# Patient Record
Sex: Male | Born: 1960
Health system: Southern US, Community
[De-identification: ages and names within clinical notes are randomized; demographics above are authoritative.]

## PROBLEM LIST (undated history)

## (undated) DIAGNOSIS — N200 Calculus of kidney: Secondary | ICD-10-CM

## (undated) DIAGNOSIS — I10 Essential (primary) hypertension: Secondary | ICD-10-CM

## (undated) DIAGNOSIS — N390 Urinary tract infection, site not specified: Secondary | ICD-10-CM

## (undated) DIAGNOSIS — N189 Chronic kidney disease, unspecified: Secondary | ICD-10-CM

## (undated) DIAGNOSIS — J069 Acute upper respiratory infection, unspecified: Secondary | ICD-10-CM

## (undated) DIAGNOSIS — K5790 Diverticulosis of intestine, part unspecified, without perforation or abscess without bleeding: Secondary | ICD-10-CM

## (undated) DIAGNOSIS — M5030 Other cervical disc degeneration, unspecified cervical region: Secondary | ICD-10-CM

## (undated) DIAGNOSIS — G8929 Other chronic pain: Secondary | ICD-10-CM

## (undated) DIAGNOSIS — E785 Hyperlipidemia, unspecified: Secondary | ICD-10-CM

## (undated) DIAGNOSIS — M549 Dorsalgia, unspecified: Secondary | ICD-10-CM

## (undated) HISTORY — PX: KNEE ARTHROSCOPY: SUR90

## (undated) HISTORY — DX: Acute upper respiratory infection, unspecified: J06.9

## (undated) HISTORY — PX: OTHER SURGICAL HISTORY: SHX169

## (undated) HISTORY — DX: Hyperlipidemia, unspecified: E78.5

## (undated) HISTORY — DX: Other cervical disc degeneration, unspecified cervical region: M50.30

## (undated) HISTORY — DX: Diverticulosis of intestine, part unspecified, without perforation or abscess without bleeding: K57.90

## (undated) HISTORY — DX: Essential (primary) hypertension: I10

---

## 1998-07-21 ENCOUNTER — Encounter: Payer: Self-pay | Admitting: *Deleted

## 1998-07-23 ENCOUNTER — Encounter: Payer: Self-pay | Admitting: *Deleted

## 1998-07-23 ENCOUNTER — Ambulatory Visit (HOSPITAL_COMMUNITY): Admission: RE | Admit: 1998-07-23 | Discharge: 1998-07-24 | Payer: Self-pay | Admitting: *Deleted

## 1998-09-03 ENCOUNTER — Encounter: Admission: RE | Admit: 1998-09-03 | Discharge: 1998-09-19 | Payer: Self-pay | Admitting: *Deleted

## 2002-03-12 HISTORY — PX: BACK SURGERY: SHX140

## 2004-07-06 ENCOUNTER — Encounter: Admission: RE | Admit: 2004-07-06 | Discharge: 2004-08-24 | Payer: Self-pay | Admitting: Orthopedic Surgery

## 2009-08-04 ENCOUNTER — Encounter (INDEPENDENT_AMBULATORY_CARE_PROVIDER_SITE_OTHER): Payer: Self-pay | Admitting: *Deleted

## 2009-09-18 ENCOUNTER — Encounter (INDEPENDENT_AMBULATORY_CARE_PROVIDER_SITE_OTHER): Payer: Self-pay | Admitting: *Deleted

## 2009-09-19 ENCOUNTER — Ambulatory Visit: Payer: Self-pay | Admitting: Gastroenterology

## 2009-09-19 ENCOUNTER — Encounter (INDEPENDENT_AMBULATORY_CARE_PROVIDER_SITE_OTHER): Payer: Self-pay | Admitting: *Deleted

## 2009-10-03 ENCOUNTER — Ambulatory Visit: Payer: Self-pay | Admitting: Gastroenterology

## 2009-10-08 ENCOUNTER — Encounter: Payer: Self-pay | Admitting: Gastroenterology

## 2010-02-10 NOTE — Letter (Signed)
Summary: Patient Notice-Hyperplastic Polyps  [facility]  [address] [place], [place] [address]   Phone: [phone]  Fax: [phone]        October 08, 2009 MRN: [id number]    Cameron Noble [address] [place], [place]  [address]    Dear [name],  I am pleased to inform you that the colon polyp(s) removed during your recent colonoscopy was (were) found to be hyperplastic. These types of polyps are NOT pre-cancerous.  It is my recommendation that you have a repeat colonoscopy examination in 10 years for routine colorectal cancer screening.  Should you develop new or worsening symptoms of abdominal pain, bowel habit changes or bleeding from the rectum or bowels, please schedule an evaluation with either your primary care physician or with me.  Continue treatment plan as outlined the day of your exam.  Please call us if you are having persistent problems or have questions about your condition that have not been fully answered at this time.  Sincerely,  Meryl Dare MD [facility]  This letter has been electronically signed by your physician.  Appended Document: Patient Notice-Hyperplastic Polyps letter mailed

## 2010-02-10 NOTE — Letter (Signed)
Summary: Diabetic Instructions  [facility]  [address] [place], [place] [address]   Phone: [phone]  Fax: [phone]    Cameron Noble 12-Dec-1960 MRN: [id number]   (METFORMIN)  ORAL DIABETIC MEDICATION INSTRUCTIONS  The day before your procedure:   Take your diabetic pill as you do normally  The day of your procedure:   Do not take your diabetic pill    We will check your blood sugar levels during the admission process and again in Recovery before discharging you home  ________________________________________________________________________

## 2010-02-10 NOTE — Miscellaneous (Signed)
Summary: [facility]  Clinical Lists Changes  Medications: Added new medication of MOVIPREP 100 GM  SOLR (PEG-KCL-NACL-NASULF-NA ASC-C) As per prep instructions. - Signed Rx of MOVIPREP 100 GM  SOLR (PEG-KCL-NACL-NASULF-NA ASC-C) As per prep instructions.;  #1 x 0;  Signed;  Entered by: Ezra Sites RN;  Authorized by: Meryl Dare MD Clementeen Graham;  Method used: Electronically to [facility] [phone]*, [address], [place], [place], [place]  [address], Ph: [phone] or [phone], Fax: [phone] Observations: Added new observation of NKA: T (09/19/2009 7:55)    Prescriptions: MOVIPREP 100 GM  SOLR (PEG-KCL-NACL-NASULF-NA ASC-C) As per prep instructions.  #1 x 0   Entered by:   Ezra Sites RN   Authorized by:   Meryl Dare MD [facility]   Signed by:   Ezra Sites RN on 09/19/2009   Method used:   Electronically to        [facility] [phone]* (retail)       [address]       [place], [place]  [address]       Ph: [phone] or [phone]       Fax: [phone]   RxID:   [id number]

## 2010-02-10 NOTE — Procedures (Signed)
Summary: Colonoscopy  Patient: Cameron Noble Note: All result statuses are Final unless otherwise noted.  Tests: (1) Colonoscopy (COL)   COL Colonoscopy           DONE     [facility]     [address]     [place], [place]  [address]           COLONOSCOPY PROCEDURE REPORT     PATIENT:  Cameron Noble, Cameron Noble  MR#:  [id number]     BIRTHDATE:  [birth year], 49 yrs. old  GENDER:  male     ENDOSCOPIST:  Judie Petit T. Russella Dar, MD, [facility]     Referred by:  Rudi Heap, M.D.     PROCEDURE DATE:  10/03/2009     PROCEDURE:  Colonoscopy with biopsy     ASA CLASS:  Class II     INDICATIONS:  1) Routine Risk Screening     MEDICATIONS:   Fentanyl 75 mcg IV, Versed 9 mg IV     DESCRIPTION OF PROCEDURE:   After the risks benefits and     alternatives of the procedure were thoroughly explained, informed     consent was obtained.  Digital rectal exam was performed and     revealed no abnormalities.   The LB PCF-[id number] endoscope     was introduced through the anus and advanced to the cecum, which     was identified by both the appendix and ileocecal valve, without     limitations.  The quality of the prep was excellent, using     MoviPrep.  The instrument was then slowly withdrawn as the colon     was fully examined.     <<PROCEDUREIMAGES>>     FINDINGS:  Mild diverticulosis was found in the ascending colon.     Mild diverticulosis was found in the sigmoid colon. A 4 mm sessile     polyp was found in the mid transverse colon and removed by cold     biopsy.  A normal appearing cecum, ileocecal valve, and     appendiceal orifice were identified. The hepatic flexure, splenic     flexure, descending colon, and rectum appeared unremarkable.     Retroflexed views in the rectum revealed no abnormalities.  The     time to cecum = 2.75  minutes. The scope was then withdrawn (time     = 10  min) from the patient and the procedure completed.           COMPLICATIONS:  None           ENDOSCOPIC  IMPRESSION:     1) Mild diverticulosis in the ascending colon     2) Mild diverticulosis in the sigmoid colon     3) 4mm transverse colon polyp           RECOMMENDATIONS:     1) Await pathology results     2) High fiber diet with liberal fluid intake.     3) If the polyp removed today is adenomatous (pre-cancerous),     you will need a repeat colonoscopy in 5 years. Otherwise follow     colorectal cancer screening for "routine risk" patients with     colonoscopy in 10 years.           Venita Lick. Russella Dar, MD, [facility]           n.     eSIGNED:   Venita Lick. Stark at 10/03/2009 02:02 PM  Leslee, [place], [id number]  Note: An exclamation mark (!) indicates a result that was not dispersed into the flowsheet. Document Creation Date: 10/03/2009 2:01 PM _______________________________________________________________________  (1) Order result status: Final Collection or observation date-time: 10/03/2009 13:55 Requested date-time:  Receipt date-time:  Reported date-time:  Referring Physician:   Ordering Physician: Claudette Head [phone]) Specimen Source:  Source: Launa Grill Order Number: [id number] Lab site:   Appended Document: Colonoscopy     Procedures Next Due Date:    Colonoscopy: 09/2019

## 2010-02-10 NOTE — Letter (Signed)
Summary: [facility] Instructions  [facility]  [address] [place], [place] [address]   Phone: [phone]  Fax: [phone]       Cameron Noble    [birth year]    MRN: [id number]        Procedure Day /Date:  Friday 10/03/2009     Arrival Time: 12:30 pm      Procedure Time: 1:30 pm     Location of Procedure:                    _x _  [facility] (4th Floor)                        PREPARATION FOR COLONOSCOPY WITH MOVIPREP   Starting 5 days prior to your procedure Sunday 9/18 do not eat nuts, seeds, popcorn, corn, beans, peas,  salads, or any raw vegetables.  Do not take any fiber supplements (e.g. Metamucil, Citrucel, and Benefiber).  THE DAY BEFORE YOUR PROCEDURE         DATE: Thursday 9/22  1.  Drink clear liquids the entire day-NO SOLID FOOD  2.  Do not drink anything colored red or purple.  Avoid juices with pulp.  No orange juice.  3.  Drink at least 64 oz. (8 glasses) of fluid/clear liquids during the day to prevent dehydration and help the prep work efficiently.  CLEAR LIQUIDS INCLUDE: Water Jello Ice Popsicles Tea (sugar ok, no milk/cream) Powdered fruit flavored drinks Coffee (sugar ok, no milk/cream) Gatorade Juice: apple, white grape, white cranberry  Lemonade Clear bullion, consomm, broth Carbonated beverages (any kind) Strained chicken noodle soup Hard Candy                             4.  In the morning, mix first dose of MoviPrep solution:    Empty 1 Pouch A and 1 Pouch B into the disposable container    Add lukewarm drinking water to the top line of the container. Mix to dissolve    Refrigerate (mixed solution should be used within 24 hrs)  5.  Begin drinking the prep at 5:00 p.m. The MoviPrep container is divided by 4 marks.   Every 15 minutes drink the solution down to the next mark (approximately 8 oz) until the full liter is complete.   6.  Follow completed prep with 16 oz of clear liquid of your choice (Nothing  red or purple).  Continue to drink clear liquids until bedtime.  7.  Before going to bed, mix second dose of MoviPrep solution:    Empty 1 Pouch A and 1 Pouch B into the disposable container    Add lukewarm drinking water to the top line of the container. Mix to dissolve    Refrigerate  THE DAY OF YOUR PROCEDURE      DATE: Friday 9/23  Beginning at 8:30 a.m. (5 hours before procedure):         1. Every 15 minutes, drink the solution down to the next mark (approx 8 oz) until the full liter is complete.  2. Follow completed prep with 16 oz. of clear liquid of your choice.    3. You may drink clear liquids until 11:30 am (2 HOURS BEFORE PROCEDURE).   MEDICATION INSTRUCTIONS  Unless otherwise instructed, you should take regular prescription medications with a small sip of water   as early as possible the morning of  your procedure.  Diabetic patients - see separate instructions.         OTHER INSTRUCTIONS  You will need a responsible adult at least 52 years of age to accompany you and drive you home.   This person must remain in the waiting room during your procedure.  Wear loose fitting clothing that is easily removed.  Leave jewelry and other valuables at home.  However, you may wish to bring a book to read or  an iPod/MP3 player to listen to music as you wait for your procedure to start.  Remove all body piercing jewelry and leave at home.  Total time from sign-in until discharge is approximately 2-3 hours.  You should go home directly after your procedure and rest.  You can resume normal activities the  day after your procedure.  The day of your procedure you should not:   Drive   Make legal decisions   Operate machinery   Drink alcohol   Return to work  You will receive specific instructions about eating, activities and medications before you leave.    The above instructions have been reviewed and explained to me by   Ezra Sites RN  September 19, 2009 8:16 AM    I fully understand and can verbalize these instructions _____________________________ Date _________

## 2010-02-10 NOTE — Letter (Signed)
Summary: Previsit letter  [name]  [address] [name], [place] [address]   Phone: [phone]  Fax: [phone]       08/04/2009 MRN: [id number]  Cameron Noble [address] [place], [place]  [address]  Dear Mr. [name],  Welcome to the Gastroenterology Division at [facility]    You are scheduled to see a nurse for your pre-procedure visit on 09-04-09 at 8:30A.M.  on the 3rd floor at [facility], [address]  We ask that you try to arrive at our office 15 minutes prior to your appointment time to allow for check-in.  Your nurse visit will consist of discussing your medical and surgical history, your immediate family medical history, and your medications.    Please bring a complete list of all your medications or, if you prefer, bring the medication bottles and we will list them.  We will need to be aware of both prescribed and over the counter drugs.  We will need to know exact dosage information as well.  If you are on blood thinners (Coumadin, Plavix, Aggrenox, Ticlid, etc.) please call our office today/prior to your appointment, as we need to consult with your physician about holding your medication.   Please be prepared to read and sign documents such as consent forms, a financial agreement, and acknowledgement forms.  If necessary, and with your consent, a friend or relative is welcome to sit-in on the nurse visit with you.  Please bring your insurance card so that we may make a copy of it.  If your insurance requires a referral to see a specialist, please bring your referral form from your primary care physician.  No co-pay is required for this nurse visit.     If you cannot keep your appointment, please call [phone] to cancel or reschedule prior to your appointment date.  This allows [place] the opportunity to schedule an appointment for another patient in need of care.    Thank you for choosing [facility] for your medical needs.   We appreciate the opportunity to care for you.  Please visit [place] at our website  to learn more about our practice.                     Sincerely.                                                                                                                   The Gastroenterology Division

## 2010-03-26 LAB — GLUCOSE, CAPILLARY
Glucose-Capillary: 106 mg/dL — ABNORMAL HIGH (ref 70–99)
Glucose-Capillary: 67 mg/dL — ABNORMAL LOW (ref 70–99)
Glucose-Capillary: 77 mg/dL (ref 70–99)

## 2010-06-16 ENCOUNTER — Encounter: Payer: Self-pay | Admitting: Physician Assistant

## 2012-05-04 ENCOUNTER — Encounter: Payer: Self-pay | Admitting: Nurse Practitioner

## 2012-05-04 ENCOUNTER — Ambulatory Visit (INDEPENDENT_AMBULATORY_CARE_PROVIDER_SITE_OTHER): Payer: BC Managed Care – PPO | Admitting: Nurse Practitioner

## 2012-05-04 VITALS — BP 117/79 | HR 62 | Temp 98.4°F | Ht 68.0 in | Wt 202.0 lb

## 2012-05-04 DIAGNOSIS — E785 Hyperlipidemia, unspecified: Secondary | ICD-10-CM

## 2012-05-04 DIAGNOSIS — E782 Mixed hyperlipidemia: Secondary | ICD-10-CM | POA: Insufficient documentation

## 2012-05-04 DIAGNOSIS — E119 Type 2 diabetes mellitus without complications: Secondary | ICD-10-CM

## 2012-05-04 DIAGNOSIS — I10 Essential (primary) hypertension: Secondary | ICD-10-CM | POA: Insufficient documentation

## 2012-05-04 DIAGNOSIS — B86 Scabies: Secondary | ICD-10-CM

## 2012-05-04 LAB — COMPLETE METABOLIC PANEL WITH GFR
CO2: 29 mEq/L (ref 19–32)
Creat: 0.98 mg/dL (ref 0.50–1.35)
GFR, Est African American: >89 mL/min
GFR, Est Non African American: 87 mL/min
Glucose, Bld: 99 mg/dL (ref 70–99)
Total Bilirubin: 0.4 mg/dL (ref 0.3–1.2)

## 2012-05-04 LAB — POCT GLYCOSYLATED HEMOGLOBIN (HGB A1C): Hemoglobin A1C: 5.6

## 2012-05-04 MED ORDER — LISINOPRIL 10 MG PO TABS: 10.0000 mg | ORAL_TABLET | Freq: Every day | ORAL | Status: DC

## 2012-05-04 MED ORDER — PERMETHRIN 5 % EX CREA: TOPICAL_CREAM | Freq: Once | CUTANEOUS | Status: DC

## 2012-05-04 MED ORDER — METFORMIN HCL 1000 MG PO TABS: 1000.0000 mg | ORAL_TABLET | Freq: Two times a day (BID) | ORAL | Status: DC

## 2012-05-04 MED ORDER — ATORVASTATIN CALCIUM 10 MG PO TABS: 10.0000 mg | ORAL_TABLET | Freq: Every day | ORAL | Status: DC

## 2012-05-04 NOTE — Patient Instructions (Addendum)
Health Maintenance, Males A healthy lifestyle and preventative care can promote health and wellness.  Maintain regular health, dental, and eye exams.  Eat a healthy diet. Foods like vegetables, fruits, whole grains, low-fat dairy products, and lean protein foods contain the nutrients you need without too many calories. Decrease your intake of foods high in solid fats, added sugars, and salt. Get information about a proper diet from your caregiver, if necessary.  Regular physical exercise is one of the most important things you can do for your health. Most adults should get at least 150 minutes of moderate-intensity exercise (any activity that increases your heart rate and causes you to sweat) each week. In addition, most adults need muscle-strengthening exercises on 2 or more days a week.   Maintain a healthy weight. The body mass index (BMI) is a screening tool to identify possible weight problems. It provides an estimate of body fat based on height and weight. Your caregiver can help determine your BMI, and can help you achieve or maintain a healthy weight. For adults 20 years and older:  A BMI below 18.5 is considered underweight.  A BMI of 18.5 to 24.9 is normal.  A BMI of 25 to 29.9 is considered overweight.  A BMI of 30 and above is considered obese.  Maintain normal blood lipids and cholesterol by exercising and minimizing your intake of saturated fat. Eat a balanced diet with plenty of fruits and vegetables. Blood tests for lipids and cholesterol should begin at age 20 and be repeated every 5 years. If your lipid or cholesterol levels are high, you are over 50, or you are a high risk for heart disease, you may need your cholesterol levels checked more frequently.Ongoing high lipid and cholesterol levels should be treated with medicines, if diet and exercise are not effective.  If you smoke, find out from your caregiver how to quit. If you do not use tobacco, do not start.  If you  choose to drink alcohol, do not exceed 2 drinks per day. One drink is considered to be 12 ounces (355 mL) of beer, 5 ounces (148 mL) of wine, or 1.5 ounces (44 mL) of liquor.  Avoid use of street drugs. Do not share needles with anyone. Ask for help if you need support or instructions about stopping the use of drugs.  High blood pressure causes heart disease and increases the risk of stroke. Blood pressure should be checked at least every 1 to 2 years. Ongoing high blood pressure should be treated with medicines if weight loss and exercise are not effective.  If you are 45 to 54 years old, ask your caregiver if you should take aspirin to prevent heart disease.  Diabetes screening involves taking a blood sample to check your fasting blood sugar level. This should be done once every 3 years, after age 45, if you are within normal weight and without risk factors for diabetes. Testing should be considered at a younger age or be carried out more frequently if you are overweight and have at least 1 risk factor for diabetes.  Colorectal cancer can be detected and often prevented. Most routine colorectal cancer screening begins at the age of 50 and continues through age 75. However, your caregiver may recommend screening at an earlier age if you have risk factors for colon cancer. On a yearly basis, your caregiver may provide home test kits to check for hidden blood in the stool. Use of a small camera at the end of a tube,   to directly examine the colon (sigmoidoscopy or colonoscopy), can detect the earliest forms of colorectal cancer. Talk to your caregiver about this at age 72, when routine screening begins. Direct examination of the colon should be repeated every 5 to 10 years through age 54, unless early forms of pre-cancerous polyps or small growths are found.  Hepatitis C blood testing is recommended for all people born from [birth year] through 1967 and any individual with known risks for hepatitis C.  Healthy  men should no longer receive prostate-specific antigen (PSA) blood tests as part of routine cancer screening. Consult with your caregiver about prostate cancer screening.  Testicular cancer screening is not recommended for adolescents or adult males who have no symptoms. Screening includes self-exam, caregiver exam, and other screening tests. Consult with your caregiver about any symptoms you have or any concerns you have about testicular cancer.  Practice safe sex. Use condoms and avoid high-risk sexual practices to reduce the spread of sexually transmitted infections (STIs).  Use sunscreen with a sun protection factor (SPF) of 30 or greater. Apply sunscreen liberally and repeatedly throughout the day. You should seek shade when your shadow is shorter than you. Protect yourself by wearing long sleeves, pants, a wide-brimmed hat, and sunglasses year round, whenever you are outdoors.  Notify your caregiver of new moles or changes in moles, especially if there is a change in shape or color. Also notify your caregiver if a mole is larger than the size of a pencil eraser.  A one-time screening for abdominal aortic aneurysm (AAA) and surgical repair of large AAAs by sound wave imaging (ultrasonography) is recommended for ages 3 to 43 years who are current or former smokers.  Stay current with your immunizations. Document Released: 06/26/2007 Document Revised: 03/22/2011 Document Reviewed: 05/25/2010 [facility] Patient Information 2013 [place], [place]. Scabies Scabies are small bugs (mites) that burrow under the skin and cause red bumps and severe itching. These bugs can only be seen with a microscope. Scabies are highly contagious. They can spread easily from person to person by direct contact. They are also spread through sharing clothing or linens that have the scabies mites living in them. It is not unusual for an entire family to become infected through shared towels, clothing, or bedding.  HOME CARE  INSTRUCTIONS   Your caregiver may prescribe a cream or lotion to kill the mites. If cream is prescribed, massage the cream into the entire body from the neck to the bottom of both feet. Also massage the cream into the scalp and face if your child is less than 30 year old. Avoid the eyes and mouth. Do not wash your hands after application.  Leave the cream on for 8 to 12 hours. Your child should bathe or shower after the 8 to 12 hour application period. Sometimes it is helpful to apply the cream to your child right before bedtime.  One treatment is usually effective and will eliminate approximately 95% of infestations. For severe cases, your caregiver may decide to repeat the treatment in 1 week. Everyone in your household should be treated with one application of the cream.  New rashes or burrows should not appear within 24 to 48 hours after successful treatment. However, the itching and rash may last for 2 to 4 weeks after successful treatment. Your caregiver may prescribe a medicine to help with the itching or to help the rash go away more quickly.  Scabies can live on clothing or linens for up to 3 days. All of  your child's recently used clothing, towels, stuffed toys, and bed linens should be washed in hot water and then dried in a dryer for at least 20 minutes on high heat. Items that cannot be washed should be enclosed in a plastic bag for at least 3 days.  To help relieve itching, bathe your child in a cool bath or apply cool washcloths to the affected areas.  Your child may return to school after treatment with the prescribed cream. SEEK MEDICAL CARE IF:   The itching persists longer than 4 weeks after treatment.  The rash spreads or becomes infected. Signs of infection include red blisters or yellow-tan crust. Document Released: 12/28/2004 Document Revised: 03/22/2011 Document Reviewed: 05/08/2008 [facility] Patient Information 2013 [place], [place].

## 2012-05-04 NOTE — Progress Notes (Deleted)
  Subjective:    Patient ID: Cameron Noble, male    DOB: 11-09-1960, 52 y.o.   MRN: [id number]  Hypertension  Diabetes  Hyperlipidemia      Review of Systems     Objective:   Physical Exam        Assessment & Plan:

## 2012-05-04 NOTE — Progress Notes (Signed)
Subjective:    Patient ID: Cameron Noble, male    DOB: Aug 21, 1960, 52 y.o.   MRN: [id number]  Hypertension This is a chronic problem. The current episode started more than 1 year ago. The problem has been gradually improving since onset. The problem is controlled. Pertinent negatives include no chest pain, headaches, palpitations, peripheral edema or shortness of breath. Risk factors for coronary artery disease include dyslipidemia, diabetes mellitus and male gender. Past treatments include ACE inhibitors. The current treatment provides significant improvement. There are no compliance problems.   Diabetes He has type 2 diabetes mellitus. His disease course has been stable. There are no hypoglycemic associated symptoms. Pertinent negatives for hypoglycemia include no headaches. Pertinent negatives for diabetes include no chest pain, no foot paresthesias and no visual change. There are no hypoglycemic complications. Symptoms are stable. There are no diabetic complications. Risk factors for coronary artery disease include dyslipidemia, male sex and hypertension. Current diabetic treatment includes oral agent (monotherapy). He is compliant with treatment all of the time. His weight is stable. He is following a generally healthy diet. When asked about meal planning, he reported none. He participates in exercise three times a week. An ACE inhibitor/angiotensin II receptor blocker is being taken. Eye exam is current.  Hyperlipidemia This is a chronic problem. The current episode started more than 1 year ago. The problem is controlled. Recent lipid tests were reviewed and are normal. Exacerbating diseases include diabetes. Pertinent negatives include no chest pain, leg pain, myalgias or shortness of breath. Current antihyperlipidemic treatment includes statins. The current treatment provides significant improvement of lipids. There are no compliance problems.  Risk factors for coronary artery disease include  diabetes mellitus, hypertension and male sex.      Review of Systems  Respiratory: Negative for shortness of breath.   Cardiovascular: Negative for chest pain and palpitations.  Musculoskeletal: Negative for myalgias.  Neurological: Negative for headaches.  All other systems reviewed and are negative.       Objective:   Physical Exam  Constitutional: He is oriented to person, place, and time. He appears well-developed and well-nourished.  HENT:  Head: Normocephalic.  Right Ear: External ear normal.  Left Ear: External ear normal.  Eyes: Conjunctivae are normal. Pupils are equal, round, and reactive to light.  Neck: Normal range of motion. Neck supple. No JVD present.  Cardiovascular: Normal rate, regular rhythm and normal heart sounds.   No murmur heard. Pulmonary/Chest: Effort normal and breath sounds normal.  Abdominal: Soft. Bowel sounds are normal.  Musculoskeletal: Normal range of motion.  Neurological: He is alert and oriented to person, place, and time.  Skin: Skin is warm and dry.  +4/4 monofilament bil No edema    Psychiatric: He has a normal mood and affect. His behavior is normal. Judgment and thought content normal.   Erythematous, rash in linear pattern between fingers and toes  BP 117/79  Pulse 62  Temp(Src) 98.4 F (36.9 C) (Oral)  Ht 5\' 8"  (1.727 m)  Wt 202 lb (91.627 kg)  BMI 30.72 kg/m2   Results for orders placed in visit on 05/04/12  POCT GLYCOSYLATED HEMOGLOBIN (HGB A1C)      Result Value Range   Hemoglobin A1C 5.6            Assessment & Plan:  1. Essential hypertension, benign Low Na+ Exercise - COMPLETE METABOLIC PANEL WITH GFR - lisinopril (PRINIVIL,ZESTRIL) 10 MG tablet; Take 1 tablet (10 mg total) by mouth daily.  Dispense: 90 tablet; Refill: 1  2. Other and unspecified hyperlipidemia Low fat diet  - NMR Lipoprofile with Lipids - atorvastatin (LIPITOR) 10 MG tablet; Take 1 tablet (10 mg total) by mouth daily.  Dispense: 90  tablet; Refill: 1  3. Type II or unspecified type diabetes mellitus without mention of complication, not stated as uncontrolled Low carb diet Exercise Check feet daily - Hemoglobin A1c - metFORMIN (GLUCOPHAGE) 1000 MG tablet; Take 1 tablet (1,000 mg total) by mouth 2 (two) times daily with a meal.  Dispense: 180 tablet; Refill: 1  4. Scabies Avoid scratching Wash sheets in bed Permethrin 0.1% cream  Mary-Margaret Daphine Deutscher, FNP

## 2012-05-05 LAB — NMR LIPOPROFILE WITH LIPIDS
HDL Size: 8.9 nm — ABNORMAL LOW (ref >=9.2)
HDL-C: 38 mg/dL — ABNORMAL LOW (ref >=40)
LDL (calc): 40 mg/dL (ref <100)
LDL Size: 19.9 nm — ABNORMAL LOW (ref >20.5)
LP-IR Score: 50 — ABNORMAL HIGH (ref <=45)

## 2012-08-15 ENCOUNTER — Ambulatory Visit: Payer: BC Managed Care – PPO | Admitting: Nurse Practitioner

## 2012-09-01 ENCOUNTER — Ambulatory Visit (INDEPENDENT_AMBULATORY_CARE_PROVIDER_SITE_OTHER): Payer: BC Managed Care – PPO | Admitting: Nurse Practitioner

## 2012-09-01 ENCOUNTER — Encounter: Payer: Self-pay | Admitting: Nurse Practitioner

## 2012-09-01 VITALS — BP 103/70 | HR 58 | Temp 96.8°F | Ht 68.0 in | Wt 201.0 lb

## 2012-09-01 DIAGNOSIS — E119 Type 2 diabetes mellitus without complications: Secondary | ICD-10-CM

## 2012-09-01 DIAGNOSIS — I10 Essential (primary) hypertension: Secondary | ICD-10-CM

## 2012-09-01 DIAGNOSIS — E785 Hyperlipidemia, unspecified: Secondary | ICD-10-CM

## 2012-09-01 LAB — POCT GLYCOSYLATED HEMOGLOBIN (HGB A1C): Hemoglobin A1C: 5.8

## 2012-09-01 LAB — POCT UA - MICROALBUMIN: Microalbumin Ur, POC: NEGATIVE mg/L

## 2012-09-01 NOTE — Patient Instructions (Signed)

## 2012-09-01 NOTE — Progress Notes (Signed)
Subjective:    Patient ID: Cameron Noble, male    DOB: [birth year], 52 y.o.   MRN: [id number]  Hypertension This is a chronic problem. The current episode started more than 1 year ago. The problem has been gradually improving since onset. The problem is controlled. Pertinent negatives include no chest pain, headaches, palpitations, peripheral edema or shortness of breath. Risk factors for coronary artery disease include dyslipidemia, diabetes mellitus and male gender. Past treatments include ACE inhibitors. The current treatment provides significant improvement. There are no compliance problems.   Diabetes He has type 2 diabetes mellitus. His disease course has been stable. There are no hypoglycemic associated symptoms. Pertinent negatives for hypoglycemia include no headaches. Pertinent negatives for diabetes include no chest pain, no foot paresthesias and no visual change. There are no hypoglycemic complications. Symptoms are stable. There are no diabetic complications. Risk factors for coronary artery disease include dyslipidemia, male sex and hypertension. Current diabetic treatment includes oral agent (monotherapy). He is compliant with treatment all of the time. His weight is stable. He is following a generally healthy diet. When asked about meal planning, he reported none. He participates in exercise three times a week. (Patient doesn't check blood sugars at home) An ACE inhibitor/angiotensin II receptor blocker is being taken. Eye exam is current.  Hyperlipidemia This is a chronic problem. The current episode started more than 1 year ago. The problem is controlled. Recent lipid tests were reviewed and are normal. Exacerbating diseases include diabetes. Pertinent negatives include no chest pain, leg pain, myalgias or shortness of breath. Current antihyperlipidemic treatment includes statins. The current treatment provides significant improvement of lipids. There are no compliance problems.  Risk  factors for coronary artery disease include diabetes mellitus, hypertension and male sex.      Review of Systems  Respiratory: Negative for shortness of breath.   Cardiovascular: Negative for chest pain and palpitations.  Musculoskeletal: Negative for myalgias.  Neurological: Negative for headaches.  All other systems reviewed and are negative.       Objective:   Physical Exam  Constitutional: He is oriented to person, place, and time. He appears well-developed and well-nourished.  HENT:  Head: Normocephalic.  Right Ear: External ear normal.  Left Ear: External ear normal.  Eyes: Conjunctivae are normal. Pupils are equal, round, and reactive to light.  Neck: Normal range of motion. Neck supple. No JVD present.  Cardiovascular: Normal rate, regular rhythm and normal heart sounds.   No murmur heard. Pulmonary/Chest: Effort normal and breath sounds normal.  Abdominal: Soft. Bowel sounds are normal.  Musculoskeletal: Normal range of motion.  Neurological: He is alert and oriented to person, place, and time.  Skin: Skin is warm and dry.  +4/4 monofilament bil No edema    Psychiatric: He has a normal mood and affect. His behavior is normal. Judgment and thought content normal.   Erythematous, rash in linear pattern between fingers and toes  BP 103/70  Pulse 58  Temp(Src) 96.8 F (36 C) (Oral)  Ht 5\' 8"  (1.727 m)  Wt 201 lb (91.173 kg)  BMI 30.57 kg/m2  Results for orders placed in visit on 09/01/12  POCT GLYCOSYLATED HEMOGLOBIN (HGB A1C)      Result Value Range   Hemoglobin A1C 5.8%         Assessment & Plan:  1. Essential hypertension, benign Low Na+ Exercise - COMPLETE METABOLIC PANEL WITH GFR  2. Other and unspecified hyperlipidemia Low fat diet  - NMR Lipoprofile with Lipids  -3. Type  II or unspecified type diabetes mellitus without mention of complication, not stated as uncontrolled Low carb diet Exercise Check feet daily - Hemoglobin  A1c   Mary-Margaret Daphine Deutscher, FNP

## 2012-09-03 LAB — CMP14+EGFR
Albumin/Globulin Ratio: 1.6 (ref 1.1–2.5)
Albumin: 4 g/dL (ref 3.5–5.5)
Alkaline Phosphatase: 76 IU/L (ref 39–117)
BUN/Creatinine Ratio: 15 (ref 9–20)
BUN: 17 mg/dL (ref 6–24)
Chloride: 104 mmol/L (ref 97–108)
Creatinine, Ser: 1.1 mg/dL (ref 0.76–1.27)
GFR calc Af Amer: 88 mL/min/{1.73_m2} (ref >59)
GFR calc non Af Amer: 76 mL/min/{1.73_m2} (ref >59)
Globulin, Total: 2.5 g/dL (ref 1.5–4.5)
Glucose: 89 mg/dL (ref 65–99)
Total Bilirubin: 0.6 mg/dL (ref 0.0–1.2)
Total Protein: 6.5 g/dL (ref 6.0–8.5)

## 2012-09-03 LAB — NMR, LIPOPROFILE
HDL Cholesterol by NMR: 40 mg/dL (ref >=40)
LDL Particle Number: 902 nmol/L (ref <1000)
LDLC SERPL CALC-MCNC: 48 mg/dL (ref <100)
Triglycerides by NMR: 38 mg/dL (ref <150)

## 2012-12-04 ENCOUNTER — Ambulatory Visit: Payer: BC Managed Care – PPO | Admitting: Nurse Practitioner

## 2012-12-19 ENCOUNTER — Other Ambulatory Visit: Payer: Self-pay | Admitting: Nurse Practitioner

## 2012-12-27 ENCOUNTER — Encounter: Payer: Self-pay | Admitting: Nurse Practitioner

## 2012-12-27 ENCOUNTER — Ambulatory Visit (INDEPENDENT_AMBULATORY_CARE_PROVIDER_SITE_OTHER): Payer: BC Managed Care – PPO | Admitting: Nurse Practitioner

## 2012-12-27 VITALS — BP 105/65 | HR 69 | Temp 99.1°F | Ht 68.0 in | Wt 203.0 lb

## 2012-12-27 DIAGNOSIS — E785 Hyperlipidemia, unspecified: Secondary | ICD-10-CM

## 2012-12-27 DIAGNOSIS — I10 Essential (primary) hypertension: Secondary | ICD-10-CM

## 2012-12-27 DIAGNOSIS — Z23 Encounter for immunization: Secondary | ICD-10-CM

## 2012-12-27 DIAGNOSIS — E119 Type 2 diabetes mellitus without complications: Secondary | ICD-10-CM

## 2012-12-27 LAB — POCT GLYCOSYLATED HEMOGLOBIN (HGB A1C): Hemoglobin A1C: 5.2

## 2012-12-27 MED ORDER — LISINOPRIL 10 MG PO TABS: 10.0000 mg | ORAL_TABLET | Freq: Every day | ORAL | Status: DC

## 2012-12-27 NOTE — Patient Instructions (Signed)
Diabetes and Foot Care Diabetes may cause you to have problems because of poor blood supply (circulation) to your feet and legs. This may cause the skin on your feet to become thinner, break easier, and heal more slowly. Your skin may become dry, and the skin may peel and crack. You may also have nerve damage in your legs and feet causing decreased feeling in them. You may not notice minor injuries to your feet that could lead to infections or more serious problems. Taking care of your feet is one of the most important things you can do for yourself.  HOME CARE INSTRUCTIONS  Wear shoes at all times, even in the house. Do not go barefoot. Bare feet are easily injured.  Check your feet daily for blisters, cuts, and redness. If you cannot see the bottom of your feet, use a mirror or ask someone for help.  Wash your feet with warm water (do not use hot water) and mild soap. Then pat your feet and the areas between your toes until they are completely dry. Do not soak your feet as this can dry your skin.  Apply a moisturizing lotion or petroleum jelly (that does not contain alcohol and is unscented) to the skin on your feet and to dry, brittle toenails. Do not apply lotion between your toes.  Trim your toenails straight across. Do not dig under them or around the cuticle. File the edges of your nails with an emery board or nail file.  Do not cut corns or calluses or try to remove them with medicine.  Wear clean socks or stockings every day. Make sure they are not too tight. Do not wear knee-high stockings since they may decrease blood flow to your legs.  Wear shoes that fit properly and have enough cushioning. To break in new shoes, wear them for just a few hours a day. This prevents you from injuring your feet. Always look in your shoes before you put them on to be sure there are no objects inside.  Do not cross your legs. This may decrease the blood flow to your feet.  If you find a minor scrape,  cut, or break in the skin on your feet, keep it and the skin around it clean and dry. These areas may be cleansed with mild soap and water. Do not cleanse the area with peroxide, alcohol, or iodine.  When you remove an adhesive bandage, be sure not to damage the skin around it.  If you have a wound, look at it several times a day to make sure it is healing.  Do not use heating pads or hot water bottles. They may burn your skin. If you have lost feeling in your feet or legs, you may not know it is happening until it is too late.  Make sure your health care provider performs a complete foot exam at least annually or more often if you have foot problems. Report any cuts, sores, or bruises to your health care provider immediately. SEEK MEDICAL CARE IF:   You have an injury that is not healing.  You have cuts or breaks in the skin.  You have an ingrown nail.  You notice redness on your legs or feet.  You feel burning or tingling in your legs or feet.  You have pain or cramps in your legs and feet.  Your legs or feet are numb.  Your feet always feel cold. SEEK IMMEDIATE MEDICAL CARE IF:   There is increasing redness,   swelling, or pain in or around a wound.  There is a red line that goes up your leg.  Pus is coming from a wound.  You develop a fever or as directed by your health care provider.  You notice a bad smell coming from an ulcer or wound. Document Released: 12/26/1999 Document Revised: 08/30/2012 Document Reviewed: 06/06/2012 [employer] Patient Information 2014 [employer].  

## 2012-12-27 NOTE — Progress Notes (Signed)
Subjective:    Patient ID: Cameron Noble, male    DOB: [birth year], 52 y.o.   MRN: [id number]  Patient in today for follow up of chronic medical problems- no changes since last visit.- no complaints today.  Hypertension This is a chronic problem. The current episode started more than 1 year ago. The problem has been gradually improving since onset. The problem is controlled. Pertinent negatives include no chest pain, headaches, palpitations, peripheral edema or shortness of breath. Risk factors for coronary artery disease include dyslipidemia, diabetes mellitus and male gender. Past treatments include ACE inhibitors. The current treatment provides significant improvement. There are no compliance problems.   Diabetes He has type 2 diabetes mellitus. His disease course has been stable. There are no hypoglycemic associated symptoms. Pertinent negatives for hypoglycemia include no headaches. Pertinent negatives for diabetes include no chest pain, no foot paresthesias and no visual change. There are no hypoglycemic complications. Symptoms are stable. There are no diabetic complications. Risk factors for coronary artery disease include dyslipidemia, male sex and hypertension. Current diabetic treatment includes oral agent (monotherapy). He is compliant with treatment all of the time. His weight is stable. He is following a generally healthy diet. When asked about meal planning, he reported none. He participates in exercise three times a week. (Patient doesn't check blood sugars at home )  An ACE inhibitor/angiotensin II receptor blocker is being taken. Eye exam is current.  Hyperlipidemia This is a chronic problem. The current episode started more than 1 year ago. The problem is controlled. Recent lipid tests were reviewed and are normal. Exacerbating diseases include diabetes. Pertinent negatives include no chest pain, leg pain, myalgias or shortness of breath. Current antihyperlipidemic treatment includes  statins. The current treatment provides significant improvement of lipids. There are no compliance problems.  Risk factors for coronary artery disease include diabetes mellitus, hypertension and male sex.      Review of Systems  Respiratory: Negative for shortness of breath.   Cardiovascular: Negative for chest pain and palpitations.  Musculoskeletal: Negative for myalgias.  Neurological: Negative for headaches.  All other systems reviewed and are negative.       Objective:   Physical Exam  Constitutional: He is oriented to person, place, and time. He appears well-developed and well-nourished.  HENT:  Head: Normocephalic.  Right Ear: External ear normal.  Left Ear: External ear normal.  Eyes: Conjunctivae are normal. Pupils are equal, round, and reactive to light.  Neck: Normal range of motion. Neck supple. No JVD present.  Cardiovascular: Normal rate, regular rhythm and normal heart sounds.   No murmur heard. Pulmonary/Chest: Effort normal and breath sounds normal.  Abdominal: Soft. Bowel sounds are normal.  Musculoskeletal: Normal range of motion.  Neurological: He is alert and oriented to person, place, and time.  Skin: Skin is warm and dry.  +4/4 monofilament bil No edema    Psychiatric: He has a normal mood and affect. His behavior is normal. Judgment and thought content normal.    BP 105/65  Pulse 69  Temp(Src) 99.1 F (37.3 C) (Oral)  Ht 5\' 8"  (1.727 m)  Wt 203 lb (92.08 kg)  BMI 30.87 kg/m2 Results for orders placed in visit on 12/27/12  POCT GLYCOSYLATED HEMOGLOBIN (HGB A1C)      Result Value Range   Hemoglobin A1C 5.2%         Assessment & Plan:   1. Essential hypertension, benign   2. Other and unspecified hyperlipidemia   3. Type II or unspecified type  diabetes mellitus without mention of complication, not stated as uncontrolled   4. Need for pneumococcal vaccination    Orders Placed This Encounter  Procedures  . CMP14+EGFR  . NMR, lipoprofile   . POCT glycosylated hemoglobin (Hb A1C)     Continue all meds Labs pending Diet and exercise encouraged Health maintenance reviewed Follow up in 3 months Pneumonia vaccine today  Mary-Margaret Daphine Deutscher, FNP

## 2012-12-29 LAB — CMP14+EGFR
ALT: 19 IU/L (ref 0–44)
Albumin/Globulin Ratio: 1.8 (ref 1.1–2.5)
BUN/Creatinine Ratio: 16 (ref 9–20)
CO2: 22 mmol/L (ref 18–29)
Chloride: 103 mmol/L (ref 97–108)
GFR calc Af Amer: 74 mL/min/{1.73_m2} (ref >59)
GFR calc non Af Amer: 64 mL/min/{1.73_m2} (ref >59)
Glucose: 94 mg/dL (ref 65–99)
Potassium: 4.6 mmol/L (ref 3.5–5.2)
Total Bilirubin: 0.4 mg/dL (ref 0.0–1.2)
Total Protein: 6.5 g/dL (ref 6.0–8.5)

## 2012-12-29 LAB — NMR, LIPOPROFILE
LDL Particle Number: 476 nmol/L (ref <1000)
LDL Size: 21.4 nm (ref >20.5)
LDLC SERPL CALC-MCNC: 44 mg/dL (ref <100)
LP-IR Score: 54 — ABNORMAL HIGH (ref <=45)
Small LDL Particle Number: 352 nmol/L (ref <=527)
Triglycerides by NMR: 67 mg/dL (ref <150)

## 2013-03-19 ENCOUNTER — Other Ambulatory Visit: Payer: Self-pay | Admitting: Nurse Practitioner

## 2013-03-28 ENCOUNTER — Ambulatory Visit: Payer: BC Managed Care – PPO | Admitting: Nurse Practitioner

## 2013-04-23 ENCOUNTER — Ambulatory Visit (INDEPENDENT_AMBULATORY_CARE_PROVIDER_SITE_OTHER): Payer: BC Managed Care – PPO | Admitting: Nurse Practitioner

## 2013-04-23 ENCOUNTER — Encounter: Payer: Self-pay | Admitting: Nurse Practitioner

## 2013-04-23 VITALS — BP 104/71 | HR 71 | Temp 97.7°F | Ht 68.0 in | Wt 195.2 lb

## 2013-04-23 DIAGNOSIS — E119 Type 2 diabetes mellitus without complications: Secondary | ICD-10-CM

## 2013-04-23 DIAGNOSIS — I10 Essential (primary) hypertension: Secondary | ICD-10-CM

## 2013-04-23 DIAGNOSIS — E785 Hyperlipidemia, unspecified: Secondary | ICD-10-CM

## 2013-04-23 LAB — POCT GLYCOSYLATED HEMOGLOBIN (HGB A1C): Hemoglobin A1C: 5.6

## 2013-04-23 MED ORDER — ATORVASTATIN CALCIUM 10 MG PO TABS: ORAL_TABLET | ORAL | Status: DC

## 2013-04-23 MED ORDER — FLUTICASONE PROPIONATE 50 MCG/ACT NA SUSP: 2.0000 | Freq: Every day | NASAL | Status: DC

## 2013-04-23 MED ORDER — LISINOPRIL 10 MG PO TABS: 10.0000 mg | ORAL_TABLET | Freq: Every day | ORAL | Status: DC

## 2013-04-23 MED ORDER — METFORMIN HCL 1000 MG PO TABS: 1000.0000 mg | ORAL_TABLET | Freq: Two times a day (BID) | ORAL | Status: DC

## 2013-04-23 NOTE — Patient Instructions (Signed)
Diabetes and Foot Care Diabetes may cause you to have problems because of poor blood supply (circulation) to your feet and legs. This may cause the skin on your feet to become thinner, break easier, and heal more slowly. Your skin may become dry, and the skin may peel and crack. You may also have nerve damage in your legs and feet causing decreased feeling in them. You may not notice minor injuries to your feet that could lead to infections or more serious problems. Taking care of your feet is one of the most important things you can do for yourself.  HOME CARE INSTRUCTIONS  Wear shoes at all times, even in the house. Do not go barefoot. Bare feet are easily injured.  Check your feet daily for blisters, cuts, and redness. If you cannot see the bottom of your feet, use a mirror or ask someone for help.  Wash your feet with warm water (do not use hot water) and mild soap. Then pat your feet and the areas between your toes until they are completely dry. Do not soak your feet as this can dry your skin.  Apply a moisturizing lotion or petroleum jelly (that does not contain alcohol and is unscented) to the skin on your feet and to dry, brittle toenails. Do not apply lotion between your toes.  Trim your toenails straight across. Do not dig under them or around the cuticle. File the edges of your nails with an emery board or nail file.  Do not cut corns or calluses or try to remove them with medicine.  Wear clean socks or stockings every day. Make sure they are not too tight. Do not wear knee-high stockings since they may decrease blood flow to your legs.  Wear shoes that fit properly and have enough cushioning. To break in new shoes, wear them for just a few hours a day. This prevents you from injuring your feet. Always look in your shoes before you put them on to be sure there are no objects inside.  Do not cross your legs. This may decrease the blood flow to your feet.  If you find a minor scrape,  cut, or break in the skin on your feet, keep it and the skin around it clean and dry. These areas may be cleansed with mild soap and water. Do not cleanse the area with peroxide, alcohol, or iodine.  When you remove an adhesive bandage, be sure not to damage the skin around it.  If you have a wound, look at it several times a day to make sure it is healing.  Do not use heating pads or hot water bottles. They may burn your skin. If you have lost feeling in your feet or legs, you may not know it is happening until it is too late.  Make sure your health care provider performs a complete foot exam at least annually or more often if you have foot problems. Report any cuts, sores, or bruises to your health care provider immediately. SEEK MEDICAL CARE IF:   You have an injury that is not healing.  You have cuts or breaks in the skin.  You have an ingrown nail.  You notice redness on your legs or feet.  You feel burning or tingling in your legs or feet.  You have pain or cramps in your legs and feet.  Your legs or feet are numb.  Your feet always feel cold. SEEK IMMEDIATE MEDICAL CARE IF:   There is increasing redness,   swelling, or pain in or around a wound.  There is a red line that goes up your leg.  Pus is coming from a wound.  You develop a fever or as directed by your health care provider.  You notice a bad smell coming from an ulcer or wound. Document Released: 12/26/1999 Document Revised: 08/30/2012 Document Reviewed: 06/06/2012 [employer] Patient Information 2014 [employer].  

## 2013-04-23 NOTE — Progress Notes (Signed)
Subjective:    Patient ID: Cameron Noble, male    DOB: [birth year], 53 y.o.   MRN: [id number]  Patient here today for follow up of chronic medical problems.  Diabetes He has type 2 diabetes mellitus. His disease course has been stable. There are no hypoglycemic associated symptoms. Pertinent negatives for hypoglycemia include no headaches. Pertinent negatives for diabetes include no chest pain, no foot paresthesias and no visual change. There are no hypoglycemic complications. Symptoms are stable. There are no diabetic complications. Risk factors for coronary artery disease include dyslipidemia, male sex and hypertension. Current diabetic treatment includes oral agent (monotherapy). He is compliant with treatment all of the time. His weight is stable. He is following a generally healthy diet. When asked about meal planning, he reported none. He participates in exercise three times a week. (Patient doesn't check blood sugars at home) An ACE inhibitor/angiotensin II receptor blocker is being taken. Eye exam is current.  Hypertension This is a chronic problem. The current episode started more than 1 year ago. The problem has been gradually improving since onset. The problem is controlled. Pertinent negatives include no chest pain, headaches, palpitations, peripheral edema or shortness of breath. Risk factors for coronary artery disease include dyslipidemia, diabetes mellitus and male gender. Past treatments include ACE inhibitors. The current treatment provides significant improvement. There are no compliance problems.   Hyperlipidemia This is a chronic problem. The current episode started more than 1 year ago. The problem is controlled. Recent lipid tests were reviewed and are normal. Exacerbating diseases include diabetes. Pertinent negatives include no chest pain, leg pain, myalgias or shortness of breath. Current antihyperlipidemic treatment includes statins. The current treatment provides significant  improvement of lipids. There are no compliance problems.  Risk factors for coronary artery disease include diabetes mellitus, hypertension and male sex.      Review of Systems  Respiratory: Negative for shortness of breath.   Cardiovascular: Negative for chest pain and palpitations.  Musculoskeletal: Negative for myalgias.  Neurological: Negative for headaches.  All other systems reviewed and are negative.      Objective:   Physical Exam  Constitutional: He is oriented to person, place, and time. He appears well-developed and well-nourished.  HENT:  Head: Normocephalic.  Right Ear: External ear normal.  Left Ear: External ear normal.  Eyes: Conjunctivae are normal. Pupils are equal, round, and reactive to light.  Neck: Normal range of motion. Neck supple. No JVD present.  Cardiovascular: Normal rate, regular rhythm and normal heart sounds.   No murmur heard. Pulmonary/Chest: Effort normal and breath sounds normal.  Abdominal: Soft. Bowel sounds are normal.  Musculoskeletal: Normal range of motion.  Neurological: He is alert and oriented to person, place, and time.  Skin: Skin is warm and dry.  +4/4 monofilament bil No edema    Psychiatric: He has a normal mood and affect. His behavior is normal. Judgment and thought content normal.   BP 104/71  Pulse 71  Temp(Src) 97.7 F (36.5 C) (Oral)  Ht 5' 8"  (1.727 m)  Wt 195 lb 3.2 oz (88.542 kg)  BMI 29.69 kg/m2   Results for orders placed in visit on 04/23/13  POCT GLYCOSYLATED HEMOGLOBIN (HGB A1C)      Result Value Ref Range   Hemoglobin A1C 5.6%          Assessment & Plan:   1. Hypertension   2. Hyperlipidemia   3. Type II or unspecified type diabetes mellitus without mention of complication, not stated as uncontrolled  4. Other and unspecified hyperlipidemia   5. Essential hypertension, benign    Orders Placed This Encounter  Procedures  . CMP14+EGFR  . NMR, lipoprofile  . POCT glycosylated hemoglobin (Hb  A1C)   Meds ordered this encounter  Medications  . lisinopril (PRINIVIL,ZESTRIL) 10 MG tablet    Sig: Take 1 tablet (10 mg total) by mouth daily.    Dispense:  90 tablet    Refill:  1    Order Specific Question:  Supervising Provider    Answer:  Chipper Herb [[id number]]  . metFORMIN (GLUCOPHAGE) 1000 MG tablet    Sig: Take 1 tablet (1,000 mg total) by mouth 2 (two) times daily with a meal.    Dispense:  180 tablet    Refill:  1    Order Specific Question:  Supervising Provider    Answer:  Chipper Herb [[id number]]  . atorvastatin (LIPITOR) 10 MG tablet    Sig: TAKE 1 TABLET DAILY    Dispense:  90 tablet    Refill:  1    Order Specific Question:  Supervising Provider    Answer:  Chipper Herb [[id number]]    Labs pending Health maintenance reviewed Diet and exercise encouraged Continue all meds Follow up  In 3 months   [place], FNP

## 2013-04-24 LAB — CMP14+EGFR
A/G RATIO: 1.7 (ref 1.1–2.5)
ALK PHOS: 83 IU/L (ref 39–117)
ALT: 16 IU/L (ref 0–44)
AST: 21 IU/L (ref 0–40)
Albumin: 4.3 g/dL (ref 3.5–5.5)
BILIRUBIN TOTAL: 0.5 mg/dL (ref 0.0–1.2)
BUN / CREAT RATIO: 14 (ref 9–20)
BUN: 15 mg/dL (ref 6–24)
CO2: 25 mmol/L (ref 18–29)
CREATININE: 1.06 mg/dL (ref 0.76–1.27)
Calcium: 9.6 mg/dL (ref 8.7–10.2)
Chloride: 101 mmol/L (ref 97–108)
GFR, EST AFRICAN AMERICAN: 91 mL/min/{1.73_m2} (ref >59)
GFR, EST NON AFRICAN AMERICAN: 79 mL/min/{1.73_m2} (ref >59)
GLOBULIN, TOTAL: 2.5 g/dL (ref 1.5–4.5)
Glucose: 85 mg/dL (ref 65–99)
Potassium: 4.3 mmol/L (ref 3.5–5.2)
SODIUM: 142 mmol/L (ref 134–144)
Total Protein: 6.8 g/dL (ref 6.0–8.5)

## 2013-04-24 LAB — NMR, LIPOPROFILE
Cholesterol: 90 mg/dL (ref <200)
HDL Cholesterol by NMR: 39 mg/dL — ABNORMAL LOW (ref >=40)
HDL PARTICLE NUMBER: 29.5 umol/L — AB (ref >=30.5)
LDL Particle Number: 482 nmol/L (ref <1000)
LDL SIZE: 19.9 nm (ref >20.5)
LDLC SERPL CALC-MCNC: 43 mg/dL (ref <100)
LP-IR SCORE: 54 — AB (ref <=45)
SMALL LDL PARTICLE NUMBER: 327 nmol/L (ref <=527)
Triglycerides by NMR: 42 mg/dL (ref <150)

## 2013-04-26 ENCOUNTER — Telehealth: Payer: Self-pay | Admitting: *Deleted

## 2013-04-26 NOTE — Telephone Encounter (Signed)
Details left on home vm.  Call us for any questions.

## 2013-04-26 NOTE — Telephone Encounter (Signed)
Message copied by Shelbie Ammons on Thu Apr 26, 2013  9:50 AM ------      Message from: Chevis Pretty      Created: Tue Apr 24, 2013  2:09 PM       Hgba1c discussed at appointment      Kidney and liver function stable      Cholesterol looks great      Continue current meds- low fat diet and exercise and recheck in 3 months       ------

## 2013-07-25 ENCOUNTER — Ambulatory Visit: Payer: BC Managed Care – PPO | Admitting: Nurse Practitioner

## 2013-08-13 ENCOUNTER — Ambulatory Visit (INDEPENDENT_AMBULATORY_CARE_PROVIDER_SITE_OTHER): Payer: BC Managed Care – PPO | Admitting: Nurse Practitioner

## 2013-08-13 ENCOUNTER — Encounter: Payer: Self-pay | Admitting: Nurse Practitioner

## 2013-08-13 ENCOUNTER — Ambulatory Visit (INDEPENDENT_AMBULATORY_CARE_PROVIDER_SITE_OTHER): Payer: BC Managed Care – PPO

## 2013-08-13 VITALS — BP 108/70 | HR 67 | Temp 97.6°F | Ht 68.0 in | Wt 197.0 lb

## 2013-08-13 DIAGNOSIS — Z6829 Body mass index (BMI) 29.0-29.9, adult: Secondary | ICD-10-CM

## 2013-08-13 DIAGNOSIS — I10 Essential (primary) hypertension: Secondary | ICD-10-CM

## 2013-08-13 DIAGNOSIS — Z713 Dietary counseling and surveillance: Secondary | ICD-10-CM

## 2013-08-13 DIAGNOSIS — Z23 Encounter for immunization: Secondary | ICD-10-CM

## 2013-08-13 DIAGNOSIS — Z125 Encounter for screening for malignant neoplasm of prostate: Secondary | ICD-10-CM

## 2013-08-13 DIAGNOSIS — E785 Hyperlipidemia, unspecified: Secondary | ICD-10-CM

## 2013-08-13 DIAGNOSIS — E119 Type 2 diabetes mellitus without complications: Secondary | ICD-10-CM

## 2013-08-13 LAB — POCT GLYCOSYLATED HEMOGLOBIN (HGB A1C): Hemoglobin A1C: 5.5

## 2013-08-13 MED ORDER — LISINOPRIL 10 MG PO TABS: 10.0000 mg | ORAL_TABLET | Freq: Every day | ORAL | Status: DC

## 2013-08-13 NOTE — Progress Notes (Signed)
Subjective:    Patient ID: Cameron Noble, male    DOB: 01-10-1961, 53 y.o.   MRN: [id number]  Patient here today for follow up of chronic medical problems.  Diabetes Cameron Noble has type 2 diabetes mellitus. His disease course has been stable. There are no hypoglycemic associated symptoms. Pertinent negatives for hypoglycemia include no headaches. Pertinent negatives for diabetes include no chest pain, no foot paresthesias and no visual change. There are no hypoglycemic complications. Symptoms are stable. There are no diabetic complications. Risk factors for coronary artery disease include dyslipidemia, male sex and hypertension. Current diabetic treatment includes oral agent (monotherapy). Cameron Noble is compliant with treatment all of the time. His weight is stable. Cameron Noble is following a generally healthy diet. When asked about meal planning, Cameron Noble reported none. Cameron Noble participates in exercise three times a week. (Patient doesn't check blood sugars at home) An ACE inhibitor/angiotensin II receptor blocker is being taken. Eye exam is current.  Hypertension This is a chronic problem. The current episode started more than 1 year ago. The problem has been gradually improving since onset. The problem is controlled. Pertinent negatives include no chest pain, headaches, palpitations, peripheral edema or shortness of breath. Risk factors for coronary artery disease include dyslipidemia, diabetes mellitus and male gender. Past treatments include ACE inhibitors. The current treatment provides significant improvement. There are no compliance problems.   Hyperlipidemia This is a chronic problem. The current episode started more than 1 year ago. The problem is controlled. Recent lipid tests were reviewed and are normal. Exacerbating diseases include diabetes. Pertinent negatives include no chest pain, leg pain, myalgias or shortness of breath. Current antihyperlipidemic treatment includes statins. The current treatment provides significant  improvement of lipids. There are no compliance problems.  Risk factors for coronary artery disease include diabetes mellitus, hypertension and male sex.      Review of Systems  Respiratory: Negative for shortness of breath.   Cardiovascular: Negative for chest pain and palpitations.  Musculoskeletal: Negative for myalgias.  Neurological: Negative for headaches.  All other systems reviewed and are negative.      Objective:   Physical Exam  Constitutional: Cameron Noble is oriented to person, place, and time. Cameron Noble appears well-developed and well-nourished.  HENT:  Head: Normocephalic.  Right Ear: External ear normal.  Left Ear: External ear normal.  Nose: Nose normal.  Mouth/Throat: Oropharynx is clear and moist.  Eyes: Conjunctivae and EOM are normal. Pupils are equal, round, and reactive to light.  Neck: Normal range of motion. Neck supple. No JVD present. No thyromegaly present.  Cardiovascular: Normal rate, regular rhythm, normal heart sounds and intact distal pulses.  Exam reveals no gallop and no friction rub.   No murmur heard. Pulmonary/Chest: Effort normal and breath sounds normal. No respiratory distress. Cameron Noble has no wheezes. Cameron Noble has no rales. Cameron Noble exhibits no tenderness.  Abdominal: Soft. Bowel sounds are normal. Cameron Noble exhibits no mass. There is no tenderness.  Genitourinary: Penis normal.  Refuses prostate check   Musculoskeletal: Normal range of motion. Cameron Noble exhibits no edema.  Lymphadenopathy:    Cameron Noble has no cervical adenopathy.  Neurological: Cameron Noble is alert and oriented to person, place, and time. No cranial nerve deficit.  Skin: Skin is warm and dry.  +4/4 monofilament bil No edema    Psychiatric: Cameron Noble has a normal mood and affect. His behavior is normal. Judgment and thought content normal.   BP 108/70  Pulse 67  Temp(Src) 97.6 F (36.4 C) (Oral)  Ht 5' 8"  (1.727 m)  Wt 197  lb (89.359 kg)  BMI 29.96 kg/m2   Results for orders placed in visit on 08/13/13  POCT GLYCOSYLATED  HEMOGLOBIN (HGB A1C)      Result Value Ref Range   Hemoglobin A1C 5.5%      EKG- NSR-[name], FNP   chest x ray- no acute findings-Preliminary reading by Ronnald Collum, FNP  [facility]     Assessment & Plan:   1. Other and unspecified hyperlipidemia   2. Type II or unspecified type diabetes mellitus without mention of complication, not stated as uncontrolled   3. BMI 29.0-29.9,adult   4. Weight loss counseling, encounter for   5. Essential hypertension   6. Prostate cancer screening    Orders Placed This Encounter  Procedures  . DG Chest 2 View    Standing Status: Future     Number of Occurrences: 1     Standing Expiration Date: 10/13/2014    Order Specific Question:  Reason for Exam (SYMPTOM  OR DIAGNOSIS REQUIRED)    Answer:  screenig    Order Specific Question:  Preferred imaging location?    Answer:  Internal  . Tdap vaccine greater than or equal to 7yo IM  . CMP14+EGFR  . NMR, lipoprofile  . PSA, total and free  . POCT glycosylated hemoglobin (Hb A1C)  . EKG 12-Lead   Meds ordered this encounter  Medications  . lisinopril (PRINIVIL,ZESTRIL) 10 MG tablet    Sig: Take 1 tablet (10 mg total) by mouth daily.    Dispense:  90 tablet    Refill:  1    Order Specific Question:  Supervising Provider    Answer:  Chipper Herb [[id number]]    Labs pending Health maintenance reviewed Diet and exercise encouraged Continue all meds Follow up  In 3 months   [place], FNP

## 2013-08-13 NOTE — Patient Instructions (Signed)

## 2013-08-15 LAB — CMP14+EGFR
ALK PHOS: 87 IU/L (ref 39–117)
ALT: 15 IU/L (ref 0–44)
AST: 16 IU/L (ref 0–40)
Albumin/Globulin Ratio: 1.7 (ref 1.1–2.5)
Albumin: 4.2 g/dL (ref 3.5–5.5)
BUN / CREAT RATIO: 15 (ref 9–20)
BUN: 16 mg/dL (ref 6–24)
CHLORIDE: 101 mmol/L (ref 97–108)
CO2: 23 mmol/L (ref 18–29)
Calcium: 9.3 mg/dL (ref 8.7–10.2)
Creatinine, Ser: 1.04 mg/dL (ref 0.76–1.27)
GFR calc Af Amer: 93 mL/min/{1.73_m2} (ref >59)
GFR calc non Af Amer: 80 mL/min/{1.73_m2} (ref >59)
GLOBULIN, TOTAL: 2.5 g/dL (ref 1.5–4.5)
Glucose: 94 mg/dL (ref 65–99)
POTASSIUM: 4 mmol/L (ref 3.5–5.2)
Sodium: 139 mmol/L (ref 134–144)
Total Bilirubin: 0.7 mg/dL (ref 0.0–1.2)
Total Protein: 6.7 g/dL (ref 6.0–8.5)

## 2013-08-15 LAB — NMR, LIPOPROFILE
Cholesterol: 88 mg/dL — ABNORMAL LOW (ref 100–199)
HDL Cholesterol by NMR: 40 mg/dL (ref >39)
HDL PARTICLE NUMBER: 30.2 umol/L — AB (ref >=30.5)
LDL PARTICLE NUMBER: 415 nmol/L (ref <1000)
LDL Size: 20.1 nm (ref >20.5)
LDLC SERPL CALC-MCNC: 38 mg/dL (ref 0–99)
LP-IR Score: 53 — ABNORMAL HIGH (ref <=45)
Small LDL Particle Number: 204 nmol/L (ref <=527)
Triglycerides by NMR: 48 mg/dL (ref 0–149)

## 2013-08-15 LAB — PSA, TOTAL AND FREE
PSA FREE PCT: 27.1 %
PSA, Free: 0.65 ng/mL
PSA: 2.4 ng/mL (ref 0.0–4.0)

## 2013-08-17 ENCOUNTER — Telehealth: Payer: Self-pay | Admitting: Nurse Practitioner

## 2013-08-17 NOTE — Telephone Encounter (Signed)
Message copied by Cline Crock on Fri Aug 17, 2013 10:54 AM ------      Message from: Chevis Pretty      Created: Thu Aug 16, 2013  9:47 AM       Hgba1c discussed at appointment      Kidney and liver function stable      Cholesterol looks good      PSA normal      Continue current meds- low fat diet and exercise and recheck in 3 months             ------

## 2013-08-21 ENCOUNTER — Encounter: Payer: Self-pay | Admitting: Family Medicine

## 2013-09-18 NOTE — Telephone Encounter (Signed)
Patient aware.

## 2013-09-27 ENCOUNTER — Other Ambulatory Visit: Payer: Self-pay | Admitting: Nurse Practitioner

## 2013-11-15 ENCOUNTER — Ambulatory Visit: Payer: BC Managed Care – PPO | Admitting: Nurse Practitioner

## 2013-11-22 ENCOUNTER — Other Ambulatory Visit: Payer: Self-pay | Admitting: Nurse Practitioner

## 2014-01-17 ENCOUNTER — Ambulatory Visit: Payer: BC Managed Care – PPO | Admitting: Nurse Practitioner

## 2014-01-24 ENCOUNTER — Ambulatory Visit (INDEPENDENT_AMBULATORY_CARE_PROVIDER_SITE_OTHER): Payer: BLUE CROSS/BLUE SHIELD

## 2014-01-24 ENCOUNTER — Other Ambulatory Visit: Payer: Self-pay

## 2014-01-24 ENCOUNTER — Encounter: Payer: Self-pay | Admitting: Nurse Practitioner

## 2014-01-24 ENCOUNTER — Ambulatory Visit (INDEPENDENT_AMBULATORY_CARE_PROVIDER_SITE_OTHER): Payer: BLUE CROSS/BLUE SHIELD | Admitting: Nurse Practitioner

## 2014-01-24 VITALS — BP 129/84 | HR 90 | Temp 98.9°F | Ht 68.0 in | Wt 197.0 lb

## 2014-01-24 DIAGNOSIS — R222 Localized swelling, mass and lump, trunk: Secondary | ICD-10-CM

## 2014-01-24 DIAGNOSIS — E119 Type 2 diabetes mellitus without complications: Secondary | ICD-10-CM

## 2014-01-24 DIAGNOSIS — I10 Essential (primary) hypertension: Secondary | ICD-10-CM

## 2014-01-24 DIAGNOSIS — E785 Hyperlipidemia, unspecified: Secondary | ICD-10-CM

## 2014-01-24 LAB — POCT GLYCOSYLATED HEMOGLOBIN (HGB A1C): HEMOGLOBIN A1C: 5.8

## 2014-01-24 LAB — POCT UA - MICROALBUMIN: MICROALBUMIN (UR) POC: 50 mg/L

## 2014-01-24 NOTE — Patient Instructions (Signed)
Diabetes and Foot Care Diabetes may cause you to have problems because of poor blood supply (circulation) to your feet and legs. This may cause the skin on your feet to become thinner, break easier, and heal more slowly. Your skin may become dry, and the skin may peel and crack. You may also have nerve damage in your legs and feet causing decreased feeling in them. You may not notice minor injuries to your feet that could lead to infections or more serious problems. Taking care of your feet is one of the most important things you can do for yourself.  HOME CARE INSTRUCTIONS  Wear shoes at all times, even in the house. Do not go barefoot. Bare feet are easily injured.  Check your feet daily for blisters, cuts, and redness. If you cannot see the bottom of your feet, use a mirror or ask someone for help.  Wash your feet with warm water (do not use hot water) and mild soap. Then pat your feet and the areas between your toes until they are completely dry. Do not soak your feet as this can dry your skin.  Apply a moisturizing lotion or petroleum jelly (that does not contain alcohol and is unscented) to the skin on your feet and to dry, brittle toenails. Do not apply lotion between your toes.  Trim your toenails straight across. Do not dig under them or around the cuticle. File the edges of your nails with an emery board or nail file.  Do not cut corns or calluses or try to remove them with medicine.  Wear clean socks or stockings every day. Make sure they are not too tight. Do not wear knee-high stockings since they may decrease blood flow to your legs.  Wear shoes that fit properly and have enough cushioning. To break in new shoes, wear them for just a few hours a day. This prevents you from injuring your feet. Always look in your shoes before you put them on to be sure there are no objects inside.  Do not cross your legs. This may decrease the blood flow to your feet.  If you find a minor scrape,  cut, or break in the skin on your feet, keep it and the skin around it clean and dry. These areas may be cleansed with mild soap and water. Do not cleanse the area with peroxide, alcohol, or iodine.  When you remove an adhesive bandage, be sure not to damage the skin around it.  If you have a wound, look at it several times a day to make sure it is healing.  Do not use heating pads or hot water bottles. They may burn your skin. If you have lost feeling in your feet or legs, you may not know it is happening until it is too late.  Make sure your health care provider performs a complete foot exam at least annually or more often if you have foot problems. Report any cuts, sores, or bruises to your health care provider immediately. SEEK MEDICAL CARE IF:   You have an injury that is not healing.  You have cuts or breaks in the skin.  You have an ingrown nail.  You notice redness on your legs or feet.  You feel burning or tingling in your legs or feet.  You have pain or cramps in your legs and feet.  Your legs or feet are numb.  Your feet always feel cold. SEEK IMMEDIATE MEDICAL CARE IF:   There is increasing redness,   swelling, or pain in or around a wound.  There is a red line that goes up your leg.  Pus is coming from a wound.  You develop a fever or as directed by your health care provider.  You notice a bad smell coming from an ulcer or wound. Document Released: 12/26/1999 Document Revised: 08/30/2012 Document Reviewed: 06/06/2012 Exit[employer] Patient Information 2015 [employer]. This information is not intended to replace advice given to you by your health care provider. Make sure you discuss any questions you have with your health care provider.  

## 2014-01-24 NOTE — Progress Notes (Signed)
Subjective:    Patient ID: Cameron Noble, male    DOB: 11/09/1960, 53 y.o.   MRN: [id number]  Patient here today for follow up of chronic medical problems.has 2 complaints today: * congestion with slight cough that started 4 days ago- denies fever * knot on left colllar bone- does not hurt and denies injury.  Diabetes He presents for his follow-up diabetic visit. He has type 2 diabetes mellitus. No MedicAlert identification noted. His disease course has been stable. Pertinent negatives for hypoglycemia include no headaches. Pertinent negatives for diabetes include no chest pain and no visual change. There are no hypoglycemic complications. Symptoms are stable. There are no diabetic complications. Risk factors for coronary artery disease include diabetes mellitus, dyslipidemia, hypertension and male sex. Current diabetic treatment includes oral agent (monotherapy). He is compliant with treatment most of the time. His weight is stable. When asked about meal planning, he reported none. He has not had a previous visit with a dietitian. He participates in exercise weekly. His breakfast blood glucose is taken between 8-9 am. His breakfast blood glucose range is generally 110-130 mg/dl. His highest blood glucose is 180-200 mg/dl. His overall blood glucose range is 110-130 mg/dl. An ACE inhibitor/angiotensin II receptor blocker is being taken. He does not see a podiatrist.Eye exam is not current.  Hypertension This is a chronic problem. The current episode started more than 1 year ago. The problem is controlled. Pertinent negatives include no chest pain, headaches, palpitations or shortness of breath. Risk factors for coronary artery disease include diabetes mellitus, dyslipidemia and male gender. Past treatments include ACE inhibitors.  Hyperlipidemia This is a chronic problem. The current episode started more than 1 year ago. The problem is controlled. Recent lipid tests were reviewed and are normal.  Exacerbating diseases include diabetes. He has no history of hypothyroidism or obesity. Pertinent negatives include no chest pain, myalgias or shortness of breath. Current antihyperlipidemic treatment includes statins. The current treatment provides moderate improvement of lipids. Compliance problems include adherence to diet.  Risk factors for coronary artery disease include dyslipidemia, hypertension and male sex.      Review of Systems  Respiratory: Negative for shortness of breath.   Cardiovascular: Negative for chest pain and palpitations.  Musculoskeletal: Negative for myalgias.  Neurological: Negative for headaches.  All other systems reviewed and are negative.      Objective:   Physical Exam  Constitutional: He is oriented to person, place, and time. He appears well-developed and well-nourished.  HENT:  Head: Normocephalic.  Right Ear: External ear normal.  Left Ear: External ear normal.  Eyes: Conjunctivae are normal. Pupils are equal, round, and reactive to light.  Neck: Normal range of motion. Neck supple. No JVD present.  Cardiovascular: Normal rate, regular rhythm and normal heart sounds.   No murmur heard. Pulmonary/Chest: Effort normal and breath sounds normal.  Abdominal: Soft. Bowel sounds are normal.  Musculoskeletal: Normal range of motion.  Neurological: He is alert and oriented to person, place, and time.  Skin: Skin is warm and dry.  +4/4 monofilament bil No edema    Psychiatric: He has a normal mood and affect. His behavior is normal. Judgment and thought content normal.   BP 129/84 mmHg  Pulse 90  Temp(Src) 98.9 F (37.2 C) (Oral)  Ht 5' 8" (1.727 m)  Wt 197 lb (89.359 kg)  BMI 29.96 kg/m2  Results for orders placed or performed in visit on 01/24/14  POCT glycosylated hemoglobin (Hb A1C)  Result Value Ref Range     Hemoglobin A1C 5.8   POCT UA - Microalbumin  Result Value Ref Range   Microalbumin Ur, POC 50 mg/L    Chest x ray- no masses seen  along left medical clavicle area-Preliminary reading by Ronnald Collum, FNP  [facility]        Assessment & Plan:   1. Essential hypertension Do not add salt to diet - CMP14+EGFR  2. Type 2 diabetes mellitus without complication Continue carb counting - POCT glycosylated hemoglobin (Hb A1C) - POCT UA - Microalbumin - Microalbumin, urine  3. Hyperlipidemia Low fat diet - NMR, lipoprofile  4. Chest wall mass Will further evaluate - DG Chest 2 View; Future - US Soft Tissue Head/Neck; Future    Labs pending Health maintenance reviewed Diet and exercise encouraged Continue all meds Follow up  In 3 months   [place], FNP

## 2014-01-25 LAB — CMP14+EGFR
A/G RATIO: 1.7 (ref 1.1–2.5)
ALT: 19 IU/L (ref 0–44)
AST: 23 IU/L (ref 0–40)
Albumin: 4.3 g/dL (ref 3.5–5.5)
Alkaline Phosphatase: 92 IU/L (ref 39–117)
BILIRUBIN TOTAL: 0.5 mg/dL (ref 0.0–1.2)
BUN/Creatinine Ratio: 11 (ref 9–20)
BUN: 13 mg/dL (ref 6–24)
CALCIUM: 9.6 mg/dL (ref 8.7–10.2)
CO2: 26 mmol/L (ref 18–29)
Chloride: 101 mmol/L (ref 97–108)
Creatinine, Ser: 1.17 mg/dL (ref 0.76–1.27)
GFR calc Af Amer: 81 mL/min/{1.73_m2} (ref >59)
GFR calc non Af Amer: 70 mL/min/{1.73_m2} (ref >59)
Globulin, Total: 2.6 g/dL (ref 1.5–4.5)
Glucose: 115 mg/dL — ABNORMAL HIGH (ref 65–99)
Potassium: 4.1 mmol/L (ref 3.5–5.2)
SODIUM: 141 mmol/L (ref 134–144)
Total Protein: 6.9 g/dL (ref 6.0–8.5)

## 2014-01-25 LAB — NMR, LIPOPROFILE
CHOLESTEROL: 96 mg/dL — AB (ref 100–199)
HDL Cholesterol by NMR: 45 mg/dL (ref >39)
HDL Particle Number: 29 umol/L — ABNORMAL LOW (ref >=30.5)
LDL Particle Number: 508 nmol/L (ref <1000)
LDL Size: 20.4 nm (ref >20.5)
LDL-C: 44 mg/dL (ref 0–99)
LP-IR Score: 40 (ref <=45)
SMALL LDL PARTICLE NUMBER: 271 nmol/L (ref <=527)
Triglycerides by NMR: 37 mg/dL (ref 0–149)

## 2014-01-25 LAB — MICROALBUMIN, URINE: Microalbumin, Urine: 13.4 ug/mL (ref 0.0–17.0)

## 2014-01-28 ENCOUNTER — Ambulatory Visit (HOSPITAL_COMMUNITY)
Admission: RE | Admit: 2014-01-28 | Discharge: 2014-01-28 | Disposition: A | Discharge status: Home / Self Care | Payer: BLUE CROSS/BLUE SHIELD | Source: Ambulatory Visit | Attending: Nurse Practitioner | Admitting: Nurse Practitioner

## 2014-01-28 DIAGNOSIS — R937 Abnormal findings on diagnostic imaging of other parts of musculoskeletal system: Secondary | ICD-10-CM | POA: Insufficient documentation

## 2014-01-28 DIAGNOSIS — R222 Localized swelling, mass and lump, trunk: Secondary | ICD-10-CM | POA: Diagnosis present

## 2014-01-30 ENCOUNTER — Other Ambulatory Visit: Payer: Self-pay | Admitting: Nurse Practitioner

## 2014-02-04 ENCOUNTER — Other Ambulatory Visit: Payer: Self-pay | Admitting: Nurse Practitioner

## 2014-03-06 ENCOUNTER — Other Ambulatory Visit: Payer: Self-pay | Admitting: Nurse Practitioner

## 2014-04-26 ENCOUNTER — Encounter: Payer: Self-pay | Admitting: Nurse Practitioner

## 2014-04-26 ENCOUNTER — Ambulatory Visit (INDEPENDENT_AMBULATORY_CARE_PROVIDER_SITE_OTHER): Payer: BLUE CROSS/BLUE SHIELD | Admitting: Nurse Practitioner

## 2014-04-26 VITALS — BP 119/78 | HR 67 | Temp 97.8°F | Ht 68.0 in | Wt 194.0 lb

## 2014-04-26 DIAGNOSIS — E785 Hyperlipidemia, unspecified: Secondary | ICD-10-CM | POA: Diagnosis not present

## 2014-04-26 DIAGNOSIS — E119 Type 2 diabetes mellitus without complications: Secondary | ICD-10-CM | POA: Diagnosis not present

## 2014-04-26 DIAGNOSIS — I10 Essential (primary) hypertension: Secondary | ICD-10-CM | POA: Diagnosis not present

## 2014-04-26 LAB — POCT GLYCOSYLATED HEMOGLOBIN (HGB A1C): Hemoglobin A1C: 5.9

## 2014-04-26 NOTE — Progress Notes (Signed)
Subjective:    Patient ID: Cameron Noble, male    DOB: Feb 15, 1960, 54 y.o.   MRN: [id number]  Patient here today for follow up of chronic medical problems.  Diabetes He presents for his follow-up diabetic visit. He has type 2 diabetes mellitus. No MedicAlert identification noted. His disease course has been stable. Pertinent negatives for hypoglycemia include no headaches. Pertinent negatives for diabetes include no chest pain and no visual change. There are no hypoglycemic complications. Symptoms are stable. There are no diabetic complications. Risk factors for coronary artery disease include diabetes mellitus, dyslipidemia, hypertension and male sex. Current diabetic treatment includes oral agent (monotherapy). He is compliant with treatment most of the time. His weight is stable. When asked about meal planning, he reported none. He has not had a previous visit with a dietitian. He participates in exercise weekly. His breakfast blood glucose is taken between 8-9 am. His breakfast blood glucose range is generally 110-130 mg/dl. His highest blood glucose is 180-200 mg/dl. His overall blood glucose range is 110-130 mg/dl. An ACE inhibitor/angiotensin II receptor blocker is being taken. He does not see a podiatrist.Eye exam is not current.  Hypertension This is a chronic problem. The current episode started more than 1 year ago. The problem is controlled. Pertinent negatives include no chest pain, headaches, palpitations or shortness of breath. Risk factors for coronary artery disease include diabetes mellitus, dyslipidemia and male gender. Past treatments include ACE inhibitors.  Hyperlipidemia This is a chronic problem. The current episode started more than 1 year ago. The problem is controlled. Recent lipid tests were reviewed and are normal. Exacerbating diseases include diabetes. He has no history of hypothyroidism or obesity. Pertinent negatives include no chest pain, myalgias or shortness of breath.  Current antihyperlipidemic treatment includes statins. The current treatment provides moderate improvement of lipids. Compliance problems include adherence to diet.  Risk factors for coronary artery disease include dyslipidemia, hypertension and male sex.      Review of Systems  Constitutional: Negative.   HENT: Negative.   Respiratory: Negative for shortness of breath.   Cardiovascular: Negative for chest pain and palpitations.  Genitourinary: Negative.   Musculoskeletal: Negative for myalgias.  Neurological: Negative for headaches.  Psychiatric/Behavioral: Negative.   All other systems reviewed and are negative.      Objective:   Physical Exam  Constitutional: He is oriented to person, place, and time. He appears well-developed and well-nourished.  HENT:  Head: Normocephalic.  Right Ear: External ear normal.  Left Ear: External ear normal.  Eyes: Conjunctivae are normal. Pupils are equal, round, and reactive to light.  Neck: Normal range of motion. Neck supple. No JVD present.  Cardiovascular: Normal rate, regular rhythm and normal heart sounds.   No murmur heard. Pulmonary/Chest: Effort normal and breath sounds normal.  Abdominal: Soft. Bowel sounds are normal.  Musculoskeletal: Normal range of motion.  Neurological: He is alert and oriented to person, place, and time.  Skin: Skin is warm and dry.  +4/4 monofilament bil No edema    Psychiatric: He has a normal mood and affect. His behavior is normal. Judgment and thought content normal.   BP 119/78 mmHg  Pulse 67  Temp(Src) 97.8 F (36.6 C) (Oral)  Ht 5' 8"  (1.727 m)  Wt 194 lb (87.998 kg)  BMI 29.50 kg/m2  Results for orders placed or performed in visit on 04/26/14  POCT glycosylated hemoglobin (Hb A1C)  Result Value Ref Range   Hemoglobin A1C 5.9  Assessment & Plan:   1. Essential hypertension Do not add salt to diet - CMP14+EGFR  2. Type 2 diabetes mellitus without  complication Continue to watch carbs - POCT glycosylated hemoglobin (Hb A1C)  3. Hyperlipidemia Low fat diet - NMR, lipoprofile     Labs pending Health maintenance reviewed Diet and exercise encouraged Continue all meds Follow up  In 3 month   [place], FNP

## 2014-04-26 NOTE — Patient Instructions (Signed)
Fat and Cholesterol Control Diet Fat and cholesterol levels in your blood and organs are influenced by your diet. High levels of fat and cholesterol may lead to diseases of the heart, small and large blood vessels, gallbladder, liver, and pancreas. CONTROLLING FAT AND CHOLESTEROL WITH DIET Although exercise and lifestyle factors are important, your diet is key. That is because certain foods are known to raise cholesterol and others to lower it. The goal is to balance foods for their effect on cholesterol and more importantly, to replace saturated and trans fat with other types of fat, such as monounsaturated fat, polyunsaturated fat, and omega-3 fatty acids. On average, a person should consume no more than 15 to 17 g of saturated fat daily. Saturated and trans fats are considered "bad" fats, and they will raise LDL cholesterol. Saturated fats are primarily found in animal products such as meats, butter, and cream. However, that does not mean you need to give up all your favorite foods. Today, there are good tasting, low-fat, low-cholesterol substitutes for most of the things you like to eat. Choose low-fat or nonfat alternatives. Choose round or loin cuts of red meat. These types of cuts are lowest in fat and cholesterol. Chicken (without the skin), fish, veal, and ground turkey breast are great choices. Eliminate fatty meats, such as hot dogs and salami. Even shellfish have little or no saturated fat. Have a 3 oz (85 g) portion when you eat lean meat, poultry, or fish. Trans fats are also called "partially hydrogenated oils." They are oils that have been scientifically manipulated so that they are solid at room temperature resulting in a longer shelf life and improved taste and texture of foods in which they are added. Trans fats are found in stick margarine, some tub margarines, cookies, crackers, and baked goods.  When baking and cooking, oils are a great substitute for butter. The monounsaturated oils are  especially beneficial since it is believed they lower LDL and raise HDL. The oils you should avoid entirely are saturated tropical oils, such as coconut and palm.  Remember to eat a lot from food groups that are naturally free of saturated and trans fat, including fish, fruit, vegetables, beans, grains (barley, rice, couscous, bulgur wheat), and pasta (without cream sauces).  IDENTIFYING FOODS THAT LOWER FAT AND CHOLESTEROL  Soluble fiber may lower your cholesterol. This type of fiber is found in fruits such as apples, vegetables such as broccoli, potatoes, and carrots, legumes such as beans, peas, and lentils, and grains such as barley. Foods fortified with plant sterols (phytosterol) may also lower cholesterol. You should eat at least 2 g per day of these foods for a cholesterol lowering effect.  Read package labels to identify low-saturated fats, trans fat free, and low-fat foods at the supermarket. Select cheeses that have only 2 to 3 g saturated fat per ounce. Use a heart-healthy tub margarine that is free of trans fats or partially hydrogenated oil. When buying baked goods (cookies, crackers), avoid partially hydrogenated oils. Breads and muffins should be made from whole grains (whole-wheat or whole oat flour, instead of "flour" or "enriched flour"). Buy non-creamy canned soups with reduced salt and no added fats.  FOOD PREPARATION TECHNIQUES  Never deep-fry. If you must fry, either stir-fry, which uses very little fat, or use non-stick cooking sprays. When possible, broil, bake, or roast meats, and steam vegetables. Instead of putting butter or margarine on vegetables, use lemon and herbs, applesauce, and cinnamon (for squash and sweet potatoes). Use nonfat   yogurt, salsa, and low-fat dressings for salads.  LOW-SATURATED FAT / LOW-FAT FOOD SUBSTITUTES Meats / Saturated Fat (g)  Avoid: Steak, marbled (3 oz/85 g) / 11 g  Choose: Steak, lean (3 oz/85 g) / 4 g  Avoid: Hamburger (3 oz/85 g) / 7  g  Choose: Hamburger, lean (3 oz/85 g) / 5 g  Avoid: Ham (3 oz/85 g) / 6 g  Choose: Ham, lean cut (3 oz/85 g) / 2.4 g  Avoid: Chicken, with skin, dark meat (3 oz/85 g) / 4 g  Choose: Chicken, skin removed, dark meat (3 oz/85 g) / 2 g  Avoid: Chicken, with skin, light meat (3 oz/85 g) / 2.5 g  Choose: Chicken, skin removed, light meat (3 oz/85 g) / 1 g Dairy / Saturated Fat (g)  Avoid: Whole milk (1 cup) / 5 g  Choose: Low-fat milk, 2% (1 cup) / 3 g  Choose: Low-fat milk, 1% (1 cup) / 1.5 g  Choose: Skim milk (1 cup) / 0.3 g  Avoid: Hard cheese (1 oz/28 g) / 6 g  Choose: Skim milk cheese (1 oz/28 g) / 2 to 3 g  Avoid: Cottage cheese, 4% fat (1 cup) / 6.5 g  Choose: Low-fat cottage cheese, 1% fat (1 cup) / 1.5 g  Avoid: Ice cream (1 cup) / 9 g  Choose: Sherbet (1 cup) / 2.5 g  Choose: Nonfat frozen yogurt (1 cup) / 0.3 g  Choose: Frozen fruit bar / trace  Avoid: Whipped cream (1 tbs) / 3.5 g  Choose: Nondairy whipped topping (1 tbs) / 1 g Condiments / Saturated Fat (g)  Avoid: Mayonnaise (1 tbs) / 2 g  Choose: Low-fat mayonnaise (1 tbs) / 1 g  Avoid: Butter (1 tbs) / 7 g  Choose: Extra light margarine (1 tbs) / 1 g  Avoid: Coconut oil (1 tbs) / 11.8 g  Choose: Olive oil (1 tbs) / 1.8 g  Choose: Corn oil (1 tbs) / 1.7 g  Choose: Safflower oil (1 tbs) / 1.2 g  Choose: Sunflower oil (1 tbs) / 1.4 g  Choose: Soybean oil (1 tbs) / 2.4 g  Choose: Canola oil (1 tbs) / 1 g Document Released: 12/28/2004 Document Revised: 04/24/2012 Document Reviewed: 03/28/2013 [employer] Patient Information 2015 [employer]. This information is not intended to replace advice given to you by your health care provider. Make sure you discuss any questions you have with your health care provider.  

## 2014-04-27 LAB — CMP14+EGFR
ALT: 18 IU/L (ref 0–44)
AST: 17 IU/L (ref 0–40)
Albumin/Globulin Ratio: 1.5 (ref 1.1–2.5)
Albumin: 4.3 g/dL (ref 3.5–5.5)
Alkaline Phosphatase: 88 IU/L (ref 39–117)
BUN/Creatinine Ratio: 17 (ref 9–20)
BUN: 17 mg/dL (ref 6–24)
Bilirubin Total: 0.6 mg/dL (ref 0.0–1.2)
CO2: 25 mmol/L (ref 18–29)
Calcium: 9.7 mg/dL (ref 8.7–10.2)
Chloride: 100 mmol/L (ref 97–108)
Creatinine, Ser: 1.01 mg/dL (ref 0.76–1.27)
GFR calc Af Amer: 96 mL/min/1.73
GFR calc non Af Amer: 83 mL/min/1.73
Globulin, Total: 2.9 g/dL (ref 1.5–4.5)
Glucose: 107 mg/dL — ABNORMAL HIGH (ref 65–99)
Potassium: 4.1 mmol/L (ref 3.5–5.2)
Sodium: 140 mmol/L (ref 134–144)
Total Protein: 7.2 g/dL (ref 6.0–8.5)

## 2014-04-27 LAB — NMR, LIPOPROFILE
CHOLESTEROL: 99 mg/dL — AB (ref 100–199)
HDL CHOLESTEROL BY NMR: 40 mg/dL (ref >39)
HDL Particle Number: 30.6 umol/L (ref >=30.5)
LDL Particle Number: 699 nmol/L (ref <1000)
LDL Size: 20 nm (ref >20.5)
LDL-C: 51 mg/dL (ref 0–99)
LP-IR SCORE: 42 (ref <=45)
Small LDL Particle Number: 472 nmol/L (ref <=527)
Triglycerides by NMR: 38 mg/dL (ref 0–149)

## 2014-07-15 ENCOUNTER — Other Ambulatory Visit: Payer: Self-pay | Admitting: Nurse Practitioner

## 2014-07-24 ENCOUNTER — Encounter: Payer: Self-pay | Admitting: Gastroenterology

## 2014-07-27 ENCOUNTER — Other Ambulatory Visit: Payer: Self-pay | Admitting: Family Medicine

## 2014-08-12 ENCOUNTER — Encounter: Payer: Self-pay | Admitting: Nurse Practitioner

## 2014-08-12 ENCOUNTER — Ambulatory Visit (INDEPENDENT_AMBULATORY_CARE_PROVIDER_SITE_OTHER): Payer: BLUE CROSS/BLUE SHIELD | Admitting: Nurse Practitioner

## 2014-08-12 VITALS — BP 123/82 | HR 76 | Temp 98.5°F | Ht 68.0 in | Wt 196.0 lb

## 2014-08-12 DIAGNOSIS — M25562 Pain in left knee: Secondary | ICD-10-CM | POA: Diagnosis not present

## 2014-08-12 DIAGNOSIS — E785 Hyperlipidemia, unspecified: Secondary | ICD-10-CM

## 2014-08-12 DIAGNOSIS — I10 Essential (primary) hypertension: Secondary | ICD-10-CM

## 2014-08-12 DIAGNOSIS — E119 Type 2 diabetes mellitus without complications: Secondary | ICD-10-CM | POA: Diagnosis not present

## 2014-08-12 DIAGNOSIS — Z6829 Body mass index (BMI) 29.0-29.9, adult: Secondary | ICD-10-CM | POA: Diagnosis not present

## 2014-08-12 LAB — POCT GLYCOSYLATED HEMOGLOBIN (HGB A1C): HEMOGLOBIN A1C: 5.8

## 2014-08-12 MED ORDER — LISINOPRIL 10 MG PO TABS: 10.0000 mg | ORAL_TABLET | Freq: Every day | ORAL | Status: DC

## 2014-08-12 MED ORDER — ATORVASTATIN CALCIUM 10 MG PO TABS: 10.0000 mg | ORAL_TABLET | Freq: Every day | ORAL | Status: DC

## 2014-08-12 MED ORDER — METFORMIN HCL 1000 MG PO TABS: 1000.0000 mg | ORAL_TABLET | Freq: Two times a day (BID) | ORAL | Status: DC

## 2014-08-12 NOTE — Progress Notes (Signed)
Subjective:    Patient ID: Cameron Noble, male    DOB: October 02, 1960, 54 y.o.   MRN: [id number]  Patient here today for follow up of chronic medical problems.  Diabetes He presents for his follow-up diabetic visit. He has type 2 diabetes mellitus. No MedicAlert identification noted. His disease course has been stable. Pertinent negatives for hypoglycemia include no headaches. Pertinent negatives for diabetes include no chest pain and no visual change. There are no hypoglycemic complications. Symptoms are stable. There are no diabetic complications. Risk factors for coronary artery disease include diabetes mellitus, dyslipidemia, hypertension and male sex. Current diabetic treatment includes oral agent (monotherapy). He is compliant with treatment most of the time. His weight is stable. When asked about meal planning, he reported none. He has not had a previous visit with a dietitian. He participates in exercise weekly. His breakfast blood glucose is taken between 8-9 am. His breakfast blood glucose range is generally 110-130 mg/dl. His highest blood glucose is 180-200 mg/dl. His overall blood glucose range is 110-130 mg/dl. An ACE inhibitor/angiotensin II receptor blocker is being taken. He does not see a podiatrist.Eye exam is not current.  Hypertension This is a chronic problem. The current episode started more than 1 year ago. The problem is controlled. Pertinent negatives include no chest pain, headaches, palpitations or shortness of breath. Risk factors for coronary artery disease include diabetes mellitus, dyslipidemia and male gender. Past treatments include ACE inhibitors.  Hyperlipidemia This is a chronic problem. The current episode started more than 1 year ago. The problem is controlled. Recent lipid tests were reviewed and are normal. Exacerbating diseases include diabetes. He has no history of hypothyroidism or obesity. Pertinent negatives include no chest pain, myalgias or shortness of breath.  Current antihyperlipidemic treatment includes statins. The current treatment provides moderate improvement of lipids. Compliance problems include adherence to diet.  Risk factors for coronary artery disease include dyslipidemia, hypertension and male sex.      Review of Systems  Constitutional: Negative.   HENT: Negative.   Respiratory: Negative for shortness of breath.   Cardiovascular: Negative for chest pain and palpitations.  Genitourinary: Negative.   Musculoskeletal: Negative for myalgias.  Neurological: Negative for headaches.  Psychiatric/Behavioral: Negative.   All other systems reviewed and are negative.      Objective:   Physical Exam  Constitutional: He is oriented to person, place, and time. He appears well-developed and well-nourished.  HENT:  Head: Normocephalic.  Right Ear: External ear normal.  Left Ear: External ear normal.  Eyes: Conjunctivae are normal. Pupils are equal, round, and reactive to light.  Neck: Normal range of motion. Neck supple. No JVD present.  Cardiovascular: Normal rate, regular rhythm and normal heart sounds.   No murmur heard. Pulmonary/Chest: Effort normal and breath sounds normal.  Abdominal: Soft. Bowel sounds are normal.  Musculoskeletal: Normal range of motion.  Mild tenderness in back of left knee- FROM of knee without pain. No effusion  Neurological: He is alert and oriented to person, place, and time.  Skin: Skin is warm and dry.  +4/4 monofilament bil No edema    Psychiatric: He has a normal mood and affect. His behavior is normal. Judgment and thought content normal.     BP 123/82 mmHg  Pulse 76  Temp(Src) 98.5 F (36.9 C)  Ht 5' 8"  (1.727 m)  Wt 196 lb (88.905 kg)  BMI 29.81 kg/m2  Results for orders placed or performed in visit on 08/12/14  POCT glycosylated hemoglobin (Hb A1C)  Result Value Ref Range   Hemoglobin A1C 5.8        Assessment & Plan:   1. Type 2 diabetes mellitus without complication Continue  carb counting - POCT glycosylated hemoglobin (Hb A1C) - metFORMIN (GLUCOPHAGE) 1000 MG tablet; Take 1 tablet (1,000 mg total) by mouth 2 (two) times daily with a meal.  Dispense: 180 tablet; Refill: 1  2. Essential hypertension, benign Do not add salt to diet - CMP14+EGFR - lisinopril (PRINIVIL,ZESTRIL) 10 MG tablet; Take 1 tablet (10 mg total) by mouth daily.  Dispense: 90 tablet; Refill: 1  3. Hyperlipidemia with target LDL less than 100 Low fat diet - Lipid panel - atorvastatin (LIPITOR) 10 MG tablet; Take 1 tablet (10 mg total) by mouth daily.  Dispense: 90 tablet; Refill: 1  4. BMI 29.0-29.9,adult Discussed diet and exercise for person with BMI >25 Will recheck weight in 3-6 months  5. Left post knee pain -doppler study    Labs pending Health maintenance reviewed Diet and exercise encouraged Continue all meds Follow up  In 3 month   [place], FNP

## 2014-08-12 NOTE — Patient Instructions (Signed)
Exercise to Stay Healthy Exercise helps you become and stay healthy. EXERCISE IDEAS AND TIPS Choose exercises that:  You enjoy.  Fit into your day. You do not need to exercise really hard to be healthy. You can do exercises at a slow or medium level and stay healthy. You can:  Stretch before and after working out.  Try yoga, Pilates, or tai chi.  Lift weights.  Walk fast, swim, jog, run, climb stairs, bicycle, dance, or rollerskate.  Take aerobic classes. Exercises that burn about 150 calories:  Running 1  miles in 15 minutes.  Playing volleyball for 45 to 60 minutes.  Washing and waxing a car for 45 to 60 minutes.  Playing touch football for 45 minutes.  Walking 1  miles in 35 minutes.  Pushing a stroller 1  miles in 30 minutes.  Playing basketball for 30 minutes.  Raking leaves for 30 minutes.  Bicycling 5 miles in 30 minutes.  Walking 2 miles in 30 minutes.  Dancing for 30 minutes.  Shoveling snow for 15 minutes.  Swimming laps for 20 minutes.  Walking up stairs for 15 minutes.  Bicycling 4 miles in 15 minutes.  Gardening for 30 to 45 minutes.  Jumping rope for 15 minutes.  Washing windows or floors for 45 to 60 minutes. Document Released: 01/30/2010 Document Revised: 03/22/2011 Document Reviewed: 01/30/2010 [employer] Patient Information 2015 [employer]. This information is not intended to replace advice given to you by your health care provider. Make sure you discuss any questions you have with your health care provider.  

## 2014-08-13 LAB — CMP14+EGFR
ALT: 17 IU/L (ref 0–44)
AST: 16 IU/L (ref 0–40)
Albumin/Globulin Ratio: 1.5 (ref 1.1–2.5)
Albumin: 4.3 g/dL (ref 3.5–5.5)
Alkaline Phosphatase: 90 IU/L (ref 39–117)
BILIRUBIN TOTAL: 0.5 mg/dL (ref 0.0–1.2)
BUN/Creatinine Ratio: 17 (ref 9–20)
BUN: 18 mg/dL (ref 6–24)
CO2: 25 mmol/L (ref 18–29)
Calcium: 10.1 mg/dL (ref 8.7–10.2)
Chloride: 101 mmol/L (ref 97–108)
Creatinine, Ser: 1.08 mg/dL (ref 0.76–1.27)
GFR calc Af Amer: 88 mL/min/{1.73_m2} (ref >59)
GFR calc non Af Amer: 76 mL/min/{1.73_m2} (ref >59)
GLOBULIN, TOTAL: 2.8 g/dL (ref 1.5–4.5)
Glucose: 90 mg/dL (ref 65–99)
Potassium: 4.5 mmol/L (ref 3.5–5.2)
Sodium: 142 mmol/L (ref 134–144)
Total Protein: 7.1 g/dL (ref 6.0–8.5)

## 2014-08-13 LAB — LIPID PANEL
Chol/HDL Ratio: 2.3 ratio units (ref 0.0–5.0)
Cholesterol, Total: 99 mg/dL — ABNORMAL LOW (ref 100–199)
HDL: 43 mg/dL (ref >39)
LDL CALC: 45 mg/dL (ref 0–99)
TRIGLYCERIDES: 54 mg/dL (ref 0–149)
VLDL Cholesterol Cal: 11 mg/dL (ref 5–40)

## 2014-11-14 ENCOUNTER — Ambulatory Visit: Payer: BLUE CROSS/BLUE SHIELD | Admitting: Nurse Practitioner

## 2014-12-12 ENCOUNTER — Encounter: Payer: Self-pay | Admitting: Nurse Practitioner

## 2014-12-12 ENCOUNTER — Ambulatory Visit (INDEPENDENT_AMBULATORY_CARE_PROVIDER_SITE_OTHER): Payer: BLUE CROSS/BLUE SHIELD | Admitting: Nurse Practitioner

## 2014-12-12 VITALS — BP 116/76 | HR 77 | Temp 99.0°F | Ht 68.0 in | Wt 193.0 lb

## 2014-12-12 DIAGNOSIS — Z6829 Body mass index (BMI) 29.0-29.9, adult: Secondary | ICD-10-CM | POA: Diagnosis not present

## 2014-12-12 DIAGNOSIS — E119 Type 2 diabetes mellitus without complications: Secondary | ICD-10-CM

## 2014-12-12 DIAGNOSIS — E785 Hyperlipidemia, unspecified: Secondary | ICD-10-CM | POA: Diagnosis not present

## 2014-12-12 DIAGNOSIS — I10 Essential (primary) hypertension: Secondary | ICD-10-CM | POA: Diagnosis not present

## 2014-12-12 DIAGNOSIS — Z1159 Encounter for screening for other viral diseases: Secondary | ICD-10-CM

## 2014-12-12 LAB — POCT GLYCOSYLATED HEMOGLOBIN (HGB A1C): Hemoglobin A1C: 5.9

## 2014-12-12 NOTE — Patient Instructions (Signed)
Diabetes and Foot Care Diabetes may cause you to have problems because of poor blood supply (circulation) to your feet and legs. This may cause the skin on your feet to become thinner, break easier, and heal more slowly. Your skin may become dry, and the skin may peel and crack. You may also have nerve damage in your legs and feet causing decreased feeling in them. You may not notice minor injuries to your feet that could lead to infections or more serious problems. Taking care of your feet is one of the most important things you can do for yourself.  HOME CARE INSTRUCTIONS  Wear shoes at all times, even in the house. Do not go barefoot. Bare feet are easily injured.  Check your feet daily for blisters, cuts, and redness. If you cannot see the bottom of your feet, use a mirror or ask someone for help.  Wash your feet with warm water (do not use hot water) and mild soap. Then pat your feet and the areas between your toes until they are completely dry. Do not soak your feet as this can dry your skin.  Apply a moisturizing lotion or petroleum jelly (that does not contain alcohol and is unscented) to the skin on your feet and to dry, brittle toenails. Do not apply lotion between your toes.  Trim your toenails straight across. Do not dig under them or around the cuticle. File the edges of your nails with an emery board or nail file.  Do not cut corns or calluses or try to remove them with medicine.  Wear clean socks or stockings every day. Make sure they are not too tight. Do not wear knee-high stockings since they may decrease blood flow to your legs.  Wear shoes that fit properly and have enough cushioning. To break in new shoes, wear them for just a few hours a day. This prevents you from injuring your feet. Always look in your shoes before you put them on to be sure there are no objects inside.  Do not cross your legs. This may decrease the blood flow to your feet.  If you find a minor scrape,  cut, or break in the skin on your feet, keep it and the skin around it clean and dry. These areas may be cleansed with mild soap and water. Do not cleanse the area with peroxide, alcohol, or iodine.  When you remove an adhesive bandage, be sure not to damage the skin around it.  If you have a wound, look at it several times a day to make sure it is healing.  Do not use heating pads or hot water bottles. They may burn your skin. If you have lost feeling in your feet or legs, you may not know it is happening until it is too late.  Make sure your health care provider performs a complete foot exam at least annually or more often if you have foot problems. Report any cuts, sores, or bruises to your health care provider immediately. SEEK MEDICAL CARE IF:   You have an injury that is not healing.  You have cuts or breaks in the skin.  You have an ingrown nail.  You notice redness on your legs or feet.  You feel burning or tingling in your legs or feet.  You have pain or cramps in your legs and feet.  Your legs or feet are numb.  Your feet always feel cold. SEEK IMMEDIATE MEDICAL CARE IF:   There is increasing redness,   swelling, or pain in or around a wound.  There is a red line that goes up your leg.  Pus is coming from a wound.  You develop a fever or as directed by your health care provider.  You notice a bad smell coming from an ulcer or wound.   This information is not intended to replace advice given to you by your health care provider. Make sure you discuss any questions you have with your health care provider.   Document Released: 12/26/1999 Document Revised: 08/30/2012 Document Reviewed: 06/06/2012 Elsevier Interactive Patient Education 2016 Elsevier Inc.  

## 2014-12-12 NOTE — Progress Notes (Signed)
Subjective:    Patient ID: Cameron Noble, male    DOB: 1960-07-06, 54 y.o.   MRN: [id number]  Patient here today for follow up of chronic medical problems.  Diabetes He presents for his follow-up diabetic visit. He has type 2 diabetes mellitus. No MedicAlert identification noted. His disease course has been stable. Pertinent negatives for hypoglycemia include no headaches. Pertinent negatives for diabetes include no chest pain and no visual change. There are no hypoglycemic complications. Symptoms are stable. There are no diabetic complications. Risk factors for coronary artery disease include diabetes mellitus, dyslipidemia, hypertension and male sex. Current diabetic treatment includes oral agent (monotherapy). He is compliant with treatment most of the time. His weight is stable. When asked about meal planning, he reported none. He has not had a previous visit with a dietitian. He participates in exercise weekly. Home blood sugar record trend: patient has not been checking blood sugars. An ACE inhibitor/angiotensin II receptor blocker is being taken. He does not see a podiatrist.Eye exam is not current.  Hypertension This is a chronic problem. The current episode started more than 1 year ago. The problem is controlled. Pertinent negatives include no chest pain, headaches, palpitations or shortness of breath. Risk factors for coronary artery disease include diabetes mellitus, dyslipidemia and male gender. Past treatments include ACE inhibitors.  Hyperlipidemia This is a chronic problem. The current episode started more than 1 year ago. The problem is controlled. Recent lipid tests were reviewed and are normal. Exacerbating diseases include diabetes. He has no history of hypothyroidism or obesity. Pertinent negatives include no chest pain, myalgias or shortness of breath. Current antihyperlipidemic treatment includes statins. The current treatment provides moderate improvement of lipids. Compliance  problems include adherence to diet.  Risk factors for coronary artery disease include dyslipidemia, hypertension and male sex.      Review of Systems  Constitutional: Negative.   HENT: Negative.   Respiratory: Negative for shortness of breath.   Cardiovascular: Negative for chest pain and palpitations.  Genitourinary: Negative.   Musculoskeletal: Negative for myalgias.  Neurological: Negative for headaches.  Psychiatric/Behavioral: Negative.   All other systems reviewed and are negative.      Objective:   Physical Exam  Constitutional: He is oriented to person, place, and time. He appears well-developed and well-nourished.  HENT:  Head: Normocephalic.  Right Ear: External ear normal.  Left Ear: External ear normal.  Eyes: Conjunctivae are normal. Pupils are equal, round, and reactive to light.  Neck: Normal range of motion. Neck supple. No JVD present.  Cardiovascular: Normal rate, regular rhythm and normal heart sounds.   No murmur heard. Pulmonary/Chest: Effort normal and breath sounds normal.  Abdominal: Soft. Bowel sounds are normal.  Musculoskeletal: Normal range of motion.  Neurological: He is alert and oriented to person, place, and time.  Skin: Skin is warm and dry.  +4/4 monofilament bil No edema    Psychiatric: He has a normal mood and affect. His behavior is normal. Judgment and thought content normal.   BP 116/76 mmHg  Pulse 77  Temp(Src) 99 F (37.2 C) (Oral)  Ht 5' 8"  (1.727 m)  Wt 193 lb (87.544 kg)  BMI 29.35 kg/m2  Results for orders placed or performed in visit on 12/12/14  POCT glycosylated hemoglobin (Hb A1C)  Result Value Ref Range   Hemoglobin A1C 5.9    EKG- [facility]-[name], FNP      Assessment & Plan:  1. Type 2 diabetes mellitus without complication, without long-term current use of  insulin ([place]) Continue to watch carbs  Encouraged to check blood sugars at home - POCT glycosylated hemoglobin (Hb A1C)  2. Essential  hypertension, benign Do not add salt to diet - CMP14+EGFR - EKG 12-Lead  3. BMI 29.0-29.9,adult Discussed diet and exercise for person with BMI >25 Will recheck weight in 3-6 months   4. Hyperlipidemia with target LDL less than 100 Low fat diet - Lipid panel   Hep c test bending Labs pending Health maintenance reviewed Diet and exercise encouraged Continue all meds Follow up  In 3 months   [place], FNP

## 2014-12-13 LAB — CMP14+EGFR
A/G RATIO: 1.3 (ref 1.1–2.5)
ALT: 14 IU/L (ref 0–44)
AST: 12 IU/L (ref 0–40)
Albumin: 3.8 g/dL (ref 3.5–5.5)
Alkaline Phosphatase: 80 IU/L (ref 39–117)
BILIRUBIN TOTAL: 0.4 mg/dL (ref 0.0–1.2)
BUN/Creatinine Ratio: 13 (ref 9–20)
BUN: 13 mg/dL (ref 6–24)
CALCIUM: 9.2 mg/dL (ref 8.7–10.2)
CHLORIDE: 100 mmol/L (ref 97–106)
CO2: 23 mmol/L (ref 18–29)
Creatinine, Ser: 1.03 mg/dL (ref 0.76–1.27)
GFR calc Af Amer: 93 mL/min/{1.73_m2} (ref >59)
GFR, EST NON AFRICAN AMERICAN: 81 mL/min/{1.73_m2} (ref >59)
Globulin, Total: 2.9 g/dL (ref 1.5–4.5)
Glucose: 84 mg/dL (ref 65–99)
POTASSIUM: 4.4 mmol/L (ref 3.5–5.2)
Sodium: 140 mmol/L (ref 136–144)
Total Protein: 6.7 g/dL (ref 6.0–8.5)

## 2014-12-13 LAB — HEPATITIS C ANTIBODY: Hep C Virus Ab: <0.1 s/co ratio (ref 0.0–0.9)

## 2014-12-13 LAB — LIPID PANEL
Chol/HDL Ratio: 2.3 ratio units (ref 0.0–5.0)
Cholesterol, Total: 89 mg/dL — ABNORMAL LOW (ref 100–199)
HDL: 38 mg/dL — ABNORMAL LOW (ref >39)
LDL Calculated: 38 mg/dL (ref 0–99)
Triglycerides: 67 mg/dL (ref 0–149)
VLDL Cholesterol Cal: 13 mg/dL (ref 5–40)

## 2014-12-26 ENCOUNTER — Ambulatory Visit (INDEPENDENT_AMBULATORY_CARE_PROVIDER_SITE_OTHER): Payer: BLUE CROSS/BLUE SHIELD | Admitting: Family Medicine

## 2014-12-26 ENCOUNTER — Encounter: Payer: Self-pay | Admitting: Family Medicine

## 2014-12-26 VITALS — BP 99/60 | HR 82 | Temp 99.0°F | Ht 68.0 in | Wt 192.0 lb

## 2014-12-26 DIAGNOSIS — J029 Acute pharyngitis, unspecified: Secondary | ICD-10-CM | POA: Insufficient documentation

## 2014-12-26 LAB — POCT RAPID STREP A (OFFICE): Rapid Strep A Screen: NEGATIVE

## 2014-12-26 MED ORDER — AMOXICILLIN 500 MG PO CAPS: 500.0000 mg | ORAL_CAPSULE | Freq: Two times a day (BID) | ORAL | Status: DC

## 2014-12-26 NOTE — Patient Instructions (Signed)
Great to meet you!  It looks like you have strep throat even though the test is negative.   Please finish all of the antibiotics  Come back if you get worse or do not get better as expected.   Strep Throat Strep throat is an infection of the throat. It is caused by germs. Strep throat spreads from person to person because of coughing, sneezing, or close contact. HOME CARE Medicines  Take over-the-counter and prescription medicines only as told by your doctor.  Take your antibiotic medicine as told by your doctor. Do not stop taking the medicine even if you feel better.  Have family members who also have a sore throat or fever go to a doctor. Eating and Drinking  Do not share food, drinking cups, or personal items.  Try eating soft foods until your sore throat feels better.  Drink enough fluid to keep your pee (urine) clear or pale yellow. General Instructions  Rinse your mouth (gargle) with a salt-water mixture 3-4 times per day or as needed. To make a salt-water mixture, stir -1 tsp of salt into 1 cup of warm water.  Make sure that all people in your house wash their hands well.  Rest.  Stay home from school or work until you have been taking antibiotics for 24 hours.  Keep all follow-up visits as told by your doctor. This is important. GET HELP IF:  Your neck keeps getting bigger.  You get a rash, cough, or earache.  You cough up thick liquid that is green, yellow-brown, or bloody.  You have pain that does not get better with medicine.  Your problems get worse instead of getting better.  You have a fever. GET HELP RIGHT AWAY IF:  You throw up (vomit).  You get a very bad headache.  You neck hurts or it feels stiff.  You have chest pain or you are short of breath.  You have drooling, very bad throat pain, or changes in your voice.  Your neck is swollen or the skin gets red and tender.  Your mouth is dry or you are peeing less than normal.  You keep  feeling more tired or it is hard to wake up.  Your joints are red or they hurt.   This information is not intended to replace advice given to you by your health care provider. Make sure you discuss any questions you have with your health care provider.   Document Released: 06/16/2007 Document Revised: 09/18/2014 Document Reviewed: 04/22/2014 Elsevier Interactive Patient Education [employer].

## 2014-12-26 NOTE — Progress Notes (Signed)
   HPI  Patient presents today here for acute visit.  He describes  3 days of sore throat, subjective fever and nasal congestion.  Patient denies any dyspnea, chest pain, for nausea.  He has occasional mild headache.   he has no sick contacts  PMH: Smoking status noted ROS: Per HPI  Objective: BP 99/60 mmHg  Pulse 82  Temp(Src) 99 F (37.2 C) (Oral)  Ht 5\' 8"  (1.727 m)  Wt 192 lb (87.091 kg)  BMI 29.20 kg/m2 Gen: NAD, alert, cooperative with exam HEENT: NCAT, tonsils large and erythematous, Tms WNL, nares with some swelling Neck: L sided tender LN in anterior cervical chain CV: RRR, good S1/S2, no murmur Resp: CTABL, no wheezes, non-labored Ext: No edema, warm Neuro: Alert and oriented, No gross deficits  Rapid strep  Assessment and plan:  # Sore throat, likely strep Rapid strep negative, sending culture, he drank just before test.  Tx with amox, tylenol, chloraseptic spray Hold lisinopril for 3 days RTC if worsening or not getting better as expected.     Orders Placed This Encounter  Procedures  . Culture, Group A Strep  . POCT rapid strep A    Meds ordered this encounter  Medications  . amoxicillin (AMOXIL) 500 MG capsule    Sig: Take 1 capsule (500 mg total) by mouth 2 (two) times daily.    Dispense:  20 capsule    Refill:  [place], MD [facility] 12/26/2014, 10:40 AM

## 2014-12-28 LAB — CULTURE, GROUP A STREP: STREP A CULTURE: NEGATIVE

## 2015-01-12 DIAGNOSIS — N390 Urinary tract infection, site not specified: Secondary | ICD-10-CM

## 2015-01-12 HISTORY — DX: Urinary tract infection, site not specified: N39.0

## 2015-01-27 ENCOUNTER — Encounter: Payer: Self-pay | Admitting: Family Medicine

## 2015-01-27 ENCOUNTER — Inpatient Hospital Stay (HOSPITAL_COMMUNITY)
Admission: EM | Admit: 2015-01-27 | Discharge: 2015-01-30 | DRG: 872 | Disposition: A | Discharge status: Home / Self Care | Payer: BLUE CROSS/BLUE SHIELD | Attending: Internal Medicine | Admitting: Internal Medicine

## 2015-01-27 ENCOUNTER — Inpatient Hospital Stay (HOSPITAL_COMMUNITY): Payer: BLUE CROSS/BLUE SHIELD | Admitting: Anesthesiology

## 2015-01-27 ENCOUNTER — Encounter (HOSPITAL_COMMUNITY)
Admission: EM | Disposition: A | Discharge status: Home / Self Care | Payer: Self-pay | Source: Home / Self Care | Attending: Internal Medicine

## 2015-01-27 ENCOUNTER — Emergency Department (HOSPITAL_COMMUNITY): Payer: BLUE CROSS/BLUE SHIELD

## 2015-01-27 ENCOUNTER — Inpatient Hospital Stay (HOSPITAL_COMMUNITY): Payer: BLUE CROSS/BLUE SHIELD

## 2015-01-27 ENCOUNTER — Ambulatory Visit (INDEPENDENT_AMBULATORY_CARE_PROVIDER_SITE_OTHER): Payer: BLUE CROSS/BLUE SHIELD | Admitting: Family Medicine

## 2015-01-27 ENCOUNTER — Encounter (HOSPITAL_COMMUNITY): Payer: Self-pay | Admitting: *Deleted

## 2015-01-27 VITALS — BP 97/59 | HR 98 | Temp 98.8°F | Ht 68.0 in | Wt 192.6 lb

## 2015-01-27 DIAGNOSIS — A419 Sepsis, unspecified organism: Principal | ICD-10-CM | POA: Diagnosis present

## 2015-01-27 DIAGNOSIS — E785 Hyperlipidemia, unspecified: Secondary | ICD-10-CM | POA: Diagnosis present

## 2015-01-27 DIAGNOSIS — N136 Pyonephrosis: Secondary | ICD-10-CM | POA: Diagnosis present

## 2015-01-27 DIAGNOSIS — Z7982 Long term (current) use of aspirin: Secondary | ICD-10-CM | POA: Diagnosis not present

## 2015-01-27 DIAGNOSIS — R509 Fever, unspecified: Secondary | ICD-10-CM | POA: Diagnosis not present

## 2015-01-27 DIAGNOSIS — I10 Essential (primary) hypertension: Secondary | ICD-10-CM | POA: Diagnosis present

## 2015-01-27 DIAGNOSIS — D72829 Elevated white blood cell count, unspecified: Secondary | ICD-10-CM

## 2015-01-27 DIAGNOSIS — N390 Urinary tract infection, site not specified: Secondary | ICD-10-CM | POA: Diagnosis not present

## 2015-01-27 DIAGNOSIS — B9620 Unspecified Escherichia coli [E. coli] as the cause of diseases classified elsewhere: Secondary | ICD-10-CM | POA: Diagnosis present

## 2015-01-27 DIAGNOSIS — E0811 Diabetes mellitus due to underlying condition with ketoacidosis with coma: Secondary | ICD-10-CM | POA: Diagnosis not present

## 2015-01-27 DIAGNOSIS — R6883 Chills (without fever): Secondary | ICD-10-CM | POA: Diagnosis not present

## 2015-01-27 DIAGNOSIS — Z7984 Long term (current) use of oral hypoglycemic drugs: Secondary | ICD-10-CM

## 2015-01-27 DIAGNOSIS — R1032 Left lower quadrant pain: Secondary | ICD-10-CM

## 2015-01-27 DIAGNOSIS — N132 Hydronephrosis with renal and ureteral calculous obstruction: Secondary | ICD-10-CM | POA: Diagnosis present

## 2015-01-27 DIAGNOSIS — Z1611 Resistance to penicillins: Secondary | ICD-10-CM | POA: Diagnosis present

## 2015-01-27 DIAGNOSIS — N179 Acute kidney failure, unspecified: Secondary | ICD-10-CM | POA: Diagnosis present

## 2015-01-27 DIAGNOSIS — E782 Mixed hyperlipidemia: Secondary | ICD-10-CM | POA: Diagnosis present

## 2015-01-27 DIAGNOSIS — N201 Calculus of ureter: Secondary | ICD-10-CM

## 2015-01-27 DIAGNOSIS — E119 Type 2 diabetes mellitus without complications: Secondary | ICD-10-CM | POA: Diagnosis present

## 2015-01-27 HISTORY — PX: CYSTOSCOPY WITH STENT PLACEMENT: SHX5790

## 2015-01-27 LAB — COMPREHENSIVE METABOLIC PANEL
ALBUMIN: 3.4 g/dL — AB (ref 3.5–5.0)
ALK PHOS: 89 U/L (ref 38–126)
ALT: 21 U/L (ref 17–63)
ANION GAP: 10 (ref 5–15)
AST: 26 U/L (ref 15–41)
BUN: 33 mg/dL — ABNORMAL HIGH (ref 6–20)
CALCIUM: 9.4 mg/dL (ref 8.9–10.3)
CHLORIDE: 101 mmol/L (ref 101–111)
CO2: 27 mmol/L (ref 22–32)
Creatinine, Ser: 2.39 mg/dL — ABNORMAL HIGH (ref 0.61–1.24)
GFR calc non Af Amer: 29 mL/min — ABNORMAL LOW (ref >60)
GFR, EST AFRICAN AMERICAN: 33 mL/min — AB (ref >60)
GLUCOSE: 247 mg/dL — AB (ref 65–99)
POTASSIUM: 3.9 mmol/L (ref 3.5–5.1)
SODIUM: 138 mmol/L (ref 135–145)
Total Bilirubin: 1.2 mg/dL (ref 0.3–1.2)
Total Protein: 7.6 g/dL (ref 6.5–8.1)

## 2015-01-27 LAB — CBC
HEMATOCRIT: 36.2 % — AB (ref 39.0–52.0)
HEMOGLOBIN: 12 g/dL — AB (ref 13.0–17.0)
MCH: 28.2 pg (ref 26.0–34.0)
MCHC: 33.1 g/dL (ref 30.0–36.0)
MCV: 85.2 fL (ref 78.0–100.0)
Platelets: 206 10*3/uL (ref 150–400)
RBC: 4.25 MIL/uL (ref 4.22–5.81)
RDW: 15.3 % (ref 11.5–15.5)
WBC: 23 10*3/uL — ABNORMAL HIGH (ref 4.0–10.5)

## 2015-01-27 LAB — URINE MICROSCOPIC-ADD ON

## 2015-01-27 LAB — URINALYSIS, ROUTINE W REFLEX MICROSCOPIC
BILIRUBIN URINE: NEGATIVE
Glucose, UA: 250 mg/dL — AB
Ketones, ur: NEGATIVE mg/dL
Nitrite: NEGATIVE
PH: 5 (ref 5.0–8.0)
Protein, ur: 100 mg/dL — AB
SPECIFIC GRAVITY, URINE: 1.025 (ref 1.005–1.030)

## 2015-01-27 LAB — LIPASE, BLOOD: LIPASE: 16 U/L (ref 11–51)

## 2015-01-27 LAB — GLUCOSE, CAPILLARY: GLUCOSE-CAPILLARY: 101 mg/dL — AB (ref 65–99)

## 2015-01-27 LAB — POCT INFLUENZA A/B
Influenza A, POC: NEGATIVE
Influenza B, POC: NEGATIVE

## 2015-01-27 SURGERY — CYSTOSCOPY, WITH STENT INSERTION
Anesthesia: General | Laterality: Left

## 2015-01-27 MED ORDER — ONDANSETRON HCL 4 MG/2ML IJ SOLN: 4.0000 mg | Freq: Three times a day (TID) | INTRAMUSCULAR | Status: DC | PRN

## 2015-01-27 MED ORDER — SODIUM CHLORIDE 0.9 % IV SOLN
1000.0000 mL | Freq: Once | INTRAVENOUS | Status: AC
  Administered 2015-01-27: 1000 mL via INTRAVENOUS

## 2015-01-27 MED ORDER — LIDOCAINE HCL (CARDIAC) 10 MG/ML IV SOLN
INTRAVENOUS | Status: DC | PRN
  Administered 2015-01-27: 50 mg via INTRAVENOUS

## 2015-01-27 MED ORDER — PROPOFOL 10 MG/ML IV BOLUS
INTRAVENOUS | Status: AC
  Filled 2015-01-27: qty 20

## 2015-01-27 MED ORDER — CIPROFLOXACIN IN D5W 400 MG/200ML IV SOLN
400.0000 mg | Freq: Once | INTRAVENOUS | Status: AC
  Administered 2015-01-27: 400 mg via INTRAVENOUS
  Filled 2015-01-27: qty 200

## 2015-01-27 MED ORDER — ACETAMINOPHEN 650 MG RE SUPP
650.0000 mg | Freq: Once | RECTAL | Status: AC
  Administered 2015-01-27: 650 mg via RECTAL
  Filled 2015-01-27: qty 1

## 2015-01-27 MED ORDER — DEXTROSE 5 % IV SOLN
1.0000 g | INTRAVENOUS | Status: DC
  Administered 2015-01-28 – 2015-01-29 (×2): 1 g via INTRAVENOUS
  Filled 2015-01-27 (×3): qty 10

## 2015-01-27 MED ORDER — MORPHINE SULFATE (PF) 4 MG/ML IV SOLN
4.0000 mg | Freq: Once | INTRAVENOUS | Status: AC
  Administered 2015-01-27: 4 mg via INTRAVENOUS
  Filled 2015-01-27: qty 1

## 2015-01-27 MED ORDER — INSULIN ASPART 100 UNIT/ML ~~LOC~~ SOLN
0.0000 [IU] | SUBCUTANEOUS | Status: DC
  Administered 2015-01-28: 1 [IU] via SUBCUTANEOUS
  Administered 2015-01-28 (×2): 2 [IU] via SUBCUTANEOUS
  Administered 2015-01-29: 1 [IU] via SUBCUTANEOUS
  Administered 2015-01-29: 2 [IU] via SUBCUTANEOUS
  Administered 2015-01-29 (×2): 1 [IU] via SUBCUTANEOUS

## 2015-01-27 MED ORDER — SODIUM CHLORIDE 0.9 % IV SOLN
INTRAVENOUS | Status: AC
  Administered 2015-01-27: 23:00:00 via INTRAVENOUS

## 2015-01-27 MED ORDER — ONDANSETRON HCL 4 MG/2ML IJ SOLN: 4.0000 mg | Freq: Four times a day (QID) | INTRAMUSCULAR | Status: DC | PRN

## 2015-01-27 MED ORDER — IOHEXOL 350 MG/ML SOLN
INTRAVENOUS | Status: DC | PRN
  Administered 2015-01-27: 6 mL via URETHRAL

## 2015-01-27 MED ORDER — SODIUM CHLORIDE 0.9 % IV SOLN
1000.0000 mL | INTRAVENOUS | Status: DC
  Administered 2015-01-27: 21:00:00 via INTRAVENOUS
  Administered 2015-01-27 – 2015-01-30 (×10): 1000 mL via INTRAVENOUS

## 2015-01-27 MED ORDER — SENNOSIDES-DOCUSATE SODIUM 8.6-50 MG PO TABS: 1.0000 | ORAL_TABLET | Freq: Every evening | ORAL | Status: DC | PRN

## 2015-01-27 MED ORDER — SUCCINYLCHOLINE CHLORIDE 20 MG/ML IJ SOLN
INTRAMUSCULAR | Status: DC | PRN
  Administered 2015-01-27: 120 mg via INTRAVENOUS

## 2015-01-27 MED ORDER — HYDROMORPHONE HCL 1 MG/ML IJ SOLN
1.0000 mg | INTRAMUSCULAR | Status: DC | PRN
  Administered 2015-01-28 – 2015-01-29 (×3): 1 mg via INTRAVENOUS
  Filled 2015-01-27 (×3): qty 1

## 2015-01-27 MED ORDER — FENTANYL CITRATE (PF) 100 MCG/2ML IJ SOLN
INTRAMUSCULAR | Status: DC | PRN
  Administered 2015-01-27 (×2): 50 ug via INTRAVENOUS

## 2015-01-27 MED ORDER — ONDANSETRON HCL 4 MG/2ML IJ SOLN
INTRAMUSCULAR | Status: AC
  Filled 2015-01-27: qty 2

## 2015-01-27 MED ORDER — METRONIDAZOLE IN NACL 5-0.79 MG/ML-% IV SOLN
500.0000 mg | Freq: Once | INTRAVENOUS | Status: AC
  Administered 2015-01-27: 500 mg via INTRAVENOUS
  Filled 2015-01-27: qty 100

## 2015-01-27 MED ORDER — DIATRIZOATE MEGLUMINE & SODIUM 66-10 % PO SOLN
ORAL | Status: AC
  Filled 2015-01-27: qty 30

## 2015-01-27 MED ORDER — ONDANSETRON HCL 4 MG/2ML IJ SOLN
4.0000 mg | Freq: Once | INTRAMUSCULAR | Status: AC
  Administered 2015-01-27: 4 mg via INTRAVENOUS
  Filled 2015-01-27: qty 2

## 2015-01-27 MED ORDER — HEPARIN SODIUM (PORCINE) 5000 UNIT/ML IJ SOLN
5000.0000 [IU] | Freq: Three times a day (TID) | INTRAMUSCULAR | Status: DC
  Administered 2015-01-27 – 2015-01-30 (×8): 5000 [IU] via SUBCUTANEOUS
  Filled 2015-01-27 (×7): qty 1

## 2015-01-27 MED ORDER — FENTANYL CITRATE (PF) 100 MCG/2ML IJ SOLN
INTRAMUSCULAR | Status: AC
  Filled 2015-01-27: qty 2

## 2015-01-27 MED ORDER — MIDAZOLAM HCL 2 MG/2ML IJ SOLN
INTRAMUSCULAR | Status: AC
  Filled 2015-01-27: qty 2

## 2015-01-27 MED ORDER — ONDANSETRON HCL 4 MG/2ML IJ SOLN
INTRAMUSCULAR | Status: DC | PRN
  Administered 2015-01-27: 4 mg via INTRAVENOUS

## 2015-01-27 MED ORDER — PROPOFOL 10 MG/ML IV BOLUS
INTRAVENOUS | Status: DC | PRN
  Administered 2015-01-27: 150 mg via INTRAVENOUS
  Administered 2015-01-27: 20 mg via INTRAVENOUS
  Administered 2015-01-27: 50 mg via INTRAVENOUS

## 2015-01-27 MED ORDER — DEXTROSE 5 % IV SOLN
1.0000 g | Freq: Once | INTRAVENOUS | Status: AC
  Administered 2015-01-27: 1 g via INTRAVENOUS
  Filled 2015-01-27: qty 10

## 2015-01-27 MED ORDER — ONDANSETRON HCL 4 MG PO TABS: 4.0000 mg | ORAL_TABLET | Freq: Four times a day (QID) | ORAL | Status: DC | PRN

## 2015-01-27 MED ORDER — ASPIRIN EC 81 MG PO TBEC
81.0000 mg | DELAYED_RELEASE_TABLET | Freq: Every day | ORAL | Status: DC
  Administered 2015-01-28 – 2015-01-30 (×3): 81 mg via ORAL
  Filled 2015-01-27 (×3): qty 1

## 2015-01-27 MED ORDER — ROCURONIUM BROMIDE 100 MG/10ML IV SOLN
INTRAVENOUS | Status: DC | PRN
  Administered 2015-01-27: 5 mg via INTRAVENOUS

## 2015-01-27 MED ORDER — SODIUM CHLORIDE 0.9 % IV BOLUS (SEPSIS): 1000.0000 mL | Freq: Once | INTRAVENOUS | Status: DC

## 2015-01-27 MED ORDER — SODIUM CHLORIDE 0.9 % IR SOLN
Status: DC | PRN
  Administered 2015-01-27: 3000 mL

## 2015-01-27 MED ORDER — MIDAZOLAM HCL 2 MG/2ML IJ SOLN
INTRAMUSCULAR | Status: DC | PRN
  Administered 2015-01-27: 2 mg via INTRAVENOUS

## 2015-01-27 MED ORDER — OMEGA-3-ACID ETHYL ESTERS 1 G PO CAPS
2.0000 g | ORAL_CAPSULE | Freq: Every day | ORAL | Status: DC
Start: 2015-01-28 — End: 2015-01-30
  Administered 2015-01-28 – 2015-01-30 (×3): 2 g via ORAL
  Filled 2015-01-27 (×5): qty 2

## 2015-01-27 MED ORDER — ATORVASTATIN CALCIUM 10 MG PO TABS
10.0000 mg | ORAL_TABLET | Freq: Every day | ORAL | Status: DC
  Administered 2015-01-28 – 2015-01-30 (×3): 10 mg via ORAL
  Filled 2015-01-27 (×3): qty 1

## 2015-01-27 SURGICAL SUPPLY — 18 items
BAG HAMPER (MISCELLANEOUS) ×2 IMPLANT
CATH OPEN TIP 5FR (CATHETERS) ×2 IMPLANT
CATH OPEN TIP 6FR (CATHETERS) IMPLANT
CATH URET 5FR 28IN CONE TIP (BALLOONS)
CATH URET 5FR 28IN OPEN ENDED (CATHETERS) ×2 IMPLANT
CATH URET 5FR 70CM CONE TIP (BALLOONS) IMPLANT
CLOTH BEACON ORANGE TIMEOUT ST (SAFETY) ×2 IMPLANT
GLOVE SURG SS PI 8.0 STRL IVOR (GLOVE) ×2 IMPLANT
GOWN STRL REIN XL XLG (GOWN DISPOSABLE) ×2 IMPLANT
GOWN STRL REUS W/TWL LRG LVL3 (GOWN DISPOSABLE) ×2 IMPLANT
GOWN STRL REUS W/TWL XL LVL3 (GOWN DISPOSABLE) ×2 IMPLANT
GUIDEWIRE STR DUAL SENSOR (WIRE) ×2 IMPLANT
KIT ROOM TURNOVER AP CYSTO (KITS) ×2 IMPLANT
NS IRRIG 500ML POUR BTL (IV SOLUTION) IMPLANT
PACK CYSTO (CUSTOM PROCEDURE TRAY) ×2 IMPLANT
PAD ARMBOARD 7.5X6 YLW CONV (MISCELLANEOUS) ×2 IMPLANT
STENT URET 6FRX26 CONTOUR (STENTS) ×2 IMPLANT
TOWEL OR 17X26 4PK STRL BLUE (TOWEL DISPOSABLE) ×2 IMPLANT

## 2015-01-27 NOTE — Brief Op Note (Signed)
01/27/2015  9:35 PM  PATIENT:  Cameron Noble  55 y.o. male  PRE-OPERATIVE DIAGNOSIS:  Left UPJ stone with obstruction and infection.  POST-OPERATIVE DIAGNOSIS:  Left UPJ stone with obstruction and infection.  PROCEDURE:  Procedure(s) with comments: CYSTOSCOPY WITH STENT PLACEMENT (Left) -   SURGEON:  Surgeon(s) and Role:    * Irine Seal, MD - Primary  PHYSICIAN ASSISTANT:   ASSISTANTS: none   ANESTHESIA:   general  EBL:     BLOOD ADMINISTERED:none  DRAINS: lefet 6 x 26 JJ stent   LOCAL MEDICATIONS USED:  NONE  SPECIMEN:  No Specimen  DISPOSITION OF SPECIMEN:  N/A  COUNTS:  YES  TOURNIQUET:  * No tourniquets in log *  DICTATION: .Other Dictation: Dictation Number [id number]  PLAN OF CARE: Admit to inpatient   PATIENT DISPOSITION:  PACU - hemodynamically stable.   Delay start of Pharmacological VTE agent (>24hrs) due to surgical blood loss or risk of bleeding: not applicable

## 2015-01-27 NOTE — ED Provider Notes (Signed)
CSN: [id number]     Arrival date & time 01/27/15  1045 History   First MD Initiated Contact with Patient 01/27/15 1408     Chief Complaint  Patient presents with  . Abdominal Pain     (Consider location/radiation/quality/duration/timing/severity/associated sxs/prior Treatment) Patient is a 55 y.o. male presenting with abdominal pain. The history is provided by the patient.  Abdominal Pain He has had left lower abdominal pain for the last 2 weeks. Pain is sharp and is getting worse. He currently rates pain at 7/10. It is worse with certain movements and walking. Nothing makes it better. He has had some mild nausea but no vomiting. He has had mild diarrhea for the last 3 days. He has had subjective fever and some sweats but no chills. He saw his physician today who sent him to the ED to rule out diverticulitis. Apparently, he had a colonoscopy in 2011 which showed diverticulosis.  Past Medical History  Diagnosis Date  . Allergic rhinitis   . DDD (degenerative disc disease), cervical   . Diverticulosis   . Diabetes mellitus   . Hypertension   . Hyperlipidemia   . URI (upper respiratory infection)    Past Surgical History  Procedure Laterality Date  . Back surgery  03/2002    Dr. Trenton Gammon   . Ddd c-spine repair     No family history on file. Social History  Substance Use Topics  . Smoking status: Never Smoker   . Smokeless tobacco: None  . Alcohol Use: Yes     Comment: Occasional     Review of Systems  Gastrointestinal: Positive for abdominal pain.  All other systems reviewed and are negative.     Allergies  Review of patient's allergies indicates no known allergies.  Home Medications   Prior to Admission medications   Medication Sig Start Date End Date Taking? Authorizing Provider  aspirin 81 MG tablet Take 81 mg by mouth daily.    Historical Provider, MD  atorvastatin (LIPITOR) 10 MG tablet Take 1 tablet (10 mg total) by mouth daily. 08/12/14   [name], FNP   fish oil-omega-3 fatty acids 1000 MG capsule Take 2 g by mouth daily.    Historical Provider, MD  fluticasone (FLONASE) 50 MCG/ACT nasal spray Place 2 sprays into both nostrils daily. 04/23/13   [name], FNP  lisinopril (PRINIVIL,ZESTRIL) 10 MG tablet Take 1 tablet (10 mg total) by mouth daily. 08/12/14   [name], FNP  metFORMIN (GLUCOPHAGE) 1000 MG tablet Take 1 tablet (1,000 mg total) by mouth 2 (two) times daily with a meal. 08/12/14   [name], FNP  VITAMIN D, ERGOCALCIFEROL, PO Take by mouth.    Historical Provider, MD   BP 124/72 mmHg  Pulse 95  Temp(Src) 98.2 F (36.8 C) (Oral)  Ht 5\' 7"  (1.702 m)  Wt 192 lb (87.091 kg)  BMI 30.06 kg/m2  SpO2 100% Physical Exam  Nursing note and vitals reviewed.  55 year old male, resting comfortably and in no acute distress. Vital signs are normal. Oxygen saturation is 100%, which is normal. Head is normocephalic and atraumatic. PERRLA, EOMI. Oropharynx is clear. Neck is nontender and supple without adenopathy or JVD. Back is nontender and there is no CVA tenderness. Lungs are clear without rales, wheezes, or rhonchi. Chest is nontender. Heart has regular rate and rhythm without murmur. Abdomen is soft, flat, with moderate left lower quadrant tenderness. There is no rebound or guarding. There are no masses or hepatosplenomegaly and peristalsis is hypoactive.  Extremities have no cyanosis or edema, full range of motion is present. Skin is warm and dry without rash. Neurologic: Mental status is normal, cranial nerves are intact, there are no motor or sensory deficits.  ED Course  Procedures (including critical care time) Labs Review Results for orders placed or performed during the hospital encounter of 01/27/15  Lipase, blood  Result Value Ref Range   Lipase 16 11 - 51 U/L  Comprehensive metabolic panel  Result Value Ref Range   Sodium 138 135 - 145 mmol/L   Potassium 3.9 3.5 - 5.1 mmol/L   Chloride 101  101 - 111 mmol/L   CO2 27 22 - 32 mmol/L   Glucose, Bld 247 (H) 65 - 99 mg/dL   BUN 33 (H) 6 - 20 mg/dL   Creatinine, Ser 2.39 (H) 0.61 - 1.24 mg/dL   Calcium 9.4 8.9 - 10.3 mg/dL   Total Protein 7.6 6.5 - 8.1 g/dL   Albumin 3.4 (L) 3.5 - 5.0 g/dL   AST 26 15 - 41 U/L   ALT 21 17 - 63 U/L   Alkaline Phosphatase 89 38 - 126 U/L   Total Bilirubin 1.2 0.3 - 1.2 mg/dL   GFR calc non Af Amer 29 (L) >60 mL/min   GFR calc Af Amer 33 (L) >60 mL/min   Anion gap 10 5 - 15  CBC  Result Value Ref Range   WBC 23.0 (H) 4.0 - 10.5 K/uL   RBC 4.25 4.22 - 5.81 MIL/uL   Hemoglobin 12.0 (L) 13.0 - 17.0 g/dL   HCT 36.2 (L) 39.0 - 52.0 %   MCV 85.2 78.0 - 100.0 fL   MCH 28.2 26.0 - 34.0 pg   MCHC 33.1 30.0 - 36.0 g/dL   RDW 15.3 11.5 - 15.5 %   Platelets 206 150 - 400 K/uL  Urinalysis, Routine w reflex microscopic (not at [facility])  Result Value Ref Range   Color, Urine YELLOW YELLOW   APPearance CLOUDY (A) CLEAR   Specific Gravity, Urine 1.025 1.005 - 1.030   pH 5.0 5.0 - 8.0   Glucose, UA 250 (A) NEGATIVE mg/dL   Hgb urine dipstick LARGE (A) NEGATIVE   Bilirubin Urine NEGATIVE NEGATIVE   Ketones, ur NEGATIVE NEGATIVE mg/dL   Protein, ur 100 (A) NEGATIVE mg/dL   Nitrite NEGATIVE NEGATIVE   Leukocytes, UA MODERATE (A) NEGATIVE  Urine microscopic-add on  Result Value Ref Range   Squamous Epithelial / LPF 0-5 (A) NONE SEEN   WBC, UA TOO NUMEROUS TO COUNT 0 - 5 WBC/hpf   RBC / HPF 6-30 0 - 5 RBC/hpf   Bacteria, UA MANY (A) NONE SEEN   Imaging Review Ct Abdomen Pelvis Wo Contrast  01/27/2015  CLINICAL DATA:  Left lower quadrant pain for 2 weeks. Elevated creatinine. Diarrhea. EXAM: CT ABDOMEN AND PELVIS WITHOUT CONTRAST TECHNIQUE: Multidetector CT imaging of the abdomen and pelvis was performed following the standard protocol without IV contrast. COMPARISON:  None. FINDINGS: Lower chest: No significant pulmonary nodules or acute consolidative airspace disease. Hepatobiliary: Normal liver with no  liver mass. Normal gallbladder with no radiopaque cholelithiasis. No biliary ductal dilatation. Pancreas: Normal, with no mass or duct dilation. Spleen: Normal size. No mass. Adrenals/Urinary Tract: Normal adrenals. There is an obstructing 10 x 8 x 13 mm stone at the left ureteropelvic junction with mild left hydronephrosis, asymmetric enlargement of the left kidney and left perinephric fat stranding. No right hydronephrosis. No additional urolithiasis. No contour deforming renal masses. Stomach/Bowel: Grossly  normal stomach. Normal caliber small bowel with no small bowel wall thickening. Normal appendix. Normal large bowel with no diverticulosis, large bowel wall thickening or pericolonic fat stranding. Vascular/Lymphatic: Normal caliber abdominal aorta. No pathologically enlarged lymph nodes in the abdomen or pelvis. Reproductive: Mild prostatomegaly. Other: No pneumoperitoneum, ascites or focal fluid collection. Musculoskeletal: No aggressive appearing focal osseous lesions. Severe degenerative disc disease in the lower lumbar spine. Small fat containing umbilical hernia. IMPRESSION: 1. Obstructing 10 x 8 x 13 mm stone at the left UPJ with mild left hydronephrosis and left renal and perinephric edema. 2. Mild prostatomegaly. Electronically Signed   By: Ilona Sorrel M.D.   On: 01/27/2015 16:44   I have personally reviewed and evaluated these images and lab results as part of my medical decision-making.   MDM   Final diagnoses:  Hydronephrosis with urinary obstruction due to ureteral calculus  Urinary tract infection without hematuria, site unspecified  Acute kidney injury (nontraumatic) (HCC)  Leukocytosis    Left lower quadrant pain suspicious for diverticulitis. WBC is come back markedly elevated at 23,000. Mild anemia is present with no prior CBCs on record. Creatinine and BUN are noted to be elevated and this is a new finding relative to December 1. He is given IV hydration will be sent for CT of  abdomen and pelvis without IV contrast. He is empirically started on ciprofloxacin and metronidazole.  Urinalysis is come back with urine it appears infected with many bacteria and too numerous to count WBCs. Urine is sent for culture. CT has come back showing large proximal left ureteral calculus with obstruction. Case is discussed with Dr. Jeffie Pollock of urology service who states he will come to place stent and request patient be admitted to hospitalist service. Patient did spike a fever to 102 and ceftriaxone was added to his antibiotic regimen. Case is discussed with Dr. Marin Comment of [facility] who agrees to admit the patient.  Delora Fuel, MD [id number] [phone]

## 2015-01-27 NOTE — Transfer of Care (Signed)
Immediate Anesthesia Transfer of Care Note  Patient: Cameron Noble  Procedure(s) Performed: Procedure(s) with comments: CYSTOSCOPY, RETROGRADE, WITH LEFT STENT PLACEMENT (Left) - I have 2 cases at [facility] and a foley to place at [facility].  I would guess it will be 830-9 before I get up to AP.   Patient Location: PACU  Anesthesia Type:General  Level of Consciousness: awake and patient cooperative  Airway & Oxygen Therapy: Patient Spontanous Breathing  Post-op Assessment: Report given to RN, Post -op Vital signs reviewed and stable and Patient moving all extremities  Post vital signs: Reviewed and stable   Complications: No apparent anesthesia complications

## 2015-01-27 NOTE — ED Notes (Signed)
Pt was sent by PCP to rule out diverticulitis. Pt is having LLQ pain starting 2 weeks ago. Pt denies any n/v, pt has been having diarrhea in the last 3-4 days. NAD noted.

## 2015-01-27 NOTE — Anesthesia Postprocedure Evaluation (Signed)
Anesthesia Post Note  Patient: Cameron Noble  Procedure(s) Performed: Procedure(s) (LRB): CYSTOSCOPY, RETROGRADE, WITH LEFT STENT PLACEMENT (Left)  Patient location during evaluation: PACU Anesthesia Type: General Level of consciousness: awake and alert Pain management: pain level controlled Vital Signs Assessment: post-procedure vital signs reviewed and stable Respiratory status: spontaneous breathing Cardiovascular status: blood pressure returned to baseline Anesthetic complications: no    Last Vitals:  Filed Vitals:   01/27/15 2215 01/27/15 2220  BP: 115/73   Pulse: 89 87  Temp:    Resp: 13 24    Last Pain:  Filed Vitals:   01/27/15 2221  PainSc: 0-No pain                 Vikki Gains

## 2015-01-27 NOTE — H&P (Signed)
[facility] History and Physical  Jitsuo Hashim [id number] DOB: 1960-02-28    PCP:   Chevis Pretty, FNP   Chief Complaint: Left sided abdominal pain for 2 weeks.   HPI: Cameron Noble is an 55 y.o. male with hx of diverticulosis, DM on Metformin, HTN on Lisinopril, HLD, presented to the PCP with progressive left sided abdominal and flank pain.  He was thought to have diverticulitis, and was initially given IV Cipro and IV Flagyl in the ER.  An abdominal Pelvic CT, however, showed left UPJ kidney stone with hydronephrosis.  His UA showed a UTI with TNTC WBCs and glycouria.  His serology showed AKI, with Cr of 2.4 (normal baseline), BS of 247, normal K, and a marked leukocytosis with WBC of 23K.  Urology was consulted by EDP, and Dr Jimmy Footman is aware of the admission.  Hospitalist was asked to admit him for AKI due to post obstructive uropathy because of stone hydronephrosis and a UTI.    Rewiew of Systems:  Constitutional: Negative for malaise, fever and chills. No significant weight loss or weight gain Eyes: Negative for eye pain, redness and discharge, diplopia, visual changes, or flashes of light. ENMT: Negative for ear pain, hoarseness, nasal congestion, sinus pressure and sore throat. No headaches; tinnitus, drooling, or problem swallowing. Cardiovascular: Negative for chest pain, palpitations, diaphoresis, dyspnea and peripheral edema. ; No orthopnea, PND Respiratory: Negative for cough, hemoptysis, wheezing and stridor. No pleuritic chestpain. Gastrointestinal: Negative for nausea, vomiting,  constipation,  melena, blood in stool, hematemesis, jaundice and rectal bleeding.    Genitourinary: Negative for frequency, dysuria, incontinence,flank pain and hematuria; Musculoskeletal: Negative for back pain and neck pain. Negative for swelling and trauma.;  Skin: . Negative for pruritus, rash, abrasions, bruising and skin lesion.; ulcerations Neuro: Negative for headache,  lightheadedness and neck stiffness. Negative for weakness, altered level of consciousness , altered mental status, extremity weakness, burning feet, involuntary movement, seizure and syncope.  Psych: negative for anxiety, depression, insomnia, tearfulness, panic attacks, hallucinations, paranoia, suicidal or homicidal ideation    Past Medical History  Diagnosis Date  . Allergic rhinitis   . DDD (degenerative disc disease), cervical   . Diverticulosis   . Diabetes mellitus   . Hypertension   . Hyperlipidemia   . URI (upper respiratory infection)     Past Surgical History  Procedure Laterality Date  . Back surgery  03/2002    Dr. Trenton Gammon   . Ddd c-spine repair      Medications:  HOME MEDS: Prior to Admission medications   Medication Sig Start Date End Date Taking? Authorizing Provider  aspirin 81 MG tablet Take 81 mg by mouth daily.   Yes Historical Provider, MD  atorvastatin (LIPITOR) 10 MG tablet Take 1 tablet (10 mg total) by mouth daily. 08/12/14  Yes [name], FNP  fish oil-omega-3 fatty acids 1000 MG capsule Take 2 g by mouth daily.   Yes Historical Provider, MD  fluticasone (FLONASE) 50 MCG/ACT nasal spray Place 2 sprays into both nostrils daily. 04/23/13  Yes [name], FNP  lisinopril (PRINIVIL,ZESTRIL) 10 MG tablet Take 1 tablet (10 mg total) by mouth daily. 08/12/14  Yes [name], FNP  metFORMIN (GLUCOPHAGE) 1000 MG tablet Take 1 tablet (1,000 mg total) by mouth 2 (two) times daily with a meal. 08/12/14  Yes [name], FNP  VITAMIN D, ERGOCALCIFEROL, PO Take by mouth.   Yes Historical Provider, MD     Allergies:  No Known Allergies  Social History:   reports  that he has never smoked. He does not have any smokeless tobacco history on file. He reports that he drinks alcohol. He reports that he does not use illicit drugs.  Family History: History reviewed. No pertinent family history.   Physical Exam: Filed Vitals:   01/27/15  1056 01/27/15 1554 01/27/15 1749  BP: 124/72 134/78 131/77  Pulse: 95 92 96  Temp: 98.2 F (36.8 C)  102.4 F (39.1 C)  TempSrc: Oral  Oral  Resp:  18 16  Height: 5\' 7"  (1.702 m)    Weight: 87.091 kg (192 lb)    SpO2: 100% 99% 100%   Blood pressure 131/77, pulse 96, temperature 102.4 F (39.1 C), temperature source Oral, resp. rate 16, height 5\' 7"  (1.702 m), weight 87.091 kg (192 lb), SpO2 100 %.  GEN:  Pleasant  patient lying in the stretcher in no acute distress; cooperative with exam. PSYCH:  alert and oriented x4; does not appear anxious or depressed; affect is appropriate. HEENT: Mucous membranes pink and anicteric; PERRLA; EOM intact; no cervical lymphadenopathy nor thyromegaly or carotid bruit; no JVD; There were no stridor. Neck is very supple. Breasts:: Not examined CHEST WALL: No tenderness CHEST: Normal respiration, clear to auscultation bilaterally.  HEART: Regular rate and rhythm.  There are no murmur, rub, or gallops.   BACK: No kyphosis or scoliosis; no CVA tenderness ABDOMEN: soft and non-tender; no masses, no organomegaly, normal abdominal bowel sounds; no pannus; no intertriginous candida. There is no rebound and no distention. Rectal Exam: Not done EXTREMITIES: No bone or joint deformity; age-appropriate arthropathy of the hands and knees; no edema; no ulcerations.  There is no calf tenderness. Genitalia: not examined PULSES: 2+ and symmetric SKIN: Normal hydration no rash or ulceration CNS: Cranial nerves 2-12 grossly intact no focal lateralizing neurologic deficit.  Speech is fluent; uvula elevated with phonation, facial symmetry and tongue midline. DTR are normal bilaterally, cerebella exam is intact, barbinski is negative and strengths are equaled bilaterally.  No sensory loss.   Labs on Admission:  Basic Metabolic Panel:  Recent Labs Lab 01/27/15 1112  NA 138  K 3.9  CL 101  CO2 27  GLUCOSE 247*  BUN 33*  CREATININE 2.39*  CALCIUM 9.4   Liver  Function Tests:  Recent Labs Lab 01/27/15 1112  AST 26  ALT 21  ALKPHOS 89  BILITOT 1.2  PROT 7.6  ALBUMIN 3.4*    Recent Labs Lab 01/27/15 1112  LIPASE 16   CBC:  Recent Labs Lab 01/27/15 1112  WBC 23.0*  HGB 12.0*  HCT 36.2*  MCV 85.2  PLT 206   Radiological Exams on Admission: Ct Abdomen Pelvis Wo Contrast  01/27/2015  CLINICAL DATA:  Left lower quadrant pain for 2 weeks. Elevated creatinine. Diarrhea. EXAM: CT ABDOMEN AND PELVIS WITHOUT CONTRAST TECHNIQUE: Multidetector CT imaging of the abdomen and pelvis was performed following the standard protocol without IV contrast. COMPARISON:  None. FINDINGS: Lower chest: No significant pulmonary nodules or acute consolidative airspace disease. Hepatobiliary: Normal liver with no liver mass. Normal gallbladder with no radiopaque cholelithiasis. No biliary ductal dilatation. Pancreas: Normal, with no mass or duct dilation. Spleen: Normal size. No mass. Adrenals/Urinary Tract: Normal adrenals. There is an obstructing 10 x 8 x 13 mm stone at the left ureteropelvic junction with mild left hydronephrosis, asymmetric enlargement of the left kidney and left perinephric fat stranding. No right hydronephrosis. No additional urolithiasis. No contour deforming renal masses. Stomach/Bowel: Grossly normal stomach. Normal caliber small bowel with no  small bowel wall thickening. Normal appendix. Normal large bowel with no diverticulosis, large bowel wall thickening or pericolonic fat stranding. Vascular/Lymphatic: Normal caliber abdominal aorta. No pathologically enlarged lymph nodes in the abdomen or pelvis. Reproductive: Mild prostatomegaly. Other: No pneumoperitoneum, ascites or focal fluid collection. Musculoskeletal: No aggressive appearing focal osseous lesions. Severe degenerative disc disease in the lower lumbar spine. Small fat containing umbilical hernia. IMPRESSION: 1. Obstructing 10 x 8 x 13 mm stone at the left UPJ with mild left  hydronephrosis and left renal and perinephric edema. 2. Mild prostatomegaly. Electronically Signed   By: Ilona Sorrel M.D.   On: 01/27/2015 16:44    EKG: Independently reviewed.   Assessment/Plan Present on Admission:  . Hydronephrosis with urinary obstruction due to ureteral calculus . Hyperlipidemia with target LDL less than 100 . Essential hypertension, benign . Hydronephrosis  PLAN:  AKI:  Per EDP,  Dr Roni Bread is planning on taking this patient for a ureteral stent placement to relieve his left hydronephrosis.  Will continue with IVF and made NPO for now.  Follow his Cr carefully.  D/C lisinopril and Metformin at this time.   UTI:  Will give Rocephin.  DM:  Metformin d/c.  Will use sensitive SSI.    HTN:  Hold ACE I in the setting of AKI.  If and when AKI resolves, should restart his ACE I for diabetic hypertension.   Other plans as per orders.  Code Status: FULL CODE>    Orvan Falconer, MD. FACP [facility] Pager [phone] 7pm to 7am.  01/27/2015, 5:58 PM

## 2015-01-27 NOTE — H&P (Signed)
Subjective: Cameron Noble is a 55 yo BM who I was asked to see in consultation by Dr. Marin Comment for a LUPJ stone with fever.  He had the onset of pain about 2 weeks ago and began to have fever and chills over the weekend.  His urine is infected and his WBC count is 23K.  He has had no prior stones.   He has a 19mm stone at the left UPJ with obstruction.  He also has ARI.   ROS:  Review of Systems  Constitutional: Positive for fever and chills.  Respiratory: Negative for shortness of breath.   Cardiovascular: Negative for chest pain.  Gastrointestinal: Positive for nausea. Negative for vomiting.  Genitourinary: Positive for flank pain. Negative for dysuria.  All other systems reviewed and are negative.   No Known Allergies  Past Medical History  Diagnosis Date  . Allergic rhinitis   . DDD (degenerative disc disease), cervical   . Diverticulosis   . Diabetes mellitus   . Hypertension   . Hyperlipidemia   . URI (upper respiratory infection)     Past Surgical History  Procedure Laterality Date  . Back surgery  03/2002    Dr. Trenton Gammon   . Ddd c-spine repair      Social History   Social History  . Marital Status: Married    Spouse Name: N/A  . Number of Children: N/A  . Years of Education: N/A   Occupational History  . Not on file.   Social History Main Topics  . Smoking status: Never Smoker   . Smokeless tobacco: Not on file  . Alcohol Use: Yes     Comment: Occasional   . Drug Use: No  . Sexual Activity: Not on file   Other Topics Concern  . Not on file   Social History Narrative    History reviewed. No pertinent family history.  Anti-infectives: Anti-infectives    Start     Dose/Rate Route Frequency Ordered Stop   01/27/15 1800  cefTRIAXone (ROCEPHIN) 1 g in dextrose 5 % 50 mL IVPB     1 g 100 mL/hr over 30 Minutes Intravenous  Once 01/27/15 1745 01/27/15 1940   01/27/15 1430  ciprofloxacin (CIPRO) IVPB 400 mg     400 mg 200 mL/hr over 60 Minutes Intravenous   Once 01/27/15 1417 01/27/15 1546   01/27/15 1430  metroNIDAZOLE (FLAGYL) IVPB 500 mg     500 mg 100 mL/hr over 60 Minutes Intravenous  Once 01/27/15 1417 01/27/15 1708      Current Facility-Administered Medications  Medication Dose Route Frequency Provider Last Rate Last Dose  . 0.9 %  sodium chloride infusion  1,000 mL Intravenous Continuous Delora Fuel, MD   Stopped at 01/27/15 1940  . diatrizoate meglumine-sodium (GASTROGRAFIN) 66-10 % solution           . sodium chloride 0.9 % bolus 1,000 mL  1,000 mL Intravenous Once Delora Fuel, MD 0000000 mL/hr at 01/27/15 1755 1,000 mL at 01/27/15 1755   Current Outpatient Prescriptions  Medication Sig Dispense Refill  . aspirin 81 MG tablet Take 81 mg by mouth daily.    Marland Kitchen atorvastatin (LIPITOR) 10 MG tablet Take 1 tablet (10 mg total) by mouth daily. 90 tablet 1  . fish oil-omega-3 fatty acids 1000 MG capsule Take 2 g by mouth daily.    . fluticasone (FLONASE) 50 MCG/ACT nasal spray Place 2 sprays into both nostrils daily. 16 g 2  . lisinopril (PRINIVIL,ZESTRIL) 10 MG tablet Take  1 tablet (10 mg total) by mouth daily. 90 tablet 1  . metFORMIN (GLUCOPHAGE) 1000 MG tablet Take 1 tablet (1,000 mg total) by mouth 2 (two) times daily with a meal. 180 tablet 1  . VITAMIN D, ERGOCALCIFEROL, PO Take by mouth.     Past, family and social history reviewed and updated.   Objective: Vital signs in last 24 hours: Temp:  [98.2 F (36.8 C)-102.4 F (39.1 C)] 100.2 F (37.9 C) (01/16 1942) Pulse Rate:  [92-102] 99 (01/16 1942) Resp:  [16-18] 16 (01/16 1942) BP: (97-134)/(50-78) 107/50 mmHg (01/16 1942) SpO2:  [96 %-100 %] 97 % (01/16 1942) Weight:  [87.091 kg (192 lb)-87.363 kg (192 lb 9.6 oz)] 87.091 kg (192 lb) (01/16 1056)  Intake/Output from previous day:   Intake/Output this shift:     Physical Exam  Constitutional: He is oriented to person, place, and time and well-developed, well-nourished, and in no distress.  HENT:  Head: Normocephalic  and atraumatic.  Neck: Normal range of motion. Neck supple. No thyromegaly present.  Cardiovascular: Normal rate, regular rhythm and normal heart sounds.   Pulmonary/Chest: Effort normal and breath sounds normal. No respiratory distress.  Abdominal: Soft. Bowel sounds are normal. He exhibits no mass. There is tenderness. There is guarding.  Musculoskeletal: Normal range of motion. He exhibits no edema or tenderness.  Lymphadenopathy:    He has cervical adenopathy.       Right: No supraclavicular adenopathy present.       Left: No supraclavicular adenopathy present.  Neurological: He is alert and oriented to person, place, and time.  Skin: Skin is warm and dry.  Psychiatric: Mood and affect normal.  Vitals reviewed.   Lab Results:   Recent Labs  01/27/15 1112  WBC 23.0*  HGB 12.0*  HCT 36.2*  PLT 206   BMET  Recent Labs  01/27/15 1112  NA 138  K 3.9  CL 101  CO2 27  GLUCOSE 247*  BUN 33*  CREATININE 2.39*  CALCIUM 9.4   PT/INR No results for input(s): LABPROT, INR in the last 72 hours. ABG No results for input(s): PHART, HCO3 in the last 72 hours.  Invalid input(s): PCO2, PO2  Studies/Results: Ct Abdomen Pelvis Wo Contrast  01/27/2015  CLINICAL DATA:  Left lower quadrant pain for 2 weeks. Elevated creatinine. Diarrhea. EXAM: CT ABDOMEN AND PELVIS WITHOUT CONTRAST TECHNIQUE: Multidetector CT imaging of the abdomen and pelvis was performed following the standard protocol without IV contrast. COMPARISON:  None. FINDINGS: Lower chest: No significant pulmonary nodules or acute consolidative airspace disease. Hepatobiliary: Normal liver with no liver mass. Normal gallbladder with no radiopaque cholelithiasis. No biliary ductal dilatation. Pancreas: Normal, with no mass or duct dilation. Spleen: Normal size. No mass. Adrenals/Urinary Tract: Normal adrenals. There is an obstructing 10 x 8 x 13 mm stone at the left ureteropelvic junction with mild left hydronephrosis,  asymmetric enlargement of the left kidney and left perinephric fat stranding. No right hydronephrosis. No additional urolithiasis. No contour deforming renal masses. Stomach/Bowel: Grossly normal stomach. Normal caliber small bowel with no small bowel wall thickening. Normal appendix. Normal large bowel with no diverticulosis, large bowel wall thickening or pericolonic fat stranding. Vascular/Lymphatic: Normal caliber abdominal aorta. No pathologically enlarged lymph nodes in the abdomen or pelvis. Reproductive: Mild prostatomegaly. Other: No pneumoperitoneum, ascites or focal fluid collection. Musculoskeletal: No aggressive appearing focal osseous lesions. Severe degenerative disc disease in the lower lumbar spine. Small fat containing umbilical hernia. IMPRESSION: 1. Obstructing 10 x 8 x 13  mm stone at the left UPJ with mild left hydronephrosis and left renal and perinephric edema. 2. Mild prostatomegaly. Electronically Signed   By: Ilona Sorrel M.D.   On: 01/27/2015 16:44     Assessment: Large left UPJ stone with infection.  I am going to place a left ureteral stent and reviewed the risks of bleeding,infection, ureteral injury,need for secondary procedures, possible need for a percutaneous nephrostomy, thrombotic events and anesthetic complications.     CC: Dr. Orvan Falconer.      [place] Wargo J 01/27/2015 [phone]

## 2015-01-27 NOTE — ED Notes (Signed)
Urologist to come in for stent placement due to left uretal obstruction. AC aware that urologist is expected to be here ~2030. EDP aware on current fever.

## 2015-01-27 NOTE — Progress Notes (Signed)
   HPI  Patient presents today here to discuss left lower quadrant pain.  Patient explains that he's had sharp intermittent left lower quadrant pain for about 2 weeks. Over the weekend he developed subjective fever, chills, sweats, and anorexia.  This morning at his workplace he is evaluated and told that he had a fast heart rate and sent to the doctor's office for evaluation.  He states that he has not eaten any food today. He is tolerating fluids okay. He has had diarrhea.  He has no dysuria.  PMH: Smoking status noted ROS: Per HPI  Objective: BP 97/59 mmHg  Pulse 98  Temp(Src) 98.8 F (37.1 C) (Oral)  Ht 5\' 8"  (1.727 m)  Wt 192 lb 9.6 oz (87.363 kg)  BMI 29.29 kg/m2 Gen: NAD, alert, cooperative with exam HEENT: NCAT, MMM CV: RRR, good S1/S2, no murmur Resp: CTABL, no wheezes, non-labored Abd: Soft, tenderness to palpation left lower quadrant, mild rebound tenderness, no guarding, no CVA tenderness Ext: No edema, warm Neuro: Alert and oriented, No gross deficits  Assessment and plan:  # Left lower quadrant pain. On exam he has moderate tenderness to palpation of his left lower quadrant. He does have a colonoscopy from 2011 with moderate diverticulitis. His blood pressures are soft and he is on lisinopril and metformin. Considering his soft BP, abdominal pain, and the fact that he is on an ACE inhibitor I think that he should probably have labs prior to going home, consider CT of the abdomen, I think the most likely etiology is diverticulitis. Given his entire picture of recommended that he get a emergency room, he feels comfortable to drive there and he is non-toxic-appearing so we have sent him over.   Orders Placed This Encounter  Procedures  . POCT Influenza A/B    No orders of the defined types were placed in this encounter.    Laroy Apple, MD [facility] 01/27/2015, 9:43 AM

## 2015-01-27 NOTE — Anesthesia Procedure Notes (Signed)
Procedure Name: Intubation Date/Time: 01/27/2015 9:13 PM Performed by: Vista Deck Pre-anesthesia Checklist: Patient identified, Patient being monitored, Timeout performed, Emergency Drugs available and Suction available Patient Re-evaluated:Patient Re-evaluated prior to inductionOxygen Delivery Method: Circle System Utilized Preoxygenation: Pre-oxygenation with 100% oxygen Intubation Type: IV induction, Rapid sequence and Cricoid Pressure applied Ventilation: Mask ventilation without difficulty Laryngoscope Size: Miller and 2 Grade View: Grade I Tube type: Oral Tube size: 7.0 mm Number of attempts: 1 Airway Equipment and Method: Stylet and Oral airway Placement Confirmation: ETT inserted through vocal cords under direct vision,  positive ETCO2 and breath sounds checked- equal and bilateral Secured at: 23 cm Tube secured with: Tape Dental Injury: Teeth and Oropharynx as per pre-operative assessment

## 2015-01-27 NOTE — Anesthesia Preprocedure Evaluation (Addendum)
Anesthesia Evaluation  Patient identified by MRN, date of birth, ID band Patient awake    Reviewed: Allergy & Precautions, NPO status , Patient's Chart, lab work & pertinent test results  Airway Mallampati: II  TM Distance: >3 FB Neck ROM: Full    Dental  (+) Teeth Intact, Dental Advisory Given   Pulmonary Recent URI , Residual Cough,  Sinus drainage          Cardiovascular Exercise Tolerance: Good hypertension, Pt. on medications      Neuro/Psych    GI/Hepatic Patient received Oral Contrast Agents,  Endo/Other  diabetes, Well Controlled, Type 2, Oral Hypoglycemic Agents  Renal/GU Renal disease     Musculoskeletal  (+) Arthritis ,   Abdominal   Peds  Hematology   Anesthesia Other Findings   Reproductive/Obstetrics                            Anesthesia Physical Anesthesia Plan  ASA: II and emergent  Anesthesia Plan: General   Post-op Pain Management:    Induction: Intravenous  Airway Management Planned: Oral ETT  Additional Equipment:   Intra-op Plan:   Post-operative Plan: Extubation in OR  Informed Consent: I have reviewed the patients History and Physical, chart, labs and discussed the procedure including the risks, benefits and alternatives for the proposed anesthesia with the patient or authorized representative who has indicated his/her understanding and acceptance.     Plan Discussed with: Surgeon  Anesthesia Plan Comments:         Anesthesia Quick Evaluation

## 2015-01-27 NOTE — Patient Instructions (Signed)
Great to meet you!  I think you should be evaluated at the ER at [facility], I will call ahead to let them know you are coming.

## 2015-01-27 NOTE — Discharge Instructions (Signed)
Ureteral Stent Implantation Ureteral stent implantation is the implantation of a soft plastic tube with multiple holes into the tube that drains urine from your kidney to your bladder (ureter). The stent helps drain your kidney when there is a blockage of the flow of urine in your ureter. The stent has a coil on each end to keep it from falling out. One end stays in the kidney. The other end stays in the bladder. It is most often taken out after any blockage has been removed or your ureter has healed. Short-term stents have a string attached to make removal quite easy. Removal of a short-term stent can be done in your health care provider's office or by you at home. Long-term stents need to be changed every few months. LET [facility] CARE PROVIDER KNOW ABOUT:  Any allergies you have.  All medicines you are taking, including vitamins, herbs, eye drops, creams, and over-the-counter medicines.  Previous problems you or members of your family have had with the use of anesthetics.  Any blood disorders you have.  Previous surgeries you have had.  Medical conditions you have. RISKS AND COMPLICATIONS Generally, ureteral stent implantation is a safe procedure. However, as with any procedure, complications can occur. Possible complications include:  Movement of the stent away from where it was originally placed (migration). This may affect the ability of the stent to properly drain your kidney. If migration of the stent occurs, the stent may need to be replaced or repositioned.  Perforation of the ureter.  Infection. BEFORE THE PROCEDURE  You may be asked to wash your genital area with sterile soap the morning of your procedure.  You may be given an oral antibiotic which you should take with a sip of water as prescribed by your health care provider.  You may be asked to not eat or drink for 8 hours before the surgery. PROCEDURE  First you will be given an anesthetic so you do not feel pain  during the procedure.  Your health care provider will insert a special lighted instrument called a cystoscope into your bladder. This allows your health care provider to see the opening to your ureter.  A thin wire is carefully threaded into your bladder and up the ureter. The stent is inserted over the wire and the wire is then removed.  Your bladder will be emptied of urine. AFTER THE PROCEDURE You will be taken to a recovery room until it is okay for you to go home.   This information is not intended to replace advice given to you by your health care provider. Make sure you discuss any questions you have with your health care provider.   Document Released: 12/26/1999 Document Revised: 01/02/2013 Document Reviewed: 07/12/2014 Elsevier Interactive Patient Education [employer].

## 2015-01-28 DIAGNOSIS — N179 Acute kidney failure, unspecified: Secondary | ICD-10-CM

## 2015-01-28 DIAGNOSIS — N390 Urinary tract infection, site not specified: Secondary | ICD-10-CM

## 2015-01-28 DIAGNOSIS — I10 Essential (primary) hypertension: Secondary | ICD-10-CM

## 2015-01-28 DIAGNOSIS — N132 Hydronephrosis with renal and ureteral calculous obstruction: Secondary | ICD-10-CM

## 2015-01-28 DIAGNOSIS — N136 Pyonephrosis: Secondary | ICD-10-CM

## 2015-01-28 DIAGNOSIS — N201 Calculus of ureter: Secondary | ICD-10-CM

## 2015-01-28 LAB — CBC
HCT: 32.9 % — ABNORMAL LOW (ref 39.0–52.0)
HEMOGLOBIN: 11.3 g/dL — AB (ref 13.0–17.0)
MCH: 29 pg (ref 26.0–34.0)
MCHC: 34.3 g/dL (ref 30.0–36.0)
MCV: 84.6 fL (ref 78.0–100.0)
PLATELETS: 217 10*3/uL (ref 150–400)
RBC: 3.89 MIL/uL — ABNORMAL LOW (ref 4.22–5.81)
RDW: 15.2 % (ref 11.5–15.5)
WBC: 18.6 10*3/uL — AB (ref 4.0–10.5)

## 2015-01-28 LAB — COMPREHENSIVE METABOLIC PANEL
ALBUMIN: 2.7 g/dL — AB (ref 3.5–5.0)
ALK PHOS: 75 U/L (ref 38–126)
ALT: 18 U/L (ref 17–63)
ANION GAP: 7 (ref 5–15)
AST: 18 U/L (ref 15–41)
BILIRUBIN TOTAL: 1.2 mg/dL (ref 0.3–1.2)
BUN: 24 mg/dL — AB (ref 6–20)
CALCIUM: 8.1 mg/dL — AB (ref 8.9–10.3)
CO2: 25 mmol/L (ref 22–32)
Chloride: 106 mmol/L (ref 101–111)
Creatinine, Ser: 1.93 mg/dL — ABNORMAL HIGH (ref 0.61–1.24)
GFR calc Af Amer: 43 mL/min — ABNORMAL LOW (ref >60)
GFR, EST NON AFRICAN AMERICAN: 37 mL/min — AB (ref >60)
GLUCOSE: 134 mg/dL — AB (ref 65–99)
Potassium: 3.5 mmol/L (ref 3.5–5.1)
Sodium: 138 mmol/L (ref 135–145)
TOTAL PROTEIN: 6.3 g/dL — AB (ref 6.5–8.1)

## 2015-01-28 LAB — GLUCOSE, CAPILLARY
GLUCOSE-CAPILLARY: 134 mg/dL — AB (ref 65–99)
GLUCOSE-CAPILLARY: 108 mg/dL — AB (ref 65–99)
GLUCOSE-CAPILLARY: 175 mg/dL — AB (ref 65–99)
Glucose-Capillary: 113 mg/dL — ABNORMAL HIGH (ref 65–99)
Glucose-Capillary: 157 mg/dL — ABNORMAL HIGH (ref 65–99)
Glucose-Capillary: 109 mg/dL — ABNORMAL HIGH (ref 65–99)

## 2015-01-28 LAB — LACTIC ACID, PLASMA: LACTIC ACID, VENOUS: 0.9 mmol/L (ref 0.5–2.0)

## 2015-01-28 MED ORDER — ACETAMINOPHEN 325 MG PO TABS
650.0000 mg | ORAL_TABLET | Freq: Four times a day (QID) | ORAL | Status: DC | PRN
  Administered 2015-01-29: 650 mg via ORAL
  Filled 2015-01-28: qty 2

## 2015-01-28 MED ORDER — ENSURE ENLIVE PO LIQD
237.0000 mL | Freq: Two times a day (BID) | ORAL | Status: DC
  Administered 2015-01-28 – 2015-01-30 (×4): 237 mL via ORAL

## 2015-01-28 NOTE — Anesthesia Postprocedure Evaluation (Signed)
Anesthesia Post Note  Patient: Rino Twilley  Procedure(s) Performed: Procedure(s) (LRB): CYSTOSCOPY, RETROGRADE, WITH LEFT STENT PLACEMENT (Left)  Patient location during evaluation: Nursing Unit Anesthesia Type: General Level of consciousness: awake and alert and oriented Pain management: pain level controlled Vital Signs Assessment: post-procedure vital signs reviewed and stable Respiratory status: spontaneous breathing and respiratory function stable Cardiovascular status: stable Postop Assessment: no signs of nausea or vomiting Anesthetic complications: no    Last Vitals:  Filed Vitals:   01/27/15 2230 01/28/15 0418  BP: 116/69 107/56  Pulse: 69 96  Temp: 37.3 C 37.5 C  Resp: 20 19    Last Pain:  Filed Vitals:   01/28/15 0420  PainSc: 0-No pain                 ADAMS, AMY A

## 2015-01-28 NOTE — Progress Notes (Signed)
PROGRESS NOTE  Cameron Noble [id number] DOB: Nov 24, 1960 DOA: 01/27/2015 PCP: Chevis Pretty, FNP  HPI/Recap of past 46 hours: 55 year old male past mental history of diverticulosis, diabetes and hypertension admitted on 1/16 for UTI, acute kidney injury or from obstructing stone causing hydronephrosis. Patient seen by urology and that night, underwent a cystoscopy with left retrograde pyelogram and insertion of left double-J stent.   This morning, patient doing well. No complaints. Tolerated procedure well  Assessment/Plan: Principal Problem:  Left sided Hydronephrosis with urinary obstruction due to ureteral calculus: Status post double-J stent placed. Continue antibiotics and they will see patient in outpatient setting Active Problems:   Essential hypertension, benign: Blood pressure stable.    Hyperlipidemia with target LDL less than 100   Diabetes ([place]): CBG stable, under 160.    UTI (lower urinary tract infection): Much improved. White blood cell count trending downward. Awaiting urine cultures.  Acute kidney injury: Creatinine at 2.4 on admission and down to 1.9 today. Patient has normal renal function with normal creatinine at baseline. Continue IV fluids  Code Status: Full code   Family Communication: Left message for wife   Disposition Plan: Anticipate discharge tomorrow as long as white count near normal and creatinine normalized    Consultants:  Urology   Procedures:  1/16 Cystoscopy with Placement of double-J stent on left ureter  Antibiotics:  IV Cipro 1/161 dose  IV Rocephin 1/16-present    Objective: BP 107/56 mmHg  Pulse 96  Temp(Src) 99.5 F (37.5 C) (Oral)  Resp 19  Ht 5\' 7"  (1.702 m)  Wt 88.315 kg (194 lb 11.2 oz)  BMI 30.49 kg/m2  SpO2 99%  Intake/Output Summary (Last 24 hours) at 01/28/15 1345 Last data filed at 01/28/15 1100  Gross per 24 hour  Intake 1858.75 ml  Output   1300 ml  Net 558.75 ml   Filed Weights   01/27/15  1056 01/28/15 0419  Weight: 87.091 kg (192 lb) 88.315 kg (194 lb 11.2 oz)    Exam:   General:  Alert and oriented 3   Cardiovascular: Regular rate and rhythm, S1-S2   Respiratory: Clear to auscultation bilaterally   Abdomen: Soft, nontender, nondistended, positive bowel sounds   Musculoskeletal: No clubbing cyanosis or edema    Data Reviewed: Basic Metabolic Panel:  Recent Labs Lab 01/27/15 1112 01/28/15 0525  NA 138 138  K 3.9 3.5  CL 101 106  CO2 27 25  GLUCOSE 247* 134*  BUN 33* 24*  CREATININE 2.39* 1.93*  CALCIUM 9.4 8.1*   Liver Function Tests:  Recent Labs Lab 01/27/15 1112 01/28/15 0525  AST 26 18  ALT 21 18  ALKPHOS 89 75  BILITOT 1.2 1.2  PROT 7.6 6.3*  ALBUMIN 3.4* 2.7*    Recent Labs Lab 01/27/15 1112  LIPASE 16   No results for input(s): AMMONIA in the last 168 hours. CBC:  Recent Labs Lab 01/27/15 1112 01/28/15 0525  WBC 23.0* 18.6*  HGB 12.0* 11.3*  HCT 36.2* 32.9*  MCV 85.2 84.6  PLT 206 217   Cardiac Enzymes:   No results for input(s): CKTOTAL, CKMB, CKMBINDEX, TROPONINI in the last 168 hours. BNP (last 3 results) No results for input(s): BNP in the last 8760 hours.  ProBNP (last 3 results) No results for input(s): PROBNP in the last 8760 hours.  CBG:  Recent Labs Lab 01/27/15 2047 01/27/15 2204 01/28/15 0415 01/28/15 0722 01/28/15 1113  GLUCAP 113* 101* 134* 108* 157*    Recent Results (from the past  240 hour(s))  Urine culture     Status: None (Preliminary result)   Collection Time: 01/27/15  2:00 PM  Result Value Ref Range Status   Specimen Description URINE, RANDOM  Final   Special Requests NONE  Final   Culture   Final    TOO YOUNG TO READ Performed at [facility]    Report Status PENDING  Incomplete  Culture, blood (routine x 2)     Status: None (Preliminary result)   Collection Time: 01/27/15  6:05 PM  Result Value Ref Range Status   Specimen Description BLOOD LEFT ARM  Final   Special  Requests BOTTLES DRAWN AEROBIC AND ANAEROBIC 8CC  Final   Culture PENDING  Incomplete   Report Status PENDING  Incomplete  Culture, blood (routine x 2)     Status: None (Preliminary result)   Collection Time: 01/27/15  6:10 PM  Result Value Ref Range Status   Specimen Description BLOOD LEFT HAND  Final   Special Requests BOTTLES DRAWN AEROBIC AND ANAEROBIC 8CC  Final   Culture PENDING  Incomplete   Report Status PENDING  Incomplete     Studies: Ct Abdomen Pelvis Wo Contrast  01/27/2015  CLINICAL DATA:  Left lower quadrant pain for 2 weeks. Elevated creatinine. Diarrhea. EXAM: CT ABDOMEN AND PELVIS WITHOUT CONTRAST TECHNIQUE: Multidetector CT imaging of the abdomen and pelvis was performed following the standard protocol without IV contrast. COMPARISON:  None. FINDINGS: Lower chest: No significant pulmonary nodules or acute consolidative airspace disease. Hepatobiliary: Normal liver with no liver mass. Normal gallbladder with no radiopaque cholelithiasis. No biliary ductal dilatation. Pancreas: Normal, with no mass or duct dilation. Spleen: Normal size. No mass. Adrenals/Urinary Tract: Normal adrenals. There is an obstructing 10 x 8 x 13 mm stone at the left ureteropelvic junction with mild left hydronephrosis, asymmetric enlargement of the left kidney and left perinephric fat stranding. No right hydronephrosis. No additional urolithiasis. No contour deforming renal masses. Stomach/Bowel: Grossly normal stomach. Normal caliber small bowel with no small bowel wall thickening. Normal appendix. Normal large bowel with no diverticulosis, large bowel wall thickening or pericolonic fat stranding. Vascular/Lymphatic: Normal caliber abdominal aorta. No pathologically enlarged lymph nodes in the abdomen or pelvis. Reproductive: Mild prostatomegaly. Other: No pneumoperitoneum, ascites or focal fluid collection. Musculoskeletal: No aggressive appearing focal osseous lesions. Severe degenerative disc disease in the  lower lumbar spine. Small fat containing umbilical hernia. IMPRESSION: 1. Obstructing 10 x 8 x 13 mm stone at the left UPJ with mild left hydronephrosis and left renal and perinephric edema. 2. Mild prostatomegaly. Electronically Signed   By: Ilona Sorrel M.D.   On: 01/27/2015 16:44   Dg Retrograde Pyelogram  01/28/2015  CLINICAL DATA:  Ureteral calculus. EXAM: RETROGRADE PYELOGRAM COMPARISON:  CT 01/27/2015. FINDINGS: Double-J left ureteral stent is noted. Its proximal tip is projected over the left kidney. Lower portion the stent not imaged. Radiation dose 7.7 mGy. IMPRESSION: Double-J left ureteral stent noted as above . Electronically Signed   By: Marcello Moores  Register   On: 01/28/2015 07:57    Scheduled Meds: . aspirin EC  81 mg Oral Daily  . atorvastatin  10 mg Oral Daily  . cefTRIAXone (ROCEPHIN)  IV  1 g Intravenous Q24H  . feeding supplement (ENSURE ENLIVE)  237 mL Oral BID BM  . heparin  5,000 Units Subcutaneous 3 times per day  . insulin aspart  0-9 Units Subcutaneous 6 times per day  . omega-3 acid ethyl esters  2 g Oral Daily  .  sodium chloride  1,000 mL Intravenous Once    Continuous Infusions: . sodium chloride 1,000 mL (01/28/15 0419)     Time spent: 15 minutes   [facility] Pager [phone] . If 7PM-7AM, please contact night-coverage at [email or web address], password [facility] 01/28/2015, 1:45 PM  LOS: 1 day

## 2015-01-28 NOTE — Progress Notes (Signed)
Temp reported to Dr. Maryland Pink.

## 2015-01-28 NOTE — Op Note (Signed)
Cameron Noble, Cameron Noble NO.:  [id number]  MEDICAL RECORD NO.:  [id number]  LOCATION:  A305                          FACILITY:  [facility]  PHYSICIAN:  Marshall Cork. Jeffie Pollock, M.D.    DATE OF BIRTH:  Jan 05, 1961  DATE OF PROCEDURE:  01/27/2015 DATE OF DISCHARGE:                              OPERATIVE REPORT   PROCEDURE:  Cystoscopy with left retrograde pyelogram and insertion of left double-J stent.  PREOPERATIVE DIAGNOSIS:  Left UPJ stone with sepsis.  POSTOPERATIVE DIAGNOSIS:  Left UPJ stone with sepsis with pyelonephrosis.  SURGEON:  Marshall Cork. Jeffie Pollock, M.D.  ANESTHESIA:  General.  SPECIMEN:  None.  DRAINS:  A 6-French x 26 cm left double-J stent.  BLOOD LOSS:  None.  COMPLICATIONS:  None.  INDICATIONS:  Mr. Penaflor is a 54 year old, African American male, who presented with a 2-week history of left flank pain, followed by the onset of fever and chills over the weekend.  He was seen emergency room, was found to have pyuria, white count of 23,000 with renal insufficiency and a 13-mm obstructing left UPJ stone.  It was felt that stenting was indicated for relief of obstruction in the phase of infection.  FINDINGS AND PROCEDURE:  He was taken to the operating room, where general anesthetic was induced.  He had received broad-spectrum antibiotic coverage prior to the procedure.  He was placed in the lithotomy position and fitted with PAS hose.  His perineum and genitalia were prepped with Betadine solution.  He was draped in usual sterile fashion.  Cystoscopy was performed using 23-French scope and 30-degree lens. Examination revealed a normal urethra.  The external sphincter was intact.  The prostatic urethra assured bilobar hyperplasia with minimal obstruction.  Examination of the bladder revealed mild trabeculation. The bladder mucosa was smooth and pale, but there were some findings suggestive of follicular cystitis particularly in the trigone and ureteral orifices  were unremarkable.  Left ureteral orifice was cannulated with 5-French open-end catheter and contrast was gently instilled, this did efflux easily back up to the large stone and some contrast did go around the stone into the collecting system although it was only a scant amount.  After completion of retrograde pyelogram, a guidewire was passed through the open-end catheter and was negotiated by the stone into the kidney. Once the wire was by the stone into the kidney, the open-end catheter was removed.  There was brisk efflux of thick purulent material alongside the wire from the ureteral meatus.  At this point, a 6-French 26-cm double-J stent was inserted over the wire.  There was some resistance, but it did make it to the kidney.  The wire was removed leaving good coil in the kidney and good coil in the bladder.  Final inspection revealed efflux of cloudy urine from the stent ports.  At this point, the bladder was drained.  The cystoscope was removed. The patient was taken down from lithotomy position.  His anesthetic was reversed.  He was moved to recovery room in stable condition.  There were no complications.     Marshall Cork. Jeffie Pollock, M.D.     JJW/MEDQ  D:  01/27/2015  T:  01/27/2015  Job:  [id number]

## 2015-01-28 NOTE — Progress Notes (Signed)
1 Day Post-Op Subjective: Patient reports feeling better. Minimal stent discomfort  Objective: Vital signs in last 24 hours: Temp:  [99.2 F (37.3 C)-102.4 F (39.1 C)] 99.5 F (37.5 C) (01/17 0418) Pulse Rate:  [69-102] 96 (01/17 0418) Resp:  [13-24] 19 (01/17 0418) BP: (107-134)/(50-78) 107/56 mmHg (01/17 0418) SpO2:  [95 %-100 %] 99 % (01/17 0418) Weight:  [88.315 kg (194 lb 11.2 oz)] 88.315 kg (194 lb 11.2 oz) (01/17 0419)  Intake/Output from previous day: 01/16 0701 - 01/17 0700 In: 1618.8 [I.V.:1618.8] Out: 550 [Urine:550] Intake/Output this shift: Total I/O In: 240 [P.O.:240] Out: 750 [Urine:750]  Physical Exam:  Constitutional: Vital signs reviewed. WD WN in NAD   Eyes: PERRL, No scleral icterus.   Cardiovascular: RRR Pulmonary/Chest: Normal effort Abdominal: Soft. Non-tender, non-distended, bowel sounds are normal, no masses, organomegaly, or guarding present.  Genitourinary: Extremities: No cyanosis or edema   Lab Results:  Recent Labs  01/27/15 1112 01/28/15 0525  HGB 12.0* 11.3*  HCT 36.2* 32.9*   BMET  Recent Labs  01/27/15 1112 01/28/15 0525  NA 138 138  K 3.9 3.5  CL 101 106  CO2 27 25  GLUCOSE 247* 134*  BUN 33* 24*  CREATININE 2.39* 1.93*  CALCIUM 9.4 8.1*   No results for input(s): LABPT, INR in the last 72 hours. No results for input(s): LABURIN in the last 72 hours. Results for orders placed or performed during the hospital encounter of 01/27/15  Urine culture     Status: None (Preliminary result)   Collection Time: 01/27/15  2:00 PM  Result Value Ref Range Status   Specimen Description URINE, RANDOM  Final   Special Requests NONE  Final   Culture   Final    TOO YOUNG TO READ Performed at [facility]    Report Status PENDING  Incomplete  Culture, blood (routine x 2)     Status: None (Preliminary result)   Collection Time: 01/27/15  6:05 PM  Result Value Ref Range Status   Specimen Description BLOOD LEFT ARM  Final    Special Requests BOTTLES DRAWN AEROBIC AND ANAEROBIC 8CC  Final   Culture PENDING  Incomplete   Report Status PENDING  Incomplete  Culture, blood (routine x 2)     Status: None (Preliminary result)   Collection Time: 01/27/15  6:10 PM  Result Value Ref Range Status   Specimen Description BLOOD LEFT HAND  Final   Special Requests BOTTLES DRAWN AEROBIC AND ANAEROBIC 8CC  Final   Culture PENDING  Incomplete   Report Status PENDING  Incomplete    Studies/Results: Ct Abdomen Pelvis Wo Contrast  01/27/2015  CLINICAL DATA:  Left lower quadrant pain for 2 weeks. Elevated creatinine. Diarrhea. EXAM: CT ABDOMEN AND PELVIS WITHOUT CONTRAST TECHNIQUE: Multidetector CT imaging of the abdomen and pelvis was performed following the standard protocol without IV contrast. COMPARISON:  None. FINDINGS: Lower chest: No significant pulmonary nodules or acute consolidative airspace disease. Hepatobiliary: Normal liver with no liver mass. Normal gallbladder with no radiopaque cholelithiasis. No biliary ductal dilatation. Pancreas: Normal, with no mass or duct dilation. Spleen: Normal size. No mass. Adrenals/Urinary Tract: Normal adrenals. There is an obstructing 10 x 8 x 13 mm stone at the left ureteropelvic junction with mild left hydronephrosis, asymmetric enlargement of the left kidney and left perinephric fat stranding. No right hydronephrosis. No additional urolithiasis. No contour deforming renal masses. Stomach/Bowel: Grossly normal stomach. Normal caliber small bowel with no small bowel wall thickening. Normal appendix. Normal large  bowel with no diverticulosis, large bowel wall thickening or pericolonic fat stranding. Vascular/Lymphatic: Normal caliber abdominal aorta. No pathologically enlarged lymph nodes in the abdomen or pelvis. Reproductive: Mild prostatomegaly. Other: No pneumoperitoneum, ascites or focal fluid collection. Musculoskeletal: No aggressive appearing focal osseous lesions. Severe degenerative  disc disease in the lower lumbar spine. Small fat containing umbilical hernia. IMPRESSION: 1. Obstructing 10 x 8 x 13 mm stone at the left UPJ with mild left hydronephrosis and left renal and perinephric edema. 2. Mild prostatomegaly. Electronically Signed   By: Ilona Sorrel M.D.   On: 01/27/2015 16:44   Dg Retrograde Pyelogram  01/28/2015  CLINICAL DATA:  Ureteral calculus. EXAM: RETROGRADE PYELOGRAM COMPARISON:  CT 01/27/2015. FINDINGS: Double-J left ureteral stent is noted. Its proximal tip is projected over the left kidney. Lower portion the stent not imaged. Radiation dose 7.7 mGy. IMPRESSION: Double-J left ureteral stent noted as above . Electronically Signed   By: Marcello Moores  Register   On: 01/28/2015 07:57    Assessment/Plan:   POD 1 Lt J2 stent placement for pyonephrosis. Doing very well. I'm fine w/ d/c tomorrow on appropriate abx unless clinical course worse.   Will need f/u in our [place] office to discuss stone mgmt   LOS: 1 day   Jorja Loa 01/28/2015, 12:39 PM

## 2015-01-28 NOTE — Addendum Note (Signed)
Addendum  created 01/28/15 1010 by Mickel Baas, CRNA   Modules edited: Clinical Notes   Clinical Notes:  File: [id number]

## 2015-01-29 ENCOUNTER — Encounter (HOSPITAL_COMMUNITY): Payer: Self-pay | Admitting: Urology

## 2015-01-29 DIAGNOSIS — E0811 Diabetes mellitus due to underlying condition with ketoacidosis with coma: Secondary | ICD-10-CM

## 2015-01-29 LAB — CBC
HCT: 31.7 % — ABNORMAL LOW (ref 39.0–52.0)
Hemoglobin: 11 g/dL — ABNORMAL LOW (ref 13.0–17.0)
MCH: 29 pg (ref 26.0–34.0)
MCHC: 34.7 g/dL (ref 30.0–36.0)
MCV: 83.6 fL (ref 78.0–100.0)
PLATELETS: 222 10*3/uL (ref 150–400)
RBC: 3.79 MIL/uL — ABNORMAL LOW (ref 4.22–5.81)
RDW: 15.5 % (ref 11.5–15.5)
WBC: 14.1 10*3/uL — ABNORMAL HIGH (ref 4.0–10.5)

## 2015-01-29 LAB — HEMOGLOBIN A1C
HEMOGLOBIN A1C: 6.3 % — AB (ref 4.8–5.6)
Mean Plasma Glucose: 134 mg/dL

## 2015-01-29 LAB — BASIC METABOLIC PANEL
ANION GAP: 6 (ref 5–15)
BUN: 22 mg/dL — ABNORMAL HIGH (ref 6–20)
CALCIUM: 8 mg/dL — AB (ref 8.9–10.3)
CO2: 25 mmol/L (ref 22–32)
CREATININE: 1.69 mg/dL — AB (ref 0.61–1.24)
Chloride: 104 mmol/L (ref 101–111)
GFR calc Af Amer: 51 mL/min — ABNORMAL LOW (ref >60)
GFR, EST NON AFRICAN AMERICAN: 44 mL/min — AB (ref >60)
GLUCOSE: 126 mg/dL — AB (ref 65–99)
POTASSIUM: 3.4 mmol/L — AB (ref 3.5–5.1)
SODIUM: 135 mmol/L (ref 135–145)

## 2015-01-29 LAB — GLUCOSE, CAPILLARY
GLUCOSE-CAPILLARY: 113 mg/dL — AB (ref 65–99)
GLUCOSE-CAPILLARY: 137 mg/dL — AB (ref 65–99)
GLUCOSE-CAPILLARY: 142 mg/dL — AB (ref 65–99)
GLUCOSE-CAPILLARY: 150 mg/dL — AB (ref 65–99)
Glucose-Capillary: 129 mg/dL — ABNORMAL HIGH (ref 65–99)
Glucose-Capillary: 166 mg/dL — ABNORMAL HIGH (ref 65–99)

## 2015-01-29 LAB — URINE CULTURE: Culture: >=100000

## 2015-01-29 MED ORDER — GLIPIZIDE ER 5 MG PO TB24
5.0000 mg | ORAL_TABLET | Freq: Every day | ORAL | Status: DC
  Administered 2015-01-29 – 2015-01-30 (×2): 5 mg via ORAL
  Filled 2015-01-29 (×2): qty 1

## 2015-01-29 NOTE — Care Management Note (Signed)
Case Management Note  Patient Details  Name: Cameron Noble MRN: [id number] Date of Birth: 04-13-1960  Subjective/Objective:                  Pt is from home, lives with wife and is ind with ADL's. Pt plans to return home with self care at DC.   Action/Plan: No CM needs anticipated.   Expected Discharge Date:       01/29/2015           Expected Discharge Plan:  Home/Self Care  In-House Referral:  NA  Discharge planning Services  CM Consult  Post Acute Care Choice:  NA Choice offered to:  NA  DME Arranged:    DME Agency:     HH Arranged:    HH Agency:     Status of Service:  Completed, signed off  Medicare Important Message Given:    Date Medicare IM Given:    Medicare IM give by:    Date Additional Medicare IM Given:    Additional Medicare Important Message give by:     If discussed at [place] of Stay Meetings, dates discussed:    Additional Comments:  Sherald Barge, RN 01/29/2015, 1:29 PM

## 2015-01-29 NOTE — Clinical Documentation Improvement (Signed)
Internal Medicine  Conflicting documentation found in notes regarding Sepsis. OP Note on 01/27/15 by Dr. Keene Breath states Sepsis. No further notation is seen in chart. Please clarify if you feel:  Please document findings in next progress note; NOT in BPA drop down box. Thanks!   Sepsis ruled in and was present or evolving on admission (POA)  Sepsis ruled out  Other  Clinically Undetermined  Supporting Information:  WBC on admission was 23 -> 18.6  Temps running as high as 102.6 orally  Heart rate > 90  IV Rocephin q24h initiated  Please exercise your independent, professional judgment when responding. A specific answer is not anticipated or expected.  Thank You, Zoila Shutter RN, BSN, [place] [phone]; Cell: [phone]

## 2015-01-29 NOTE — Progress Notes (Signed)
[facility] PROGRESS NOTE  Garvice Gurnee [id number] DOB: Jul 27, 1960    PCP:   Chevis Pretty, FNP   HPI: Cameron Noble is an 55 y.o. male admitted for stone uropathy and pyelonephritis, with AKI.   He had left ureteral stent placed by Dr Jeffie Pollock, and his AKI has improved.  He was started on IV Rocephin, and is doing well.  No further fever.   The urine grew Klebsiella resistant to ampicillin, sensitive to the rest including Rocephin.    Rewiew of Systems:  Constitutional: Negative for malaise, fever and chills. No significant weight loss or weight gain Eyes: Negative for eye pain, redness and discharge, diplopia, visual changes, or flashes of light. ENMT: Negative for ear pain, hoarseness, nasal congestion, sinus pressure and sore throat. No headaches; tinnitus, drooling, or problem swallowing. Cardiovascular: Negative for chest pain, palpitations, diaphoresis, dyspnea and peripheral edema. ; No orthopnea, PND Respiratory: Negative for cough, hemoptysis, wheezing and stridor. No pleuritic chestpain. Gastrointestinal: Negative for nausea, vomiting, diarrhea, constipation, abdominal pain, melena, blood in stool, hematemesis, jaundice and rectal bleeding.    Genitourinary: Negative for frequency, dysuria, incontinence,flank pain and hematuria; Musculoskeletal: Negative for back pain and neck pain. Negative for swelling and trauma.;  Skin: . Negative for pruritus, rash, abrasions, bruising and skin lesion.; ulcerations Neuro: Negative for headache, lightheadedness and neck stiffness. Negative for weakness, altered level of consciousness , altered mental status, extremity weakness, burning feet, involuntary movement, seizure and syncope.  Psych: negative for anxiety, depression, insomnia, tearfulness, panic attacks, hallucinations, paranoia, suicidal or homicidal ideation    Past Medical History  Diagnosis Date  . Allergic rhinitis   . DDD (degenerative disc disease), cervical   .  Diverticulosis   . Diabetes mellitus   . Hypertension   . Hyperlipidemia   . URI (upper respiratory infection)     Past Surgical History  Procedure Laterality Date  . Back surgery  03/2002    Dr. Trenton Gammon   . Ddd c-spine repair    . Cystoscopy with stent placement Left 01/27/2015    Procedure: CYSTOSCOPY, RETROGRADE, WITH LEFT STENT PLACEMENT;  Surgeon: Irine Seal, MD;  Location: [facility];  Service: Urology;  Laterality: Left;  I have 2 cases at [facility] and a foley to place at [facility]  I would guess it will be 830-9 before I get up to [facility].     Medications:  HOME MEDS: Prior to Admission medications   Medication Sig Start Date End Date Taking? Authorizing Provider  aspirin 81 MG tablet Take 81 mg by mouth daily.   Yes Historical Provider, MD  atorvastatin (LIPITOR) 10 MG tablet Take 1 tablet (10 mg total) by mouth daily. 08/12/14  Yes [name], FNP  fish oil-omega-3 fatty acids 1000 MG capsule Take 2 g by mouth daily.   Yes Historical Provider, MD  fluticasone (FLONASE) 50 MCG/ACT nasal spray Place 2 sprays into both nostrils daily. 04/23/13  Yes [name], FNP  lisinopril (PRINIVIL,ZESTRIL) 10 MG tablet Take 1 tablet (10 mg total) by mouth daily. 08/12/14  Yes [name], FNP  metFORMIN (GLUCOPHAGE) 1000 MG tablet Take 1 tablet (1,000 mg total) by mouth 2 (two) times daily with a meal. 08/12/14  Yes [name], FNP  VITAMIN D, ERGOCALCIFEROL, PO Take by mouth.   Yes Historical Provider, MD     Allergies:  No Known Allergies  Social History:   reports that he has never smoked. He does not have any smokeless tobacco history on file. He reports that he drinks  alcohol. He reports that he does not use illicit drugs.  Family History: History reviewed. No pertinent family history.   Physical Exam: Filed Vitals:   01/28/15 1511 01/28/15 2029 01/28/15 2135 01/29/15 0545  BP: 117/64  110/62 118/55  Pulse: 92  87 64  Temp: 102.6 F (39.2 C)  99.2 F (37.3 C)  98.1 F (36.7 C)  TempSrc: Oral  Oral Oral  Resp: 20  20 20   Height:      Weight:      SpO2: 99% 94% 95% 96%   Blood pressure 118/55, pulse 64, temperature 98.1 F (36.7 C), temperature source Oral, resp. rate 20, height 5\' 7"  (1.702 m), weight 88.315 kg (194 lb 11.2 oz), SpO2 96 %.  GEN:  Pleasant  patient lying in the stretcher in no acute distress; cooperative with exam. PSYCH:  alert and oriented x4; does not appear anxious or depressed; affect is appropriate. HEENT: Mucous membranes pink and anicteric; PERRLA; EOM intact; no cervical lymphadenopathy nor thyromegaly or carotid bruit; no JVD; There were no stridor. Neck is very supple. Breasts:: Not examined CHEST WALL: No tenderness CHEST: Normal respiration, clear to auscultation bilaterally.  HEART: Regular rate and rhythm.  There are no murmur, rub, or gallops.   BACK: No kyphosis or scoliosis; no CVA tenderness ABDOMEN: soft and non-tender; no masses, no organomegaly, normal abdominal bowel sounds; no pannus; no intertriginous candida. There is no rebound and no distention. Rectal Exam: Not done EXTREMITIES: No bone or joint deformity; age-appropriate arthropathy of the hands and knees; no edema; no ulcerations.  There is no calf tenderness. Genitalia: not examined PULSES: 2+ and symmetric SKIN: Normal hydration no rash or ulceration CNS: Cranial nerves 2-12 grossly intact no focal lateralizing neurologic deficit.  Speech is fluent; uvula elevated with phonation, facial symmetry and tongue midline. DTR are normal bilaterally, cerebella exam is intact, barbinski is negative and strengths are equaled bilaterally.  No sensory loss.   Labs on Admission:  Basic Metabolic Panel:  Recent Labs Lab 01/27/15 1112 01/28/15 0525 01/29/15 0623  NA 138 138 135  K 3.9 3.5 3.4*  CL 101 106 104  CO2 27 25 25   GLUCOSE 247* 134* 126*  BUN 33* 24* 22*  CREATININE 2.39* 1.93* 1.69*  CALCIUM 9.4 8.1* 8.0*   Liver Function  Tests:  Recent Labs Lab 01/27/15 1112 01/28/15 0525  AST 26 18  ALT 21 18  ALKPHOS 89 75  BILITOT 1.2 1.2  PROT 7.6 6.3*  ALBUMIN 3.4* 2.7*    Recent Labs Lab 01/27/15 1112  LIPASE 16   CBC:  Recent Labs Lab 01/27/15 1112 01/28/15 0525 01/29/15 0623  WBC 23.0* 18.6* 14.1*  HGB 12.0* 11.3* 11.0*  HCT 36.2* 32.9* 31.7*  MCV 85.2 84.6 83.6  PLT 206 217 222   CBG:  Recent Labs Lab 01/28/15 1632 01/28/15 2027 01/29/15 0021 01/29/15 0545 01/29/15 1113  GLUCAP 109* 175* 113* 129* 166*     Radiological Exams on Admission: Ct Abdomen Pelvis Wo Contrast  01/27/2015  CLINICAL DATA:  Left lower quadrant pain for 2 weeks. Elevated creatinine. Diarrhea. EXAM: CT ABDOMEN AND PELVIS WITHOUT CONTRAST TECHNIQUE: Multidetector CT imaging of the abdomen and pelvis was performed following the standard protocol without IV contrast. COMPARISON:  None. FINDINGS: Lower chest: No significant pulmonary nodules or acute consolidative airspace disease. Hepatobiliary: Normal liver with no liver mass. Normal gallbladder with no radiopaque cholelithiasis. No biliary ductal dilatation. Pancreas: Normal, with no mass or duct dilation. Spleen: Normal size. No  mass. Adrenals/Urinary Tract: Normal adrenals. There is an obstructing 10 x 8 x 13 mm stone at the left ureteropelvic junction with mild left hydronephrosis, asymmetric enlargement of the left kidney and left perinephric fat stranding. No right hydronephrosis. No additional urolithiasis. No contour deforming renal masses. Stomach/Bowel: Grossly normal stomach. Normal caliber small bowel with no small bowel wall thickening. Normal appendix. Normal large bowel with no diverticulosis, large bowel wall thickening or pericolonic fat stranding. Vascular/Lymphatic: Normal caliber abdominal aorta. No pathologically enlarged lymph nodes in the abdomen or pelvis. Reproductive: Mild prostatomegaly. Other: No pneumoperitoneum, ascites or focal fluid collection.  Musculoskeletal: No aggressive appearing focal osseous lesions. Severe degenerative disc disease in the lower lumbar spine. Small fat containing umbilical hernia. IMPRESSION: 1. Obstructing 10 x 8 x 13 mm stone at the left UPJ with mild left hydronephrosis and left renal and perinephric edema. 2. Mild prostatomegaly. Electronically Signed   By: Ilona Sorrel M.D.   On: 01/27/2015 16:44   Dg Retrograde Pyelogram  01/28/2015  CLINICAL DATA:  Ureteral calculus. EXAM: RETROGRADE PYELOGRAM COMPARISON:  CT 01/27/2015. FINDINGS: Double-J left ureteral stent is noted. Its proximal tip is projected over the left kidney. Lower portion the stent not imaged. Radiation dose 7.7 mGy. IMPRESSION: Double-J left ureteral stent noted as above . Electronically Signed   By: Marcello Moores  Register   On: 01/28/2015 07:57   Assessment/Plan Present on Admission:  . Hydronephrosis with urinary obstruction due to ureteral calculus . Hyperlipidemia with target LDL less than 100 . Essential hypertension, benign . UTI (lower urinary tract infection) . AKI (acute kidney injury) ([place])  PLAN:  AKI: resolving.  Continue IVF today.  Plan d/c tomorrow.  Pyelonephritis:  Will continue another day of IV Rocephin, and plan d/c on oral Cipro tomorrow.   HTN:  Doing well.  Hold ACE I   DM:  Continue with SSI.  Will change home med of Metformin to Glucotrol today.   Other plans as per orders. Code Status: FULL CODE>   Orvan Falconer, MD.  FACP [facility] Pager [phone] 7pm to 7am.  01/29/2015, 11:39 AM

## 2015-01-30 LAB — BASIC METABOLIC PANEL
Anion gap: 5 (ref 5–15)
BUN: 20 mg/dL (ref 6–20)
CHLORIDE: 108 mmol/L (ref 101–111)
CO2: 26 mmol/L (ref 22–32)
CREATININE: 1.53 mg/dL — AB (ref 0.61–1.24)
Calcium: 8.4 mg/dL — ABNORMAL LOW (ref 8.9–10.3)
GFR calc Af Amer: 57 mL/min — ABNORMAL LOW (ref >60)
GFR calc non Af Amer: 49 mL/min — ABNORMAL LOW (ref >60)
GLUCOSE: 92 mg/dL (ref 65–99)
POTASSIUM: 3.5 mmol/L (ref 3.5–5.1)
Sodium: 139 mmol/L (ref 135–145)

## 2015-01-30 LAB — GLUCOSE, CAPILLARY
GLUCOSE-CAPILLARY: 106 mg/dL — AB (ref 65–99)
Glucose-Capillary: 89 mg/dL (ref 65–99)
Glucose-Capillary: 92 mg/dL (ref 65–99)

## 2015-01-30 MED ORDER — CIPROFLOXACIN HCL 250 MG PO TABS: 500.0000 mg | ORAL_TABLET | Freq: Two times a day (BID) | ORAL | Status: DC

## 2015-01-30 MED ORDER — GLIPIZIDE ER 5 MG PO TB24: 5.0000 mg | ORAL_TABLET | Freq: Every day | ORAL | Status: DC

## 2015-01-30 MED ORDER — CIPROFLOXACIN HCL 500 MG PO TABS: 500.0000 mg | ORAL_TABLET | Freq: Two times a day (BID) | ORAL | Status: DC

## 2015-01-30 NOTE — Discharge Summary (Signed)
Physician Discharge Summary  Cameron Noble [id number] DOB: 1960-08-19 DOA: 01/27/2015  PCP: Chevis Pretty, FNP  Admit date: 01/27/2015 Discharge date: 01/30/2015  Time spent: 35 minutes  Recommendations for Outpatient Follow-up:  1. Follow up with Dr Jeffie Pollock next week as instructed.  2. Follow up with PCP as scheduled.    Discharge Diagnoses:  Principal Problem:   Hydronephrosis with urinary obstruction due to ureteral calculus Active Problems:   Essential hypertension, benign   Hyperlipidemia with target LDL less than 100   Diabetes ([place])   UTI (lower urinary tract infection)   AKI (acute kidney injury) ([place])   Discharge Condition: improved.  No fever, chills, and AKI resolved.   Diet recommendation: cardiac, carb modified diet   Filed Weights   01/27/15 1056 01/28/15 0419  Weight: 87.091 kg (192 lb) 88.315 kg (194 lb 11.2 oz)    History of present illness: Patient was admitted by me on Jan 27, 2015 for AKI due to left UPJ stone, hydronephrosis, and sepsis due to UTI.   As per my H and P: " Cameron Noble is a 55 yo BM who I was asked to see in consultation by Dr. Marin Comment for a LUPJ stone with fever. He had the onset of pain about 2 weeks ago and began to have fever and chills over the weekend. His urine is infected and his WBC count is 23K. He has had no prior stones. He has a 13mm stone at the left UPJ with obstruction. He also has ARI.   Hospital Course: Patient was given IVF, and urology was consulted, taken him to the OR and place a ureteral stent.  He was placed on IV Rocephin for his sepsis due to pyelonehpritis.   He continued to spike fever as expected, and subsequently became defevescent, and felt well.  His urine culture grew E coli, sensistive to both Rocephin and Cipro.  His AKI also resolving and he will be discharged home on no ACE I.  His metformin was changed to Glucotrol as well.  He will finish the antibiotic with Cipro 500mg  BID for 10 days.   He will see  urologist next week as scheduled, with plan to have his stent removal per urology.  He will see his PCP next week as well.  Decision about ACE I and Metformin will be decided by PCP when it was felt confident that CKD or AKI would no longer be a problem.  Thank you and Good Day.   Discharge Exam: Filed Vitals:   01/29/15 2211 01/30/15 0555  BP: 129/65 133/59  Pulse: 84 68  Temp: 101 F (38.3 C) 98.5 F (36.9 C)  Resp: 18 18    Discharge Instructions   Discharge Instructions    Diet - low sodium heart healthy    Complete by:  As directed      Discharge instructions    Complete by:  As directed   Take your antibiotics as prescribed.  Follow up with Urologist as discussed.     Increase activity slowly    Complete by:  As directed           Current Discharge Medication List    START taking these medications   Details  ciprofloxacin (CIPRO) 500 MG tablet Take 1 tablet (500 mg total) by mouth 2 (two) times daily. Qty: 20 tablet, Refills: 0    glipiZIDE (GLUCOTROL XL) 5 MG 24 hr tablet Take 1 tablet (5 mg total) by mouth daily with breakfast. Qty: 30 tablet, Refills:  1      CONTINUE these medications which have NOT CHANGED   Details  aspirin 81 MG tablet Take 81 mg by mouth daily.    atorvastatin (LIPITOR) 10 MG tablet Take 1 tablet (10 mg total) by mouth daily. Qty: 90 tablet, Refills: 1   Associated Diagnoses: Hyperlipidemia with target LDL less than 100    fish oil-omega-3 fatty acids 1000 MG capsule Take 2 g by mouth daily.    fluticasone (FLONASE) 50 MCG/ACT nasal spray Place 2 sprays into both nostrils daily. Qty: 16 g, Refills: 2      STOP taking these medications     lisinopril (PRINIVIL,ZESTRIL) 10 MG tablet      metFORMIN (GLUCOPHAGE) 1000 MG tablet      VITAMIN D, ERGOCALCIFEROL, PO        No Known Allergies Follow-up Information    Follow up with Malka So, MD.   Specialty:  Urology   Why:  We will arrange follow up therapy for 1-2 weeks to  allow clearance of the infection.   Contact information:   [place] [address] [phone]        The results of significant diagnostics from this hospitalization (including imaging, microbiology, ancillary and laboratory) are listed below for reference.    Significant Diagnostic Studies: Ct Abdomen Pelvis Wo Contrast  01/27/2015  CLINICAL DATA:  Left lower quadrant pain for 2 weeks. Elevated creatinine. Diarrhea. EXAM: CT ABDOMEN AND PELVIS WITHOUT CONTRAST TECHNIQUE: Multidetector CT imaging of the abdomen and pelvis was performed following the standard protocol without IV contrast. COMPARISON:  None. FINDINGS: Lower chest: No significant pulmonary nodules or acute consolidative airspace disease. Hepatobiliary: Normal liver with no liver mass. Normal gallbladder with no radiopaque cholelithiasis. No biliary ductal dilatation. Pancreas: Normal, with no mass or duct dilation. Spleen: Normal size. No mass. Adrenals/Urinary Tract: Normal adrenals. There is an obstructing 10 x 8 x 13 mm stone at the left ureteropelvic junction with mild left hydronephrosis, asymmetric enlargement of the left kidney and left perinephric fat stranding. No right hydronephrosis. No additional urolithiasis. No contour deforming renal masses. Stomach/Bowel: Grossly normal stomach. Normal caliber small bowel with no small bowel wall thickening. Normal appendix. Normal large bowel with no diverticulosis, large bowel wall thickening or pericolonic fat stranding. Vascular/Lymphatic: Normal caliber abdominal aorta. No pathologically enlarged lymph nodes in the abdomen or pelvis. Reproductive: Mild prostatomegaly. Other: No pneumoperitoneum, ascites or focal fluid collection. Musculoskeletal: No aggressive appearing focal osseous lesions. Severe degenerative disc disease in the lower lumbar spine. Small fat containing umbilical hernia. IMPRESSION: 1. Obstructing 10 x 8 x 13 mm stone at the left UPJ with mild left  hydronephrosis and left renal and perinephric edema. 2. Mild prostatomegaly. Electronically Signed   By: Ilona Sorrel M.D.   On: 01/27/2015 16:44   Dg Retrograde Pyelogram  01/28/2015  CLINICAL DATA:  Ureteral calculus. EXAM: RETROGRADE PYELOGRAM COMPARISON:  CT 01/27/2015. FINDINGS: Double-J left ureteral stent is noted. Its proximal tip is projected over the left kidney. Lower portion the stent not imaged. Radiation dose 7.7 mGy. IMPRESSION: Double-J left ureteral stent noted as above . Electronically Signed   By: Marcello Moores  Register   On: 01/28/2015 07:57    Microbiology: Recent Results (from the past 240 hour(s))  Urine culture     Status: None   Collection Time: 01/27/15  2:00 PM  Result Value Ref Range Status   Specimen Description URINE, RANDOM  Final   Special Requests NONE  Final  Culture   Final    >=100,000 COLONIES/mL KLEBSIELLA PNEUMONIAE Performed at [facility]    Report Status 01/29/2015 FINAL  Final   Organism ID, Bacteria KLEBSIELLA PNEUMONIAE  Final      Susceptibility   Klebsiella pneumoniae - MIC*    AMPICILLIN >=32 RESISTANT Resistant     CEFAZOLIN <=4 SENSITIVE Sensitive     CEFTRIAXONE <=1 SENSITIVE Sensitive     CIPROFLOXACIN <=0.25 SENSITIVE Sensitive     GENTAMICIN <=1 SENSITIVE Sensitive     NITROFURANTOIN 32 SENSITIVE Sensitive     AMPICILLIN/SULBACTAM 4 SENSITIVE Sensitive     * >=100,000 COLONIES/mL KLEBSIELLA PNEUMONIAE  Culture, blood (routine x 2)     Status: None (Preliminary result)   Collection Time: 01/27/15  6:05 PM  Result Value Ref Range Status   Specimen Description BLOOD LEFT ARM  Final   Special Requests BOTTLES DRAWN AEROBIC AND ANAEROBIC 8CC  Final   Culture NO GROWTH 3 DAYS  Final   Report Status PENDING  Incomplete  Culture, blood (routine x 2)     Status: None (Preliminary result)   Collection Time: 01/27/15  6:10 PM  Result Value Ref Range Status   Specimen Description BLOOD LEFT HAND  Final   Special Requests BOTTLES  DRAWN AEROBIC AND ANAEROBIC 8CC  Final   Culture NO GROWTH 3 DAYS  Final   Report Status PENDING  Incomplete     Labs: Basic Metabolic Panel:  Recent Labs Lab 01/27/15 1112 01/28/15 0525 01/29/15 0623 01/30/15 0524  NA 138 138 135 139  K 3.9 3.5 3.4* 3.5  CL 101 106 104 108  CO2 27 25 25 26   GLUCOSE 247* 134* 126* 92  BUN 33* 24* 22* 20  CREATININE 2.39* 1.93* 1.69* 1.53*  CALCIUM 9.4 8.1* 8.0* 8.4*   Liver Function Tests:  Recent Labs Lab 01/27/15 1112 01/28/15 0525  AST 26 18  ALT 21 18  ALKPHOS 89 75  BILITOT 1.2 1.2  PROT 7.6 6.3*  ALBUMIN 3.4* 2.7*    Recent Labs Lab 01/27/15 1112  LIPASE 16  CBC:  Recent Labs Lab 01/27/15 1112 01/28/15 0525 01/29/15 0623  WBC 23.0* 18.6* 14.1*  HGB 12.0* 11.3* 11.0*  HCT 36.2* 32.9* 31.7*  MCV 85.2 84.6 83.6  PLT 206 217 222    CBG:  Recent Labs Lab 01/29/15 1640 01/29/15 2002 01/30/15 0004 01/30/15 0748 01/30/15 1154  GLUCAP 142* 137* 92 89 106*   Signed:  Jonnell Hentges MD. Rosalita Chessman. [facility] 01/30/2015, 12:38 PM

## 2015-01-30 NOTE — Care Management Note (Signed)
Case Management Note  Patient Details  Name: Cameron Noble MRN: [id number] Date of Birth: 06-Sep-1960    Expected Discharge Date:      01/29/2015            Expected Discharge Plan:  Home/Self Care  In-House Referral:  NA  Discharge planning Services  CM Consult  Post Acute Care Choice:  NA Choice offered to:  NA  DME Arranged:    DME Agency:     HH Arranged:    [place] Agency:     Status of Service:  Completed, signed off  Medicare Important Message Given:    Date Medicare IM Given:    Medicare IM give by:    Date Additional Medicare IM Given:    Additional Medicare Important Message give by:     If discussed at [place] of Stay Meetings, dates discussed:    Additional Comments: Pt discharging home today with self care. No CM needs.  Sherald Barge, RN 01/30/2015, 1:23 PM

## 2015-01-30 NOTE — Progress Notes (Signed)
Patient states understanding of discharge instructions.  

## 2015-01-31 LAB — GLUCOSE, CAPILLARY: GLUCOSE-CAPILLARY: 95 mg/dL (ref 65–99)

## 2015-02-01 LAB — CULTURE, BLOOD (ROUTINE X 2)
CULTURE: NO GROWTH
Culture: NO GROWTH

## 2015-02-03 ENCOUNTER — Other Ambulatory Visit: Payer: Self-pay | Admitting: Urology

## 2015-02-04 ENCOUNTER — Encounter (HOSPITAL_COMMUNITY): Payer: Self-pay | Admitting: *Deleted

## 2015-02-04 NOTE — Progress Notes (Signed)
Spoke with patient on phone instructed him  To call doctors office today if he does not get ESWL info in the mail today. Asked him to write down all the instructions I gave him today and can have wife call us for more information if needed. Stressed to him to have bedtime snack Wed. Night then NPO and hold diabetes medication on Thu;rs. AM Patient verbalizes understanding.

## 2015-02-05 NOTE — H&P (Signed)
Subjective: Cameron Noble is a 55 yo BM who I was asked to see in consultation by Dr. Marin Comment for a LUPJ stone with fever. He had the onset of pain about 2 weeks ago and began to have fever and chills over the weekend. His urine is infected and his WBC count is 23K. He has had no prior stones. He has a 54mm stone at the left UPJ with obstruction. He also has ARI.  ROS:  Review of Systems  Constitutional: Positive for fever and chills.  Respiratory: Negative for shortness of breath.  Cardiovascular: Negative for chest pain.  Gastrointestinal: Positive for nausea. Negative for vomiting.  Genitourinary: Positive for flank pain. Negative for dysuria.  All other systems reviewed and are negative.   No Known Allergies  Past Medical History  Diagnosis Date  . Allergic rhinitis   . DDD (degenerative disc disease), cervical   . Diverticulosis   . Diabetes mellitus   . Hypertension   . Hyperlipidemia   . URI (upper respiratory infection)     Past Surgical History  Procedure Laterality Date  . Back surgery  03/2002    Dr. Trenton Gammon   . Ddd c-spine repair      Social History   Social History  . Marital Status: Married    Spouse Name: N/A  . Number of Children: N/A  . Years of Education: N/A   Occupational History  . Not on file.   Social History Main Topics  . Smoking status: Never Smoker   . Smokeless tobacco: Not on file  . Alcohol Use: Yes     Comment: Occasional   . Drug Use: No  . Sexual Activity: Not on file   Other Topics Concern  . Not on file   Social History Narrative    History reviewed. No pertinent family history.  Anti-infectives: Anti-infectives    Start   Dose/Rate Route Frequency Ordered Stop   01/27/15 1800  cefTRIAXone (ROCEPHIN) 1 g in dextrose 5 % 50 mL IVPB    1 g 100 mL/hr over 30 Minutes Intravenous Once 01/27/15 1745  01/27/15 1940   01/27/15 1430  ciprofloxacin (CIPRO) IVPB 400 mg    400 mg 200 mL/hr over 60 Minutes Intravenous Once 01/27/15 1417 01/27/15 1546   01/27/15 1430  metroNIDAZOLE (FLAGYL) IVPB 500 mg    500 mg 100 mL/hr over 60 Minutes Intravenous Once 01/27/15 1417 01/27/15 1708      Current Facility-Administered Medications  Medication Dose Route Frequency Provider Last Rate Last Dose  . 0.9 % sodium chloride infusion 1,000 mL Intravenous Continuous Delora Fuel, MD  Stopped at 01/27/15 1940  . diatrizoate meglumine-sodium (GASTROGRAFIN) 66-10 % solution        . sodium chloride 0.9 % bolus 1,000 mL 1,000 mL Intravenous Once Delora Fuel, MD 0000000 mL/hr at 01/27/15 1755 1,000 mL at 01/27/15 1755   Current Outpatient Prescriptions  Medication Sig Dispense Refill  . aspirin 81 MG tablet Take 81 mg by mouth daily.    Marland Kitchen atorvastatin (LIPITOR) 10 MG tablet Take 1 tablet (10 mg total) by mouth daily. 90 tablet 1  . fish oil-omega-3 fatty acids 1000 MG capsule Take 2 g by mouth daily.    . fluticasone (FLONASE) 50 MCG/ACT nasal spray Place 2 sprays into both nostrils daily. 16 g 2  . lisinopril (PRINIVIL,ZESTRIL) 10 MG tablet Take 1 tablet (10 mg total) by mouth daily. 90 tablet 1  . metFORMIN (GLUCOPHAGE) 1000 MG tablet Take 1 tablet (1,000 mg  total) by mouth 2 (two) times daily with a meal. 180 tablet 1  . VITAMIN D, ERGOCALCIFEROL, PO Take by mouth.     Past, family and social history reviewed and updated.   Objective: Vital signs in last 24 hours: Temp: [98.2 F (36.8 C)-102.4 F (39.1 C)] 100.2 F (37.9 C) (01/16 1942) Pulse Rate: [92-102] 99 (01/16 1942) Resp: [16-18] 16 (01/16 1942) BP: (97-134)/(50-78) 107/50 mmHg (01/16 1942) SpO2: [96 %-100 %] 97 % (01/16 1942) Weight: [87.091 kg (192 lb)-87.363 kg (192 lb 9.6 oz)] 87.091 kg (192 lb) (01/16 1056)  Intake/Output  from previous day:   Intake/Output this shift:     Physical Exam  Constitutional: He is oriented to person, place, and time and well-developed, well-nourished, and in no distress.  HENT:  Head: Normocephalic and atraumatic.  Neck: Normal range of motion. Neck supple. No thyromegaly present.  Cardiovascular: Normal rate, regular rhythm and normal heart sounds.  Pulmonary/Chest: Effort normal and breath sounds normal. No respiratory distress.  Abdominal: Soft. Bowel sounds are normal. He exhibits no mass. There is tenderness. There is guarding.  Musculoskeletal: Normal range of motion. He exhibits no edema or tenderness.  Lymphadenopathy:   He has cervical adenopathy.   Right: No supraclavicular adenopathy present.   Left: No supraclavicular adenopathy present.  Neurological: He is alert and oriented to person, place, and time.  Skin: Skin is warm and dry.  Psychiatric: Mood and affect normal.  Vitals reviewed.   Lab Results:   Recent Labs (last 2 labs)      Recent Labs  01/27/15 1112  WBC 23.0*  HGB 12.0*  HCT 36.2*  PLT 206     BMET  Recent Labs (last 2 labs)      Recent Labs  01/27/15 1112  NA 138  K 3.9  CL 101  CO2 27  GLUCOSE 247*  BUN 33*  CREATININE 2.39*  CALCIUM 9.4     PT/INR  Recent Labs (last 2 labs)     No results for input(s): LABPROT, INR in the last 72 hours.   ABG  Recent Labs (last 2 labs)     No results for input(s): PHART, HCO3 in the last 72 hours.  Invalid input(s): PCO2, PO2    Studies/Results:  Imaging Results (Last 48 hours)    Ct Abdomen Pelvis Wo Contrast  01/27/2015 CLINICAL DATA: Left lower quadrant pain for 2 weeks. Elevated creatinine. Diarrhea. EXAM: CT ABDOMEN AND PELVIS WITHOUT CONTRAST TECHNIQUE: Multidetector CT imaging of the abdomen and pelvis was performed following the standard protocol without IV contrast. COMPARISON: None. FINDINGS: Lower chest: No significant  pulmonary nodules or acute consolidative airspace disease. Hepatobiliary: Normal liver with no liver mass. Normal gallbladder with no radiopaque cholelithiasis. No biliary ductal dilatation. Pancreas: Normal, with no mass or duct dilation. Spleen: Normal size. No mass. Adrenals/Urinary Tract: Normal adrenals. There is an obstructing 10 x 8 x 13 mm stone at the left ureteropelvic junction with mild left hydronephrosis, asymmetric enlargement of the left kidney and left perinephric fat stranding. No right hydronephrosis. No additional urolithiasis. No contour deforming renal masses. Stomach/Bowel: Grossly normal stomach. Normal caliber small bowel with no small bowel wall thickening. Normal appendix. Normal large bowel with no diverticulosis, large bowel wall thickening or pericolonic fat stranding. Vascular/Lymphatic: Normal caliber abdominal aorta. No pathologically enlarged lymph nodes in the abdomen or pelvis. Reproductive: Mild prostatomegaly. Other: No pneumoperitoneum, ascites or focal fluid collection. Musculoskeletal: No aggressive appearing focal osseous lesions. Severe degenerative disc disease in the  lower lumbar spine. Small fat containing umbilical hernia. IMPRESSION: 1. Obstructing 10 x 8 x 13 mm stone at the left UPJ with mild left hydronephrosis and left renal and perinephric edema. 2. Mild prostatomegaly. Electronically Signed By: Ilona Sorrel M.D. On: 01/27/2015 16:44      Assessment: Large left UPJ stone with infection.  I am going to place a left ureteral stent and reviewed the risks of bleeding,infection, ureteral injury,need for secondary procedures, possible need for a percutaneous nephrostomy, thrombotic events and anesthetic complications.     CC: Dr. Orvan Falconer.      Malka So 01/27/2015 [id number]       Routing History     Date/Time From To Method   01/27/2015 8:54 PM Irine Seal, MD Irine Seal, MD Fax   01/27/2015 8:54 PM Irine Seal, MD Mary-Margaret  Hassell Done, FNP In Basket        He is now for lithoptripsy of Left ureteral stone.

## 2015-02-06 ENCOUNTER — Ambulatory Visit (HOSPITAL_COMMUNITY): Payer: BLUE CROSS/BLUE SHIELD

## 2015-02-06 ENCOUNTER — Ambulatory Visit (HOSPITAL_COMMUNITY)
Admission: RE | Admit: 2015-02-06 | Discharge: 2015-02-06 | Disposition: A | Discharge status: Home / Self Care | Payer: BLUE CROSS/BLUE SHIELD | Source: Ambulatory Visit | Attending: Urology | Admitting: Urology

## 2015-02-06 ENCOUNTER — Encounter (HOSPITAL_COMMUNITY)
Admission: RE | Disposition: A | Discharge status: Home / Self Care | Payer: Self-pay | Source: Ambulatory Visit | Attending: Urology

## 2015-02-06 ENCOUNTER — Encounter (HOSPITAL_COMMUNITY): Payer: Self-pay | Admitting: General Practice

## 2015-02-06 DIAGNOSIS — N201 Calculus of ureter: Secondary | ICD-10-CM | POA: Diagnosis present

## 2015-02-06 DIAGNOSIS — Z7951 Long term (current) use of inhaled steroids: Secondary | ICD-10-CM | POA: Insufficient documentation

## 2015-02-06 DIAGNOSIS — Z7982 Long term (current) use of aspirin: Secondary | ICD-10-CM | POA: Insufficient documentation

## 2015-02-06 DIAGNOSIS — M5030 Other cervical disc degeneration, unspecified cervical region: Secondary | ICD-10-CM | POA: Diagnosis not present

## 2015-02-06 DIAGNOSIS — N132 Hydronephrosis with renal and ureteral calculous obstruction: Secondary | ICD-10-CM | POA: Insufficient documentation

## 2015-02-06 DIAGNOSIS — E785 Hyperlipidemia, unspecified: Secondary | ICD-10-CM | POA: Insufficient documentation

## 2015-02-06 DIAGNOSIS — I10 Essential (primary) hypertension: Secondary | ICD-10-CM | POA: Diagnosis not present

## 2015-02-06 DIAGNOSIS — E119 Type 2 diabetes mellitus without complications: Secondary | ICD-10-CM | POA: Diagnosis not present

## 2015-02-06 DIAGNOSIS — Z79899 Other long term (current) drug therapy: Secondary | ICD-10-CM | POA: Insufficient documentation

## 2015-02-06 DIAGNOSIS — Z7984 Long term (current) use of oral hypoglycemic drugs: Secondary | ICD-10-CM | POA: Insufficient documentation

## 2015-02-06 LAB — BASIC METABOLIC PANEL
ANION GAP: 10 (ref 5–15)
BUN: 22 mg/dL — ABNORMAL HIGH (ref 6–20)
CALCIUM: 9.8 mg/dL (ref 8.9–10.3)
CO2: 24 mmol/L (ref 22–32)
Chloride: 107 mmol/L (ref 101–111)
Creatinine, Ser: 1.43 mg/dL — ABNORMAL HIGH (ref 0.61–1.24)
GFR calc non Af Amer: 53 mL/min — ABNORMAL LOW (ref >60)
Glucose, Bld: 111 mg/dL — ABNORMAL HIGH (ref 65–99)
Potassium: 4.2 mmol/L (ref 3.5–5.1)
Sodium: 141 mmol/L (ref 135–145)

## 2015-02-06 LAB — CBC
HCT: 37.1 % — ABNORMAL LOW (ref 39.0–52.0)
HEMOGLOBIN: 12.2 g/dL — AB (ref 13.0–17.0)
MCH: 27.9 pg (ref 26.0–34.0)
MCHC: 32.9 g/dL (ref 30.0–36.0)
MCV: 84.7 fL (ref 78.0–100.0)
Platelets: 505 10*3/uL — ABNORMAL HIGH (ref 150–400)
RBC: 4.38 MIL/uL (ref 4.22–5.81)
RDW: 16.8 % — ABNORMAL HIGH (ref 11.5–15.5)
WBC: 14.3 10*3/uL — AB (ref 4.0–10.5)

## 2015-02-06 LAB — GLUCOSE, CAPILLARY: GLUCOSE-CAPILLARY: 97 mg/dL (ref 65–99)

## 2015-02-06 SURGERY — LITHOTRIPSY, ESWL
Anesthesia: LOCAL | Laterality: Left

## 2015-02-06 MED ORDER — DIPHENHYDRAMINE HCL 25 MG PO CAPS
25.0000 mg | ORAL_CAPSULE | ORAL | Status: AC
  Administered 2015-02-06: 25 mg via ORAL
  Filled 2015-02-06: qty 1

## 2015-02-06 MED ORDER — CIPROFLOXACIN HCL 500 MG PO TABS
500.0000 mg | ORAL_TABLET | ORAL | Status: AC
  Administered 2015-02-06: 500 mg via ORAL
  Filled 2015-02-06: qty 1

## 2015-02-06 MED ORDER — SODIUM CHLORIDE 0.9 % IV SOLN
INTRAVENOUS | Status: DC
  Administered 2015-02-06: 08:00:00 via INTRAVENOUS

## 2015-02-06 MED ORDER — DIAZEPAM 5 MG PO TABS
10.0000 mg | ORAL_TABLET | ORAL | Status: AC
  Administered 2015-02-06: 10 mg via ORAL
  Filled 2015-02-06: qty 2

## 2015-02-06 NOTE — Interval H&P Note (Signed)
History and Physical Interval Note:  02/06/2015 9:23 AM  Cameron Noble  has presented today for surgery, with the diagnosis of LEFT URETERAL PELVIC JUNCTION STONE   The various methods of treatment have been discussed with the patient and family. After consideration of risks, benefits and other options for treatment, the patient has consented to  Procedure(s): LEFT EXTRACORPOREAL SHOCK WAVE LITHOTRIPSY (ESWL) (Left) as a surgical intervention .  The patient's history has been reviewed, patient examined, no change in status, stable for surgery.  I have reviewed the patient's chart and labs.  Questions were answered to the patient's satisfaction.     [name] I Leona Pressly

## 2015-02-07 ENCOUNTER — Ambulatory Visit (INDEPENDENT_AMBULATORY_CARE_PROVIDER_SITE_OTHER): Payer: BLUE CROSS/BLUE SHIELD | Admitting: Urology

## 2015-02-07 ENCOUNTER — Other Ambulatory Visit: Payer: Self-pay | Admitting: Urology

## 2015-02-07 ENCOUNTER — Ambulatory Visit (HOSPITAL_COMMUNITY)
Admission: RE | Admit: 2015-02-07 | Discharge: 2015-02-07 | Disposition: A | Discharge status: Home / Self Care | Payer: BLUE CROSS/BLUE SHIELD | Source: Ambulatory Visit | Attending: Urology | Admitting: Urology

## 2015-02-07 ENCOUNTER — Other Ambulatory Visit: Payer: Self-pay | Admitting: Nurse Practitioner

## 2015-02-07 DIAGNOSIS — N201 Calculus of ureter: Secondary | ICD-10-CM

## 2015-02-07 DIAGNOSIS — Z87440 Personal history of urinary (tract) infections: Secondary | ICD-10-CM | POA: Diagnosis not present

## 2015-02-07 DIAGNOSIS — N200 Calculus of kidney: Secondary | ICD-10-CM

## 2015-02-17 ENCOUNTER — Other Ambulatory Visit: Payer: Self-pay | Admitting: Nurse Practitioner

## 2015-02-17 ENCOUNTER — Telehealth: Payer: Self-pay | Admitting: Nurse Practitioner

## 2015-02-17 NOTE — Telephone Encounter (Signed)
Please review and advise.

## 2015-02-17 NOTE — Telephone Encounter (Signed)
Need to know why they stopped lisinopril

## 2015-02-17 NOTE — Telephone Encounter (Signed)
Pt states he isn't sure why they took him off the lisinopril. He thinks they said it was because his BP was low.

## 2015-02-17 NOTE — Telephone Encounter (Signed)
Pt aware.

## 2015-02-17 NOTE — Telephone Encounter (Signed)
Add back half of tablet until seen again

## 2015-02-20 ENCOUNTER — Other Ambulatory Visit: Payer: Self-pay | Admitting: Urology

## 2015-02-20 ENCOUNTER — Ambulatory Visit (HOSPITAL_COMMUNITY)
Admission: RE | Admit: 2015-02-20 | Discharge: 2015-02-20 | Disposition: A | Discharge status: Home / Self Care | Payer: BLUE CROSS/BLUE SHIELD | Source: Ambulatory Visit | Attending: Urology | Admitting: Urology

## 2015-02-20 DIAGNOSIS — N200 Calculus of kidney: Secondary | ICD-10-CM

## 2015-02-28 ENCOUNTER — Ambulatory Visit (INDEPENDENT_AMBULATORY_CARE_PROVIDER_SITE_OTHER): Payer: Self-pay | Admitting: Urology

## 2015-02-28 DIAGNOSIS — N201 Calculus of ureter: Secondary | ICD-10-CM

## 2015-02-28 DIAGNOSIS — Z87440 Personal history of urinary (tract) infections: Secondary | ICD-10-CM

## 2015-03-18 ENCOUNTER — Ambulatory Visit: Payer: BLUE CROSS/BLUE SHIELD | Admitting: Nurse Practitioner

## 2015-03-20 ENCOUNTER — Ambulatory Visit (INDEPENDENT_AMBULATORY_CARE_PROVIDER_SITE_OTHER): Payer: BLUE CROSS/BLUE SHIELD | Admitting: Nurse Practitioner

## 2015-03-20 ENCOUNTER — Encounter: Payer: Self-pay | Admitting: Nurse Practitioner

## 2015-03-20 VITALS — BP 113/69 | HR 72 | Temp 97.1°F | Ht 68.0 in | Wt 201.0 lb

## 2015-03-20 DIAGNOSIS — E0810 Diabetes mellitus due to underlying condition with ketoacidosis without coma: Secondary | ICD-10-CM | POA: Diagnosis not present

## 2015-03-20 DIAGNOSIS — E785 Hyperlipidemia, unspecified: Secondary | ICD-10-CM

## 2015-03-20 DIAGNOSIS — I10 Essential (primary) hypertension: Secondary | ICD-10-CM | POA: Diagnosis not present

## 2015-03-20 DIAGNOSIS — Z6829 Body mass index (BMI) 29.0-29.9, adult: Secondary | ICD-10-CM

## 2015-03-20 MED ORDER — ATORVASTATIN CALCIUM 10 MG PO TABS: 10.0000 mg | ORAL_TABLET | Freq: Every day | ORAL | Status: DC

## 2015-03-20 MED ORDER — GLIPIZIDE ER 5 MG PO TB24: 5.0000 mg | ORAL_TABLET | Freq: Every day | ORAL | Status: DC

## 2015-03-20 MED ORDER — LISINOPRIL 10 MG PO TABS: 10.0000 mg | ORAL_TABLET | Freq: Every day | ORAL | Status: DC

## 2015-03-20 NOTE — Patient Instructions (Signed)
Hypertension Hypertension, commonly called high blood pressure, is when the force of blood pumping through your arteries is too strong. Your arteries are the blood vessels that carry blood from your heart throughout your body. A blood pressure reading consists of a higher number over a lower number, such as 110/72. The higher number (systolic) is the pressure inside your arteries when your heart pumps. The lower number (diastolic) is the pressure inside your arteries when your heart relaxes. Ideally you want your blood pressure below 120/80. Hypertension forces your heart to work harder to pump blood. Your arteries may become narrow or stiff. Having untreated or uncontrolled hypertension can cause heart attack, stroke, kidney disease, and other problems. RISK FACTORS Some risk factors for high blood pressure are controllable. Others are not.  Risk factors you cannot control include:   Race. You may be at higher risk if you are African American.  Age. Risk increases with age.  Gender. Men are at higher risk than women before age 45 years. After age 65, women are at higher risk than men. Risk factors you can control include:  Not getting enough exercise or physical activity.  Being overweight.  Getting too much fat, sugar, calories, or salt in your diet.  Drinking too much alcohol. SIGNS AND SYMPTOMS Hypertension does not usually cause signs or symptoms. Extremely high blood pressure (hypertensive crisis) may cause headache, anxiety, shortness of breath, and nosebleed. DIAGNOSIS To check if you have hypertension, your health care provider will measure your blood pressure while you are seated, with your arm held at the level of your heart. It should be measured at least twice using the same arm. Certain conditions can cause a difference in blood pressure between your right and left arms. A blood pressure reading that is higher than normal on one occasion does not mean that you need treatment. If  it is not clear whether you have high blood pressure, you may be asked to return on a different day to have your blood pressure checked again. Or, you may be asked to monitor your blood pressure at home for 1 or more weeks. TREATMENT Treating high blood pressure includes making lifestyle changes and possibly taking medicine. Living a healthy lifestyle can help lower high blood pressure. You may need to change some of your habits. Lifestyle changes may include:  Following the DASH diet. This diet is high in fruits, vegetables, and whole grains. It is low in salt, red meat, and added sugars.  Keep your sodium intake below 2,300 mg per day.  Getting at least 30-45 minutes of aerobic exercise at least 4 times per week.  Losing weight if necessary.  Not smoking.  Limiting alcoholic beverages.  Learning ways to reduce stress. Your health care provider may prescribe medicine if lifestyle changes are not enough to get your blood pressure under control, and if one of the following is true:  You are 18-59 years of age and your systolic blood pressure is above 140.  You are 60 years of age or older, and your systolic blood pressure is above 150.  Your diastolic blood pressure is above 90.  You have diabetes, and your systolic blood pressure is over 140 or your diastolic blood pressure is over 90.  You have kidney disease and your blood pressure is above 140/90.  You have heart disease and your blood pressure is above 140/90. Your personal target blood pressure may vary depending on your medical conditions, your age, and other factors. HOME CARE INSTRUCTIONS    Have your blood pressure rechecked as directed by your health care provider.   Take medicines only as directed by your health care provider. Follow the directions carefully. Blood pressure medicines must be taken as prescribed. The medicine does not work as well when you skip doses. Skipping doses also puts you at risk for  problems.  Do not smoke.   Monitor your blood pressure at home as directed by your health care provider. SEEK MEDICAL CARE IF:   You think you are having a reaction to medicines taken.  You have recurrent headaches or feel dizzy.  You have swelling in your ankles.  You have trouble with your vision. SEEK IMMEDIATE MEDICAL CARE IF:  You develop a severe headache or confusion.  You have unusual weakness, numbness, or feel faint.  You have severe chest or abdominal pain.  You vomit repeatedly.  You have trouble breathing. MAKE SURE YOU:   Understand these instructions.  Will watch your condition.  Will get help right away if you are not doing well or get worse.   This information is not intended to replace advice given to you by your health care provider. Make sure you discuss any questions you have with your health care provider.   Document Released: 12/28/2004 Document Revised: 05/14/2014 Document Reviewed: 10/20/2012 Elsevier Interactive Patient Education 2016 [employer]  

## 2015-03-20 NOTE — Progress Notes (Signed)
Subjective:    Patient ID: Cameron Noble, male    DOB: 09-28-1960, 55 y.o.   MRN: [id number]  Patient here today for follow up of chronic medical problems.  Diabetes He presents for his follow-up diabetic visit. He has type 2 diabetes mellitus. No MedicAlert identification noted. His disease course has been stable. There are no hypoglycemic associated symptoms. Pertinent negatives for hypoglycemia include no headaches. There are no diabetic associated symptoms. Pertinent negatives for diabetes include no chest pain and no visual change. There are no hypoglycemic complications. Symptoms are stable. There are no diabetic complications. Risk factors for coronary artery disease include diabetes mellitus, dyslipidemia, hypertension and male sex. Current diabetic treatment includes oral agent (monotherapy). He is compliant with treatment most of the time. His weight is stable. When asked about meal planning, he reported none. He has not had a previous visit with a dietitian. He participates in exercise weekly. His breakfast blood glucose is taken between 8-9 am. His breakfast blood glucose range is generally 110-130 mg/dl. His highest blood glucose is 180-200 mg/dl. His overall blood glucose range is 110-130 mg/dl. An ACE inhibitor/angiotensin II receptor blocker is being taken. He does not see a podiatrist.Eye exam is not current.  Hypertension This is a chronic problem. The current episode started more than 1 year ago. The problem is controlled. Pertinent negatives include no chest pain, headaches, palpitations or shortness of breath. Risk factors for coronary artery disease include diabetes mellitus, dyslipidemia and male gender. Past treatments include ACE inhibitors.  Hyperlipidemia This is a chronic problem. The current episode started more than 1 year ago. The problem is controlled. Recent lipid tests were reviewed and are normal. Exacerbating diseases include diabetes. He has no history of hypothyroidism  or obesity. Pertinent negatives include no chest pain, myalgias or shortness of breath. Current antihyperlipidemic treatment includes statins. The current treatment provides moderate improvement of lipids. Compliance problems include adherence to diet.  Risk factors for coronary artery disease include dyslipidemia, hypertension and male sex.      Review of Systems  Constitutional: Negative.   HENT: Negative.   Eyes: Negative.   Respiratory: Negative.  Negative for shortness of breath.   Cardiovascular: Negative.  Negative for chest pain and palpitations.  Gastrointestinal: Negative.   Endocrine: Negative.   Genitourinary: Negative.   Musculoskeletal: Negative.  Negative for myalgias.  Skin: Negative.   Allergic/Immunologic: Negative.   Neurological: Negative.  Negative for headaches.  Hematological: Negative.   Psychiatric/Behavioral: Negative.   All other systems reviewed and are negative.      Objective:   Physical Exam  Constitutional: He is oriented to person, place, and time. Vital signs are normal. He appears well-developed and well-nourished.  HENT:  Head: Normocephalic.  Right Ear: Tympanic membrane and external ear normal.  Left Ear: Tympanic membrane and external ear normal.  Nose: Nose normal.  Mouth/Throat: Uvula is midline, oropharynx is clear and moist and mucous membranes are normal.  Eyes: Conjunctivae and lids are normal. Pupils are equal, round, and reactive to light.  Neck: Trachea normal and normal range of motion. Neck supple. No JVD present.  Cardiovascular: Normal rate, regular rhythm, normal heart sounds and normal pulses.   No murmur heard. Pulmonary/Chest: Effort normal and breath sounds normal.  Abdominal: Soft. Bowel sounds are normal.  Musculoskeletal: Normal range of motion.  Neurological: He is alert and oriented to person, place, and time. He has normal strength.  Skin: Skin is warm, dry and intact.  No edema  Psychiatric: He has a normal  mood and affect. His speech is normal and behavior is normal. Judgment and thought content normal.   BP 113/69 mmHg  Pulse 72  Temp(Src) 97.1 F (36.2 C) (Oral)  Ht 5\' 8"  (1.727 m)  Wt 201 lb (91.173 kg)  BMI 30.57 kg/m2   hgba1c-6.3% in January      Assessment & Plan:   1. Essential hypertension, benign Do not add salt to diet - lisinopril (PRINIVIL,ZESTRIL) 10 MG tablet; Take 1 tablet (10 mg total) by mouth daily.  Dispense: 90 tablet; Refill: 1  2. Diabetes mellitus due to underlying condition with ketoacidosis without coma, without long-term current use of insulin (HCC) Strict carb counting Continue to hold metformin for now - glipiZIDE (GLUCOTROL XL) 5 MG 24 hr tablet; Take 1 tablet (5 mg total) by mouth daily with breakfast.  Dispense: 90 tablet; Refill: 1  3. Hyperlipidemia with target LDL less than 100 Low fat diet - atorvastatin (LIPITOR) 10 MG tablet; Take 1 tablet (10 mg total) by mouth daily.  Dispense: 90 tablet; Refill: 1  4. BMI 29.0-29.9,adult Discussed diet and exercise for person with BMI >25 Will recheck weight in 3-6 months     Labs pending Health maintenance reviewed Diet and exercise encouraged Continue all meds Follow up  In 3 month   [place], FNP

## 2015-03-28 ENCOUNTER — Ambulatory Visit (HOSPITAL_COMMUNITY)
Admission: RE | Admit: 2015-03-28 | Discharge: 2015-03-28 | Disposition: A | Discharge status: Home / Self Care | Payer: BLUE CROSS/BLUE SHIELD | Source: Ambulatory Visit | Attending: Urology | Admitting: Urology

## 2015-03-28 ENCOUNTER — Ambulatory Visit (INDEPENDENT_AMBULATORY_CARE_PROVIDER_SITE_OTHER): Payer: BLUE CROSS/BLUE SHIELD | Admitting: Urology

## 2015-03-28 ENCOUNTER — Other Ambulatory Visit: Payer: Self-pay | Admitting: Urology

## 2015-03-28 DIAGNOSIS — N201 Calculus of ureter: Secondary | ICD-10-CM | POA: Diagnosis present

## 2015-03-28 DIAGNOSIS — M545 Low back pain: Secondary | ICD-10-CM | POA: Diagnosis not present

## 2015-03-28 DIAGNOSIS — N202 Calculus of kidney with calculus of ureter: Secondary | ICD-10-CM | POA: Diagnosis not present

## 2015-03-28 DIAGNOSIS — N390 Urinary tract infection, site not specified: Secondary | ICD-10-CM | POA: Diagnosis not present

## 2015-03-28 DIAGNOSIS — N200 Calculus of kidney: Secondary | ICD-10-CM | POA: Diagnosis not present

## 2015-03-31 ENCOUNTER — Ambulatory Visit (INDEPENDENT_AMBULATORY_CARE_PROVIDER_SITE_OTHER): Payer: BLUE CROSS/BLUE SHIELD | Admitting: Family Medicine

## 2015-03-31 ENCOUNTER — Ambulatory Visit (INDEPENDENT_AMBULATORY_CARE_PROVIDER_SITE_OTHER): Payer: BLUE CROSS/BLUE SHIELD

## 2015-03-31 ENCOUNTER — Encounter: Payer: Self-pay | Admitting: Family Medicine

## 2015-03-31 VITALS — BP 102/56 | HR 74 | Temp 97.6°F | Ht 68.0 in | Wt 195.2 lb

## 2015-03-31 DIAGNOSIS — M549 Dorsalgia, unspecified: Secondary | ICD-10-CM | POA: Diagnosis not present

## 2015-03-31 DIAGNOSIS — G8929 Other chronic pain: Secondary | ICD-10-CM

## 2015-03-31 DIAGNOSIS — M5432 Sciatica, left side: Secondary | ICD-10-CM | POA: Insufficient documentation

## 2015-03-31 NOTE — Progress Notes (Addendum)
   HPI  Patient presents today to discuss back pain.  Patient explains that for the last 4-5 days he's had steadily improving left-sided low back pain Use long history of chronic back pain described as dull achy baseline pain that hurts worse after every day at work. His job is lifting heavy packages all day. He is status post discectomy of L4-L5 disc in 2003.  He describes acute onset left-sided low back pain with left-sided radiation down to his upper posterior thigh History of difficulty getting out of bed 4 days ago, he has been taking meloxicam and gradually improving with icy hot, massage, and decreased activity. He states that the pain is so persistent and, now that he would really like to do further intervention if possible He does not want any additional pain medications, meloxicam is helping  PMH: Smoking status noted ROS: Per HPI  Objective: BP 102/56 mmHg  Pulse 74  Temp(Src) 97.6 F (36.4 C) (Oral)  Ht 5\' 8"  (1.727 m)  Wt 195 lb 3.2 oz (88.542 kg)  BMI 29.69 kg/m2 Gen: NAD, alert, cooperative with exam HEENT: NCAT Ext: No edema, warm Neuro: Alert and oriented, 5/5 and sensation intact in bilateral lower extremities, 2+ patellar tendon reflexes Musculoskeletal: No paraspinal or spinal tenderness to palpation of the lumbar spine  DG lumbar Facet sclerotic changes but reasonably well preserved disc spaces No acute findings   Assessment and plan:  # Chronic back pain, sciatica resolving Discussed conservative therapy including physical therapy Continue NSAIDs Plain film Consider MRI Consider referral to orthopedics pending x-ray results     Orders Placed This Encounter  Procedures  . DG Lumbar Spine 2-3 Views    Standing Status: Future     Number of Occurrences:      Standing Expiration Date: 03/30/2016    Order Specific Question:  Reason for Exam (SYMPTOM  OR DIAGNOSIS REQUIRED)    Answer:  back pain    Order Specific Question:  Preferred imaging  location?    Answer:  Internal    Meds ordered this encounter  Medications  . meloxicam (MOBIC) 7.5 MG tablet    Sig: Take 7.5 mg by mouth daily.  . indapamide (LOZOL) 2.5 MG tablet    Sig: Take 2.5 mg by mouth daily.    Laroy Apple, MD [facility] 03/31/2015, 11:04 AM

## 2015-03-31 NOTE — Patient Instructions (Signed)
Great to meet you!  Come back with any worsening  We will call with your X ray results within a couple of days  We will consider an MRI and referral

## 2015-04-02 ENCOUNTER — Other Ambulatory Visit: Payer: Self-pay | Admitting: Nurse Practitioner

## 2015-04-08 ENCOUNTER — Ambulatory Visit: Payer: BLUE CROSS/BLUE SHIELD | Attending: Family Medicine | Admitting: Physical Therapy

## 2015-04-08 DIAGNOSIS — M5442 Lumbago with sciatica, left side: Secondary | ICD-10-CM

## 2015-04-08 DIAGNOSIS — M5416 Radiculopathy, lumbar region: Secondary | ICD-10-CM | POA: Diagnosis present

## 2015-04-08 NOTE — Therapy (Signed)
[facility] [place], [place], [address] Phone: [phone]   Fax:  [phone]  Physical Therapy Evaluation  Patient Details  Name: Taj Benard MRN: [id number] Date of Birth: 1960-03-28 Referring Provider: Kenn File MD.  Encounter Date: 04/08/2015      PT End of Session - 04/08/15 1429    Visit Number 1   Number of Visits 12   Date for PT Re-Evaluation 05/20/15   PT Start Time 0149   Activity Tolerance Patient tolerated treatment well   Behavior During Therapy [facility] for tasks assessed/performed      Past Medical History  Diagnosis Date  . Allergic rhinitis   . DDD (degenerative disc disease), cervical   . Diverticulosis   . Diabetes mellitus   . Hypertension   . Hyperlipidemia   . URI (upper respiratory infection)     Past Surgical History  Procedure Laterality Date  . Back surgery  03/2002    Dr. Trenton Gammon   . Ddd c-spine repair    . Cystoscopy with stent placement Left 01/27/2015    Procedure: CYSTOSCOPY, RETROGRADE, WITH LEFT STENT PLACEMENT;  Surgeon: Irine Seal, MD;  Location: [facility];  Service: Urology;  Laterality: Left;  I have 2 cases at [facility] and a foley to place at [facility].  I would guess it will be 830-9 before I get up to [facility].   Marland Kitchen Knee arthroscopy Right     There were no vitals filed for this visit.  Visit Diagnosis:  Radiculopathy, lumbar region - Plan: PT plan of care cert/re-cert  Left-sided low back pain with left-sided sciatica - Plan: PT plan of care cert/re-cert      Subjective Assessment - 04/08/15 1357    Subjective My pain was real bad a week ago.   Limitations Lifting   Patient Stated Goals Get out of pain.   Currently in Pain? Yes   Pain Score 3    Pain Location Back   Pain Orientation Left;Lower   Pain Descriptors / Indicators Aching;Sharp;Burning   Pain Type Acute pain   Pain Onset 1 to 4 weeks ago   Pain Frequency Constant   Aggravating Factors  Sleeping on soft bed.  Lifting.    Pain Relieving Factors Heat.            [facility] PT Assessment - 04/08/15 0001    Assessment   Medical Diagnosis Sciatica of left side.   Referring Provider Kenn File MD.   Onset Date/Surgical Date --  Ongoing.   Precautions   Precautions None   Restrictions   Weight Bearing Restrictions Yes   Balance Screen   Has the patient fallen in the past 6 months No   Has the patient had a decrease in activity level because of a fear of falling?  No   Is the patient reluctant to leave their home because of a fear of falling?  No   Home Ecologist residence   Prior Function   Level of Independence Independent   Posture/Postural Control   Posture/Postural Control No significant limitations   ROM / Strength   AROM / PROM / Strength AROM;Strength   AROM   Overall AROM Comments Active lumbar extension= 19 degrees and full active lumbar flexion.   Strength   Overall Strength Comments Normal bilateral LE strength.   Palpation   Palpation comment Some tenderness with deep palpation in left lower back region/SIJ area.   Special Tests    Special Tests --  Decr ACH DTR's.  Mildly (+) left SLR test. = leg lengths.   Ambulation/Gait   Gait Comments WNL.                   [facility] Adult PT Treatment/Exercise - 04/08/15 0001    Modalities   Modalities Electrical Stimulation;Moist Heat   Moist Heat Therapy   Number Minutes Moist Heat 20 Minutes   Moist Heat Location --  Low back.   Acupuncturist Location --  Low back.   Electrical Stimulation Action Pre-mod on constant.   Electrical Stimulation Parameters 80-150 HZ x 20 minutes.   Electrical Stimulation Goals Pain                     PT Long Term Goals - 04/08/15 1455    PT LONG TERM GOAL #1   Title Ind with an HEP.   Time 6   Period Weeks   Status New   PT LONG TERM GOAL #2   Title Perform ADL's with pain not > 3/10.   Time 6   Period  Weeks   Status New   PT LONG TERM GOAL #3   Title Eliminate left left pain   Time 6   Period Weeks   Status New               Plan - 04/08/15 1429    Clinical Impression Statement The patient has a long h/o low back pain but reports most days he is okay.  However, a week+ ago he had intense (near 10/10) pain with radiation from his left low back and into left buttock and to about his left mid-thigh.  His pain today is a low 3/10.  His pain is disturbed by sleep.  Recommended he sleep with a pillow between his knees (he is a side sleep).   Pt will benefit from skilled therapeutic intervention in order to improve on the following deficits Pain;Decreased activity tolerance   Rehab Potential Excellent   PT Frequency 2x / week   PT Duration 6 weeks   PT Treatment/Interventions ADLs/Self Care Home Management;Electrical Stimulation;Moist Heat;Therapeutic exercise;Therapeutic activities;Ultrasound;Traction;Patient/family education;Manual techniques   PT Next Visit Plan Core exercises; Modalities PRN and could try int traction at 40% body weight with radiculopathy persists.   Consulted and Agree with Plan of Care Patient         Problem List Patient Active Problem List   Diagnosis Date Noted  . Sciatica of left side 03/31/2015  . Chronic back pain 03/31/2015  . Hydronephrosis with urinary obstruction due to ureteral calculus 01/27/2015  . AKI (acute kidney injury) ([place]) 01/27/2015  . BMI 29.0-29.9,adult 08/12/2014  . Essential hypertension, benign 05/04/2012  . Hyperlipidemia with target LDL less than 100 05/04/2012  . Diabetes ([place]) 05/04/2012    Shekina Cordell, [place] MPT 04/08/2015, 2:58 PM  [facility] [address] [place], [place], [address] Phone: [phone]   Fax:  [phone]  Name: Robertjames Eisel MRN: [id number] Date of Birth: 1960/10/03

## 2015-04-11 ENCOUNTER — Other Ambulatory Visit: Payer: Self-pay | Admitting: Urology

## 2015-04-11 ENCOUNTER — Ambulatory Visit (INDEPENDENT_AMBULATORY_CARE_PROVIDER_SITE_OTHER): Payer: Self-pay | Admitting: Urology

## 2015-04-11 ENCOUNTER — Ambulatory Visit (HOSPITAL_COMMUNITY)
Admission: RE | Admit: 2015-04-11 | Discharge: 2015-04-11 | Disposition: A | Discharge status: Home / Self Care | Payer: BLUE CROSS/BLUE SHIELD | Source: Ambulatory Visit | Attending: Urology | Admitting: Urology

## 2015-04-11 DIAGNOSIS — N201 Calculus of ureter: Secondary | ICD-10-CM | POA: Diagnosis not present

## 2015-04-11 DIAGNOSIS — N390 Urinary tract infection, site not specified: Secondary | ICD-10-CM

## 2015-04-11 DIAGNOSIS — N200 Calculus of kidney: Secondary | ICD-10-CM

## 2015-04-17 ENCOUNTER — Ambulatory Visit: Payer: BLUE CROSS/BLUE SHIELD | Attending: Family Medicine | Admitting: *Deleted

## 2015-04-17 DIAGNOSIS — M5442 Lumbago with sciatica, left side: Secondary | ICD-10-CM | POA: Insufficient documentation

## 2015-04-17 DIAGNOSIS — M5416 Radiculopathy, lumbar region: Secondary | ICD-10-CM

## 2015-04-17 NOTE — Patient Instructions (Signed)
Isometric Abdominal   Lying on back with knees bent, tighten stomach by pulling navel down  (DRAW in). Hold __5__ seconds. Repeat __5__ times per set. Do __5__ sets per session. Do _3-5___ sessions per day.                      SITTING AND STANDING ALSO  http://orth.exer.us/1086   Copyright  VHI. All rights reserved.  Bent Leg Lift (Hook-Lying)   Tighten stomach and slowly raise right leg _6___ inches from floor. Keep trunk rigid. Hold _2-3___ seconds. Repeat _10___ times per set. Do _3___ sets per session. Do _2-3___ sessions per day.  http://orth.exer.us/1090   Copyright  VHI. All rights reserved.  Bridging   Slowly raise buttocks from floor, keeping stomach tight. Repeat __10__ times per set. Do _3___ sets per session. Do __2-3__ sessions per day.  http://orth.exer.us/1096   Copyright  VHI. All rights reserved.

## 2015-04-17 NOTE — Therapy (Signed)
[facility] [place], [place], [address] Phone: [phone]   Fax:  [phone]  Physical Therapy Treatment  Patient Details  Name: Cameron Noble MRN: [id number] Date of Birth: 12-09-1960 Referring Provider: Kenn File MD.  Encounter Date: 04/17/2015      PT End of Session - 04/17/15 1729    Visit Number 2   Number of Visits 12   Date for PT Re-Evaluation 05/20/15   PT Start Time 0945   PT Stop Time [id number]   PT Time Calculation (min) 50 min      Past Medical History  Diagnosis Date  . Allergic rhinitis   . DDD (degenerative disc disease), cervical   . Diverticulosis   . Diabetes mellitus   . Hypertension   . Hyperlipidemia   . URI (upper respiratory infection)     Past Surgical History  Procedure Laterality Date  . Back surgery  03/2002    Dr. Trenton Gammon   . Ddd c-spine repair    . Cystoscopy with stent placement Left 01/27/2015    Procedure: CYSTOSCOPY, RETROGRADE, WITH LEFT STENT PLACEMENT;  Surgeon: Irine Seal, MD;  Location: [facility];  Service: Urology;  Laterality: Left;  I have 2 cases at [facility] and a foley to place at [facility].  I would guess it will be 830-9 before I get up to [facility].   Marland Kitchen Knee arthroscopy Right     There were no vitals filed for this visit.  Visit Diagnosis:  Radiculopathy, lumbar region  Left-sided low back pain with left-sided sciatica      Subjective Assessment - 04/17/15 0953    Subjective My pain was real bad a week ago. LBP across LB   Limitations Lifting   Patient Stated Goals Get out of pain.   Currently in Pain? Yes   Pain Score 5    Pain Location Back   Pain Orientation Left;Lower   Pain Descriptors / Indicators Aching;Sharp;Burning   Pain Type Acute pain   Pain Onset 1 to 4 weeks ago                         [facility] Adult PT Treatment/Exercise - 04/17/15 0001    Exercises   Exercises Lumbar   Lumbar Exercises: Supine   Ab Set 5 seconds;20 reps  Focus on Drawin  Core Activation   Bent Knee Raise 20 reps;5 seconds  Marching   Bridge 20 reps;5 seconds   Modalities   Modalities Electrical Stimulation;Moist Heat   Moist Heat Therapy   Number Minutes Moist Heat 15 Minutes   Moist Heat Location Lumbar Spine   Electrical Stimulation   Electrical Stimulation Location LB paras premod x15 mins 80-150 hz hooklying   Electrical Stimulation Goals Pain   Manual Therapy   Manual Therapy Myofascial release;Soft tissue mobilization   Soft tissue mobilization STW/ IASTM to Bil LB paras and into SIJ with Pt LT side lying                PT Education - 04/17/15 1039    Education provided Yes   Education Details Core activation Exs   Person(s) Educated Patient   Methods Explanation;Demonstration;Tactile cues;Verbal cues;Handout   Comprehension Verbalized understanding;Returned demonstration             PT Long Term Goals - 04/08/15 1455    PT LONG TERM GOAL #1   Title Ind with an HEP.   Time 6   Period Weeks  Status New   PT LONG TERM GOAL #2   Title Perform ADL's with pain not > 3/10.   Time 6   Period Weeks   Status New   PT LONG TERM GOAL #3   Title Eliminate left left pain   Time 6   Period Weeks   Status New               Plan - 04/17/15 1722    Clinical Impression Statement Pt did fairly well today with Rx and was able to perform core activation exs with minimal pain increase in LB.  Pt also did well with STW with moderate tightness noted along LB paras and pain was about the same today LT and RT side LB so estim was applied to both sides in hooklying.  Pt did we  and had a normal response to modalities after removal.  Goals are ongoing   Pt will benefit from skilled therapeutic intervention in order to improve on the following deficits Pain;Decreased activity tolerance   Rehab Potential Excellent   PT Frequency 2x / week   PT Duration 6 weeks   PT Treatment/Interventions ADLs/Self Care Home Management;Electrical  Stimulation;Moist Heat;Therapeutic exercise;Therapeutic activities;Ultrasound;Traction;Patient/family education;Manual techniques   PT Next Visit Plan Core exercises; Modalities PRN and could try int traction at 40% body weight with radiculopathy persists.  review HEP   Consulted and Agree with Plan of Care Patient        Problem List Patient Active Problem List   Diagnosis Date Noted  . Sciatica of left side 03/31/2015  . Chronic back pain 03/31/2015  . Hydronephrosis with urinary obstruction due to ureteral calculus 01/27/2015  . AKI (acute kidney injury) ([place]) 01/27/2015  . BMI 29.0-29.9,adult 08/12/2014  . Essential hypertension, benign 05/04/2012  . Hyperlipidemia with target LDL less than 100 05/04/2012  . Diabetes ([place]) 05/04/2012    Shanyiah Conde,CHRIS, PTA 04/17/2015, 5:34 PM  [facility] [address] [place], [place], [address] Phone: [phone]   Fax:  [phone]  Name: Cameron Noble MRN: [id number] Date of Birth: 1960/06/24

## 2015-04-22 ENCOUNTER — Ambulatory Visit: Payer: BLUE CROSS/BLUE SHIELD | Admitting: *Deleted

## 2015-04-22 ENCOUNTER — Ambulatory Visit (HOSPITAL_COMMUNITY)
Admission: RE | Admit: 2015-04-22 | Discharge: 2015-04-22 | Disposition: A | Discharge status: Home / Self Care | Payer: BLUE CROSS/BLUE SHIELD | Source: Ambulatory Visit | Attending: Urology | Admitting: Urology

## 2015-04-22 DIAGNOSIS — N202 Calculus of kidney with calculus of ureter: Secondary | ICD-10-CM | POA: Diagnosis not present

## 2015-04-22 DIAGNOSIS — N200 Calculus of kidney: Secondary | ICD-10-CM

## 2015-04-22 DIAGNOSIS — M5442 Lumbago with sciatica, left side: Secondary | ICD-10-CM

## 2015-04-22 DIAGNOSIS — M5416 Radiculopathy, lumbar region: Secondary | ICD-10-CM | POA: Diagnosis not present

## 2015-04-22 NOTE — Therapy (Signed)
[facility] [place], [place], [address] Phone: [phone]   Fax:  [phone]  Physical Therapy Treatment  Patient Details  Name: Cameron Noble MRN: [id number] Date of Birth: 1960/12/23 Referring Provider: Kenn File MD.  Encounter Date: 04/22/2015      PT End of Session - 04/22/15 1041    Visit Number 3   Number of Visits 12   Date for PT Re-Evaluation 05/20/15   PT Start Time [id number]   PT Stop Time [id number]   PT Time Calculation (min) 49 min      Past Medical History  Diagnosis Date  . Allergic rhinitis   . DDD (degenerative disc disease), cervical   . Diverticulosis   . Diabetes mellitus   . Hypertension   . Hyperlipidemia   . URI (upper respiratory infection)     Past Surgical History  Procedure Laterality Date  . Back surgery  03/2002    Dr. Trenton Gammon   . Ddd c-spine repair    . Cystoscopy with stent placement Left 01/27/2015    Procedure: CYSTOSCOPY, RETROGRADE, WITH LEFT STENT PLACEMENT;  Surgeon: Irine Seal, MD;  Location: [facility];  Service: Urology;  Laterality: Left;  I have 2 cases at [facility] and a foley to place at [facility].  I would guess it will be 830-9 before I get up to [facility].   Marland Kitchen Knee arthroscopy Right     There were no vitals filed for this visit.      Subjective Assessment - 04/22/15 1039    Subjective My pain was real bad a week ago. LBP across LB LT >RT   Limitations Sitting;Lifting   Patient Stated Goals Get out of pain.   Currently in Pain? Yes   Pain Score 4    Pain Location Back   Pain Orientation Left;Right;Lower   Pain Descriptors / Indicators Aching;Burning;Sharp   Pain Type Acute pain   Pain Onset 1 to 4 weeks ago                         [facility] Adult PT Treatment/Exercise - 04/22/15 0001    Exercises   Exercises Lumbar   Lumbar Exercises: Supine   Ab Set 5 seconds;20 reps  Focus on Drawin Core Activation   Bent Knee Raise 20 reps;5 seconds  Marching   Bridge 20  reps;5 seconds   Lumbar Exercises: Prone   Straight Leg Raise 20 reps;3 seconds  with Drawin   Modalities   Modalities Electrical Stimulation;Moist Heat   Moist Heat Therapy   Number Minutes Moist Heat 15 Minutes   Moist Heat Location Lumbar Spine   Electrical Stimulation   Electrical Stimulation Location LB paras premod x15 mins 80-150 hz hooklying   Electrical Stimulation Goals Pain   Manual Therapy   Manual Therapy Myofascial release;Soft tissue mobilization   Soft tissue mobilization STW/ IASTM to Bil LB paras and into SIJ with Pt LT side lying                     PT Long Term Goals - 04/08/15 1455    PT LONG TERM GOAL #1   Title Ind with an HEP.   Time 6   Period Weeks   Status New   PT LONG TERM GOAL #2   Title Perform ADL's with pain not > 3/10.   Time 6   Period Weeks   Status New   PT LONG TERM GOAL #3  Title Eliminate left left pain   Time 6   Period Weeks   Status New               Plan - 04/22/15 1211    Clinical Impression Statement Pt did great today with core exs and was able to add prone leg raise to his HEP with minimal pain increase. He did well with Manual STW with less tightness noticed in LB paras, but still notable in LT QL. Goals are ongoing   Rehab Potential Excellent   PT Frequency 2x / week   PT Duration 6 weeks   PT Treatment/Interventions ADLs/Self Care Home Management;Electrical Stimulation;Moist Heat;Therapeutic exercise;Therapeutic activities;Ultrasound;Traction;Patient/family education;Manual techniques   PT Next Visit Plan Core exercises; Modalities PRN and could try int traction at 40% body weight with radiculopathy persists.  review HEP   Consulted and Agree with Plan of Care Patient      Patient will benefit from skilled therapeutic intervention in order to improve the following deficits and impairments:  Pain, Decreased activity tolerance  Visit Diagnosis: Radiculopathy, lumbar region  Left-sided low back  pain with left-sided sciatica     Problem List Patient Active Problem List   Diagnosis Date Noted  . Sciatica of left side 03/31/2015  . Chronic back pain 03/31/2015  . Hydronephrosis with urinary obstruction due to ureteral calculus 01/27/2015  . AKI (acute kidney injury) ([place]) 01/27/2015  . BMI 29.0-29.9,adult 08/12/2014  . Essential hypertension, benign 05/04/2012  . Hyperlipidemia with target LDL less than 100 05/04/2012  . Diabetes ([place]) 05/04/2012    RAMSEUR,CHRIS, PTA 04/22/2015, 12:14 PM  [facility] [place], [place], [address] Phone: [phone]   Fax:  [phone]  Name: Cameron Noble MRN: [id number] Date of Birth: Nov 15, 1960

## 2015-04-30 ENCOUNTER — Ambulatory Visit: Payer: BLUE CROSS/BLUE SHIELD | Admitting: Urology

## 2015-05-01 ENCOUNTER — Ambulatory Visit: Payer: BLUE CROSS/BLUE SHIELD | Admitting: *Deleted

## 2015-05-01 DIAGNOSIS — M5442 Lumbago with sciatica, left side: Secondary | ICD-10-CM | POA: Diagnosis not present

## 2015-05-01 DIAGNOSIS — M5416 Radiculopathy, lumbar region: Secondary | ICD-10-CM

## 2015-05-01 NOTE — Therapy (Signed)
[facility] [place], [place], [address] Phone: [phone]   Fax:  [phone]  Physical Therapy Treatment  Patient Details  Name: Cameron Noble MRN: [id number] Date of Birth: [birth year] Referring Provider: Kenn File MD.  Encounter Date: 05/01/2015      PT End of Session - 05/01/15 0832    Visit Number 4   Number of Visits 12   Date for PT Re-Evaluation 05/20/15   PT Start Time 0830   PT Stop Time 0923   PT Time Calculation (min) 53 min      Past Medical History  Diagnosis Date  . Allergic rhinitis   . DDD (degenerative disc disease), cervical   . Diverticulosis   . Diabetes mellitus   . Hypertension   . Hyperlipidemia   . URI (upper respiratory infection)     Past Surgical History  Procedure Laterality Date  . Back surgery  03/2002    Dr. Trenton Gammon   . Ddd c-spine repair    . Cystoscopy with stent placement Left 01/27/2015    Procedure: CYSTOSCOPY, RETROGRADE, WITH LEFT STENT PLACEMENT;  Surgeon: Irine Seal, MD;  Location: [facility];  Service: Urology;  Laterality: Left;  I have 2 cases at [facility] and a foley to place at [facility].  I would guess it will be 830-9 before I get up to [facility].   Marland Kitchen Knee arthroscopy Right     There were no vitals filed for this visit.      Subjective Assessment - 05/01/15 0921    Subjective 15 mins late. Doing a little better. My job is what flares it up   Limitations Sitting;Lifting   Currently in Pain? Yes   Pain Score 6    Pain Location Back   Pain Orientation Left;Right;Lower   Pain Descriptors / Indicators Aching;Burning;Sharp   Pain Type Acute pain   Pain Onset 1 to 4 weeks ago   Pain Frequency Constant                         [facility] Adult PT Treatment/Exercise - 05/01/15 0001    Therapeutic Activites    Therapeutic Activities ADL's;Lifting   ADL's Power position and kneeling   Lifting work positions   Sales executive Stimulation;Moist  Heat;Traction   Moist Heat Therapy   Number Minutes Moist Heat 15 Minutes   Moist Heat Location Lumbar Spine   Electrical Stimulation   Electrical Stimulation Location LB paras premod x15 mins 80-150 hz hooklying   Electrical Stimulation Goals Pain   Traction   Type of Traction Lumbar   Min (lbs) 5   Max (lbs) 78   Hold Time 99   Rest Time 5   Time 15   Manual Therapy   Manual Therapy Myofascial release;Soft tissue mobilization   Soft tissue mobilization STW/ IASTM to Bil LB paras and into SIJ with Pt LT side lying                     PT Long Term Goals - 05/01/15 3817    PT LONG TERM GOAL #1   Title Ind with an HEP.   Time 6   Period Weeks   Status Achieved   PT LONG TERM GOAL #2   Title Perform ADL's with pain not > 3/10.   Time 6   Period Weeks   Status On-going   PT LONG TERM GOAL #3   Title Eliminate left left  pain   Time 6   Period Weeks   Status On-going               Plan - 05/01/15 6578    Clinical Impression Statement Pt was 15 mins late. Pt did great with Rx today. He had less notable tightness in LB musculature today and did well with Pelvic Traction at 40% BW. 79#s . Pt felt good after Traction and had less pain after Rx. No new goals met today due to Pain with ADL's  and  pain still in LE.   Rehab Potential Excellent   PT Frequency 2x / week   PT Duration 6 weeks   PT Treatment/Interventions ADLs/Self Care Home Management;Electrical Stimulation;Moist Heat;Therapeutic exercise;Therapeutic activities;Ultrasound;Traction;Patient/family education;Manual techniques   PT Next Visit Plan Core exercises; Modalities PRN and could try int traction at 40% body weight with radiculopathy persists.  review HEP   Consulted and Agree with Plan of Care Patient      Patient will benefit from skilled therapeutic intervention in order to improve the following deficits and impairments:  Pain, Decreased activity tolerance  Visit  Diagnosis: Radiculopathy, lumbar region  Left-sided low back pain with left-sided sciatica     Problem List Patient Active Problem List   Diagnosis Date Noted  . Sciatica of left side 03/31/2015  . Chronic back pain 03/31/2015  . Hydronephrosis with urinary obstruction due to ureteral calculus 01/27/2015  . AKI (acute kidney injury) ([place]) 01/27/2015  . BMI 29.0-29.9,adult 08/12/2014  . Essential hypertension, benign 05/04/2012  . Hyperlipidemia with target LDL less than 100 05/04/2012  . Diabetes ([place]) 05/04/2012    RAMSEUR,CHRIS, PTA 05/01/2015, 10:29 AM  [facility] [place], [place], [address] Phone: [phone]   Fax:  [phone]  Name: Cameron Noble MRN: [id number] Date of Birth: Jul 13, 1960

## 2015-05-05 DIAGNOSIS — E119 Type 2 diabetes mellitus without complications: Secondary | ICD-10-CM | POA: Diagnosis not present

## 2015-05-05 DIAGNOSIS — I10 Essential (primary) hypertension: Secondary | ICD-10-CM | POA: Diagnosis not present

## 2015-05-05 DIAGNOSIS — Z008 Encounter for other general examination: Secondary | ICD-10-CM | POA: Diagnosis not present

## 2015-05-05 DIAGNOSIS — E785 Hyperlipidemia, unspecified: Secondary | ICD-10-CM | POA: Diagnosis not present

## 2015-05-06 ENCOUNTER — Ambulatory Visit: Payer: BLUE CROSS/BLUE SHIELD | Admitting: *Deleted

## 2015-05-06 DIAGNOSIS — M5442 Lumbago with sciatica, left side: Secondary | ICD-10-CM | POA: Diagnosis not present

## 2015-05-06 DIAGNOSIS — M5416 Radiculopathy, lumbar region: Secondary | ICD-10-CM | POA: Diagnosis not present

## 2015-05-06 NOTE — Therapy (Signed)
[facility] [place], [place], [address] Phone: [phone]   Fax:  [phone]  Physical Therapy Treatment  Patient Details  Name: Cameron Noble MRN: [id number] Date of Birth: 1960/12/23 Referring Provider: Kenn File MD.  Encounter Date: 05/06/2015      PT End of Session - 05/06/15 1123    Visit Number 5   Number of Visits 12   Date for PT Re-Evaluation 05/20/15   PT Start Time 1115   PT Stop Time 1208   PT Time Calculation (min) 53 min      Past Medical History  Diagnosis Date  . Allergic rhinitis   . DDD (degenerative disc disease), cervical   . Diverticulosis   . Diabetes mellitus   . Hypertension   . Hyperlipidemia   . URI (upper respiratory infection)     Past Surgical History  Procedure Laterality Date  . Back surgery  03/2002    Dr. Trenton Gammon   . Ddd c-spine repair    . Cystoscopy with stent placement Left 01/27/2015    Procedure: CYSTOSCOPY, RETROGRADE, WITH LEFT STENT PLACEMENT;  Surgeon: Irine Seal, MD;  Location: [facility];  Service: Urology;  Laterality: Left;  I have 2 cases at [facility] and a foley to place at [facility].  I would guess it will be 830-9 before I get up to [facility].   Marland Kitchen Knee arthroscopy Right     There were no vitals filed for this visit.      Subjective Assessment - 05/06/15 1122    Subjective Doing better. Did good after Rx . Did good with Traction   Limitations Sitting;Lifting   Patient Stated Goals Get out of pain.   Currently in Pain? Yes   Pain Score 4    Pain Location Back   Pain Orientation Left;Right   Pain Descriptors / Indicators Aching;Burning;Sharp   Pain Type Acute pain   Pain Onset 1 to 4 weeks ago                         [facility] Adult PT Treatment/Exercise - 05/06/15 0001    Exercises   Exercises Lumbar   Lumbar Exercises: Supine   Ab Set 5 seconds;20 reps  Focus on Drawin Core Activation   Bent Knee Raise 20 reps;5 seconds  Marching   Bridge 20 reps;5  seconds   Straight Leg Raise 20 reps;3 seconds   Lumbar Exercises: Prone   Straight Leg Raise 20 reps;3 seconds;15 reps;5 reps  with Drawin  4x 10 add arms next Rx   Modalities   Modalities Electrical Stimulation;Moist Heat;Traction   Traction   Type of Traction Lumbar   Min (lbs) 5   Max (lbs) 85   Hold Time 99   Rest Time 5   Time 15   Manual Therapy   Manual Therapy Myofascial release;Soft tissue mobilization   Soft tissue mobilization STW/ IASTM to Bil LB paras and into SIJ with Pt LT side lying                     PT Long Term Goals - 05/01/15 [id number]    PT LONG TERM GOAL #1   Title Ind with an HEP.   Time 6   Period Weeks   Status Achieved   PT LONG TERM GOAL #2   Title Perform ADL's with pain not > 3/10.   Time 6   Period Weeks   Status On-going   PT  LONG TERM GOAL #3   Title Eliminate left left pain   Time 6   Period Weeks   Status On-going               Plan - 05/06/15 1208    Clinical Impression Statement Pt did fairly well today with Rx. He was able to increase reps with exs today and did well with Pelvic Traction at 85#s today. He had notable tightness in LT QL and paras, but less soreness   Rehab Potential Excellent   PT Frequency 2x / week   PT Duration 6 weeks   PT Treatment/Interventions ADLs/Self Care Home Management;Electrical Stimulation;Moist Heat;Therapeutic exercise;Therapeutic activities;Ultrasound;Traction;Patient/family education;Manual techniques   PT Next Visit Plan Core exercises; Modalities PRN and Pelvic Tractiont .  review HEP   Consulted and Agree with Plan of Care Patient      Patient will benefit from skilled therapeutic intervention in order to improve the following deficits and impairments:  Pain, Decreased activity tolerance  Visit Diagnosis: Radiculopathy, lumbar region  Left-sided low back pain with left-sided sciatica     Problem List Patient Active Problem List   Diagnosis Date Noted  . Sciatica  of left side 03/31/2015  . Chronic back pain 03/31/2015  . Hydronephrosis with urinary obstruction due to ureteral calculus 01/27/2015  . AKI (acute kidney injury) ([place]) 01/27/2015  . BMI 29.0-29.9,adult 08/12/2014  . Essential hypertension, benign 05/04/2012  . Hyperlipidemia with target LDL less than 100 05/04/2012  . Diabetes ([place]) 05/04/2012    Cameron Noble,CHRIS, PTA 05/06/2015, 1:09 PM  [facility] [place], [place], [address] Phone: [phone]   Fax:  [phone]  Name: Cameron Noble MRN: [id number] Date of Birth: 30-Oct-1960

## 2015-05-09 ENCOUNTER — Encounter (HOSPITAL_COMMUNITY): Payer: Self-pay | Admitting: General Practice

## 2015-05-09 ENCOUNTER — Other Ambulatory Visit: Payer: Self-pay | Admitting: Urology

## 2015-05-09 DIAGNOSIS — N200 Calculus of kidney: Secondary | ICD-10-CM | POA: Diagnosis not present

## 2015-05-14 NOTE — H&P (Signed)
Active Problems  1. History of acute pyelonephritis (Z87.440)  2. Left nephrolithiasis (N20.0)  3. Lumbago (M54.5)  4. Pyuria (N39.0)  History of Present Illness  Cameron Noble returns today in f/u.  He had sepsis with an obstructing right proximal stone that was treated with stenting and subsequent ESWL on 02/06/15.  He had some flank pain earlier this month but it was felt to musculoskeletal.  That pain has resolved.  A KUB today shows a 6x8 mm shadow at about L2 consistent with the course of his ureter.  he has some pyuria today but no symptoms. Marland Kitchen He was placed on indapamide for his stones.   Past Medical History  1. History of Calculus of left ureter (N20.1)  2. History of acute pyelonephritis (Z87.440)  3. History of diabetes mellitus (Z86.39)  4. History of hypertension (Z86.79)  5. History of renal calculi [id number])  Surgical History  1. History of Back Surgery  2. History of Knee Surgery  3. History of Renal Lithotripsy  Current Meds  1. Fluticasone Propionate 50 MCG/ACT Nasal Suspension;  Therapy: (Recorded:27Jan2017) to Recorded  2. GlipiZIDE 5 MG Oral Tablet;  Therapy: (Recorded:27Jan2017) to Recorded  3. Indapamide 2.5 MG Oral Tablet; take 1 tablet by mouth once daily;  Therapy: [id number] to (Evaluate:03Mar2018)  Requested for: [id number]; Last  Rx:08Mar2017 Ordered  4. Lipitor 10 MG Oral Tablet;  Therapy: ([id number]) to Recorded  5. Lisinopril TABS;  Therapy: (Recorded:17Feb2017) to Recorded  6. Meloxicam 7.5 MG Oral Tablet; 1-2 po q day prn pain;  Therapy: [id number] to (Last Rx:17Mar2017)  Requested for: [id number] Ordered  7. Sulfamethoxazole-Trimethoprim 800-160 MG Oral Tablet; TAKE 1 TABLET TWICE DAILY;  Therapy: 21Feb2017 to (Evaluate:28Feb2017)  Requested for: 22Feb2017; Last  Rx:21Feb2017 Ordered  Allergies  1. No Known Drug Allergies  Family History  1. Family history of Deceased : Mother, Father  2. Family history of lung cancer (Z80.1) : Father  3.  Family history of malignant neoplasm (Z80.9) : Mother  Social History  1. Caffeine use (F15.90)  2. Does not use tobacco (Z78.9)  3. Married  4. Number of children   2 sons  56. Occupation   Charity fundraiser  Review of Systems  Genitourinary: no hematuria.  Gastrointestinal: no flank pain.  Constitutional: no fever.    Vitals Vital Signs [Data Includes: Last 1 Day]  Recorded: [id number] 10:40AM  Blood Pressure: 110 / 66 Temperature: 98.7 F Heart Rate: 73  Results/Data Urine [Data Includes: Last 1 Day]   [id number]  COLOR YELLOW   APPEARANCE CLEAR   SPECIFIC GRAVITY 1.020   pH 7.0   GLUCOSE NEGATIVE   BILIRUBIN NEGATIVE   KETONE NEGATIVE   BLOOD NEGATIVE   PROTEIN NEGATIVE   NITRITE NEGATIVE   LEUKOCYTE ESTERASE TRACE   SQUAMOUS EPITHELIAL/HPF NONE SEEN HPF  WBC 6-10 WBC/HPF  RBC NONE SEEN RBC/HPF  BACTERIA MODERATE HPF  CRYSTALS NONE SEEN HPF  CASTS NONE SEEN LPF  Yeast NONE SEEN HPF   The following images/tracing/specimen were independently visualized:  KUB films and report reviewed.    Assessment  1. Left nephrolithiasis (N20.0)  2. Pyuria (N39.0)     He has a possible stone in the left proximal ureter and some pyuria but no symptmos.   Plan Left nephrolithiasis   1. CT-ABD/PELVIS W/O CONTRAST; Status:Hold For - Appointment,PreCert,Date of  Service,Print; Requested [id number];   2. [facility]  Follow-up  Status: Hold For - Appointment,Date of Service   Requested for: [phone] PMH: Calculus of left ureter  3. KUB; Status:Hold For - Date of Service; Requested for:31Mar2017;   4. UA With REFLEX; [Do Not Release]; Status:Hold For - [employer];  Requested for:17Mar2017;  Pyuria   5. UA With REFLEX; [Do Not Release]; Status:Hold For - [employer];  Requested for:29Mar2017;   6. URINE CULTURE; Status:Hold For - Specimen/Data Collection,Appointment; Requested  for:31Mar2017;    I am going to have him return in 2-3 weeks  for a CT to assess the stone and if it hasn't progressed I will consider retreatment. Urine culture today.   Discussion/Summary   CC: Shelah Lewandowsky NP.     Signatures Electronically signed by : Irine Seal, M.D.; Apr 11 2015 11:25AM EST

## 2015-05-15 ENCOUNTER — Encounter (HOSPITAL_COMMUNITY)
Admission: RE | Disposition: A | Discharge status: Home / Self Care | Payer: Self-pay | Source: Ambulatory Visit | Attending: Urology

## 2015-05-15 ENCOUNTER — Encounter (HOSPITAL_COMMUNITY): Payer: Self-pay | Admitting: *Deleted

## 2015-05-15 ENCOUNTER — Encounter: Payer: BLUE CROSS/BLUE SHIELD | Admitting: *Deleted

## 2015-05-15 ENCOUNTER — Ambulatory Visit (HOSPITAL_COMMUNITY): Payer: BLUE CROSS/BLUE SHIELD

## 2015-05-15 ENCOUNTER — Ambulatory Visit (HOSPITAL_COMMUNITY)
Admission: RE | Admit: 2015-05-15 | Discharge: 2015-05-15 | Disposition: A | Discharge status: Home / Self Care | Payer: BLUE CROSS/BLUE SHIELD | Source: Ambulatory Visit | Attending: Urology | Admitting: Urology

## 2015-05-15 DIAGNOSIS — N200 Calculus of kidney: Secondary | ICD-10-CM | POA: Diagnosis not present

## 2015-05-15 DIAGNOSIS — Z79899 Other long term (current) drug therapy: Secondary | ICD-10-CM | POA: Diagnosis not present

## 2015-05-15 DIAGNOSIS — E119 Type 2 diabetes mellitus without complications: Secondary | ICD-10-CM | POA: Insufficient documentation

## 2015-05-15 DIAGNOSIS — I10 Essential (primary) hypertension: Secondary | ICD-10-CM | POA: Diagnosis not present

## 2015-05-15 DIAGNOSIS — Z87442 Personal history of urinary calculi: Secondary | ICD-10-CM | POA: Insufficient documentation

## 2015-05-15 DIAGNOSIS — Z7984 Long term (current) use of oral hypoglycemic drugs: Secondary | ICD-10-CM | POA: Diagnosis not present

## 2015-05-15 HISTORY — DX: Other chronic pain: G89.29

## 2015-05-15 HISTORY — DX: Chronic kidney disease, unspecified: N18.9

## 2015-05-15 HISTORY — DX: Urinary tract infection, site not specified: N39.0

## 2015-05-15 HISTORY — DX: Dorsalgia, unspecified: M54.9

## 2015-05-15 LAB — GLUCOSE, CAPILLARY
GLUCOSE-CAPILLARY: 92 mg/dL (ref 65–99)
GLUCOSE-CAPILLARY: 185 mg/dL — AB (ref 65–99)
Glucose-Capillary: 67 mg/dL (ref 65–99)

## 2015-05-15 SURGERY — LITHOTRIPSY, ESWL
Anesthesia: LOCAL | Laterality: Left

## 2015-05-15 MED ORDER — DEXTROSE-NACL 5-0.45 % IV SOLN: INTRAVENOUS | Status: DC

## 2015-05-15 MED ORDER — DIPHENHYDRAMINE HCL 25 MG PO CAPS
25.0000 mg | ORAL_CAPSULE | ORAL | Status: AC
  Administered 2015-05-15: 25 mg via ORAL
  Filled 2015-05-15: qty 1

## 2015-05-15 MED ORDER — SODIUM CHLORIDE 0.9 % IV SOLN: INTRAVENOUS | Status: DC

## 2015-05-15 MED ORDER — CIPROFLOXACIN HCL 500 MG PO TABS
500.0000 mg | ORAL_TABLET | ORAL | Status: AC
  Administered 2015-05-15: 500 mg via ORAL
  Filled 2015-05-15: qty 1

## 2015-05-15 MED ORDER — DIAZEPAM 5 MG PO TABS
10.0000 mg | ORAL_TABLET | ORAL | Status: AC
  Administered 2015-05-15: 10 mg via ORAL
  Filled 2015-05-15: qty 2

## 2015-05-15 NOTE — Interval H&P Note (Signed)
History and Physical Interval Note:  05/15/2015 7:29 AM  Cameron Noble  has presented today for surgery, with the diagnosis of LEFT PROXIMAL STONE  The various methods of treatment have been discussed with the patient and family. After consideration of risks, benefits and other options for treatment, the patient has consented to  Procedure(s): LEFT EXTRACORPOREAL SHOCK WAVE LITHOTRIPSY (ESWL) (Left) as a surgical intervention .  The patient's history has been reviewed, patient examined, no change in status, stable for surgery.  I have reviewed the patient's chart and labs.  Questions were answered to the patient's satisfaction.     Cameron Noble

## 2015-05-15 NOTE — Discharge Instructions (Addendum)
Dietary Guidelines to Help Prevent Kidney Stones Your risk of kidney stones can be decreased by adjusting the foods you eat. The most important thing you can do is drink enough fluid. You should drink enough fluid to keep your urine clear or pale yellow. The following guidelines provide specific information for the type of kidney stone you have had. GUIDELINES ACCORDING TO TYPE OF KIDNEY STONE Calcium Oxalate Kidney Stones  Reduce the amount of salt you eat. Foods that have a lot of salt cause your body to release excess calcium into your urine. The excess calcium can combine with a substance called oxalate to form kidney stones.  Reduce the amount of animal protein you eat if the amount you eat is excessive. Animal protein causes your body to release excess calcium into your urine. Ask your dietitian how much protein from animal sources you should be eating.  Avoid foods that are high in oxalates. If you take vitamins, they should have less than 500 mg of vitamin C. Your body turns vitamin C into oxalates. You do not need to avoid fruits and vegetables high in vitamin C. Calcium Phosphate Kidney Stones  Reduce the amount of salt you eat to help prevent the release of excess calcium into your urine.  Reduce the amount of animal protein you eat if the amount you eat is excessive. Animal protein causes your body to release excess calcium into your urine. Ask your dietitian how much protein from animal sources you should be eating.  Get enough calcium from food or take a calcium supplement (ask your dietitian for recommendations). Food sources of calcium that do not increase your risk of kidney stones include:  Broccoli.  Dairy products, such as cheese and yogurt.  Pudding. Uric Acid Kidney Stones  Do not have more than 6 oz of animal protein per day. FOOD SOURCES Animal Protein Sources  Meat (all types).  Poultry.  Eggs.  Fish, seafood. Foods High in [employer] seasonings.  Soy  sauce.  Teriyaki sauce.  Cured and processed meats.  Salted crackers and snack foods.  Fast food.  Canned soups and most canned foods. Foods High in Oxalates  Grains:  Amaranth.  Barley.  Grits.  Wheat germ.  Bran.  Buckwheat flour.  All bran cereals.  Pretzels.  Whole wheat bread.  Vegetables:  Beans (wax).  Beets and beet greens.  Collard greens.  Eggplant.  [place]ole.  Leeks.  Okra.  Parsley.  Rutabagas.  Spinach.  Swiss chard.  Tomato paste.  Fried potatoes.  Sweet potatoes.  Fruits:  Red currants.  Figs.  Kiwi.  Rhubarb.  Meat and Other Protein Sources:  Beans (dried).  Soy burgers and other soybean products.  Miso.  Nuts (peanuts, almonds, pecans, cashews, hazelnuts).  Nut butters.  Sesame seeds and tahini (paste made of sesame seeds).  Poppy seeds.  Beverages:  Chocolate drink mixes.  Soy milk.  Instant iced tea.  Juices made from high-oxalate fruits or vegetables.  Other:  Carob.  Chocolate.  Fruitcake.  Marmalades.   This information is not intended to replace advice given to you by your health care provider. Make sure you discuss any questions you have with your health care provider.   Document Released: 04/24/2010 Document Revised: 01/02/2013 Document Reviewed: 11/24/2012 Elsevier Interactive Patient Education 2016 Elsevier Inc  .Moderate Conscious Sedation, Adult, Care After Refer to this sheet in the next few weeks. These instructions provide you with information on caring for yourself after your procedure. Your health care  provider may also give you more specific instructions. Your treatment has been planned according to current medical practices, but problems sometimes occur. Call your health care provider if you have any problems or questions after your procedure. WHAT TO EXPECT AFTER THE PROCEDURE  After your procedure:  You may feel sleepy, clumsy, and have poor balance for several  hours.  Vomiting may occur if you eat too soon after the procedure. HOME CARE INSTRUCTIONS  Do not participate in any activities where you could become injured for at least 24 hours. Do not:  Drive.  Swim.  Ride a bicycle.  Operate heavy machinery.  Cook.  Use power tools.  Climb ladders.  Work from a high place.  Do not make important decisions or sign legal documents until you are improved.  If you vomit, drink water, juice, or soup when you can drink without vomiting. Make sure you have little or no nausea before eating solid foods.  Only take over-the-counter or prescription medicines for pain, discomfort, or fever as directed by your health care provider.  Make sure you and your family fully understand everything about the medicines given to you, including what side effects may occur.  You should not drink alcohol, take sleeping pills, or take medicines that cause drowsiness for at least 24 hours.  If you smoke, do not smoke without supervision.  If you are feeling better, you may resume normal activities 24 hours after you were sedated.  Keep all appointments with your health care provider. SEEK MEDICAL CARE IF:  Your skin is pale or bluish in color.  You continue to feel nauseous or vomit.  Your pain is getting worse and is not helped by medicine.  You have bleeding or swelling.  You are still sleepy or feeling clumsy after 24 hours. SEEK IMMEDIATE MEDICAL CARE IF:  You develop a rash.  You have difficulty breathing.  You develop any type of allergic problem.  You have a fever. MAKE SURE YOU:  Understand these instructions.  Will watch your condition.  Will get help right away if you are not doing well or get worse.   This information is not intended to replace advice given to you by your health care provider. Make sure you discuss any questions you have with your health care provider.   Document Released: 10/18/2012 Document Revised:  01/18/2014 Document Reviewed: 10/18/2012 Elsevier Interactive Patient Education [employer].

## 2015-05-15 NOTE — Progress Notes (Signed)
Pt states he took his Glucotrol XL 5mg  at 5am.  D51/5NS started IV prior to KUB.

## 2015-05-19 ENCOUNTER — Encounter: Payer: Self-pay | Admitting: Physical Therapy

## 2015-05-19 ENCOUNTER — Ambulatory Visit: Payer: BLUE CROSS/BLUE SHIELD | Attending: Family Medicine | Admitting: Physical Therapy

## 2015-05-19 DIAGNOSIS — M5416 Radiculopathy, lumbar region: Secondary | ICD-10-CM | POA: Diagnosis not present

## 2015-05-19 DIAGNOSIS — M5442 Lumbago with sciatica, left side: Secondary | ICD-10-CM | POA: Diagnosis not present

## 2015-05-19 NOTE — Therapy (Signed)
[facility] [place], [place], [address] Phone: [phone]   Fax:  [phone]  Physical Therapy Treatment  Patient Details  Name: Cameron Noble MRN: [id number] Date of Birth: 1960/01/22 Referring Provider: Kenn File MD.  Encounter Date: 05/19/2015      PT End of Session - 05/19/15 0900    Visit Number 6   Number of Visits 24   Date for PT Re-Evaluation 07/01/15   PT Start Time 0900   PT Stop Time 0943   PT Time Calculation (min) 43 min   Activity Tolerance Patient tolerated treatment well   Behavior During Therapy [facility] for tasks assessed/performed      Past Medical History  Diagnosis Date  . Allergic rhinitis   . DDD (degenerative disc disease), cervical   . Diverticulosis   . Diabetes mellitus   . Hypertension   . Hyperlipidemia   . URI (upper respiratory infection)   . Chronic back pain   . kidney stones   . UTI (lower urinary tract infection) 2017    Past Surgical History  Procedure Laterality Date  . Back surgery  03/2002    Dr. Trenton Gammon   . Ddd c-spine repair    . Cystoscopy with stent placement Left 01/27/2015    Procedure: CYSTOSCOPY, RETROGRADE, WITH LEFT STENT PLACEMENT;  Surgeon: Irine Seal, MD;  Location: [facility];  Service: Urology;  Laterality: Left;  I have 2 cases at [facility] and a foley to place at [facility].  I would guess it will be 830-9 before I get up to [facility].   Marland Kitchen Knee arthroscopy Right     There were no vitals filed for this visit.      Subjective Assessment - 05/19/15 0858    Subjective Reports pain radiating to just inferior to L buttocks. Worked last night and he notices that that increased pain due to lots of bending and heavy lifting.   Limitations Sitting;Lifting   Patient Stated Goals Get out of pain.   Currently in Pain? Yes   Pain Score 5    Pain Location Back   Pain Orientation Left   Pain Descriptors / Indicators Burning   Pain Type Acute pain   Pain Onset 1 to 4 weeks ago             [facility] PT Assessment - 05/19/15 0001    Assessment   Medical Diagnosis Sciatica of left side.   Next MD Visit None scheduled- After PT   Precautions   Precautions None                     [facility] Adult PT Treatment/Exercise - 05/19/15 0001    Lumbar Exercises: Supine   Ab Set 20 reps;5 seconds   Clam 20 reps   Heel Slides 20 reps   Bent Knee Raise 20 reps;5 seconds   Bridge 20 reps;5 seconds   Straight Leg Raise 20 reps;3 seconds   Lumbar Exercises: Prone   Straight Leg Raise 20 reps   Opposite Arm/Leg Raise Right arm/Left leg;Left arm/Right leg;20 reps  7/10 pain with LUE/RLE; 4-5/10 pain with RUE/LLE   Other Prone Lumbar Exercises Prone ankle squeeze x20 reps with 3 sec hold   Modalities   Modalities Traction   Traction   Type of Traction Lumbar   Min (lbs) 5   Max (lbs) 85   Hold Time 99   Rest Time 5   Time 15  PT Long Term Goals - 05/01/15 [id number]    PT LONG TERM GOAL #1   Title Ind with an HEP.   Time 6   Period Weeks   Status Achieved   PT LONG TERM GOAL #2   Title Perform ADL's with pain not > 3/10.   Time 6   Period Weeks   Status On-going   PT LONG TERM GOAL #3   Title Eliminate left left pain   Time 6   Period Weeks   Status On-going               Plan - 05/19/15 0933    Clinical Impression Statement Patient tolerated today's treatment fairly well with minimal LBP to 7/10 pain with prone LUE/RLE exercises per patient reports throughout therapeutic exercise. Patient completed all exercises well with minimal to moderate multimodal cueing for core activation and proper exercise technique. Lumbar traction was maintained at 85# max weight today with normal response noted following end of treatment. LT goals remain on-going as patient continues to have pain and LE symptoms. Experienced 4/10 LBP upon end of treatment today.   Rehab Potential Excellent   PT Frequency 2x / week   PT Duration 6 weeks    PT Treatment/Interventions ADLs/Self Care Home Management;Electrical Stimulation;Moist Heat;Therapeutic exercise;Therapeutic activities;Ultrasound;Traction;Patient/family education;Manual techniques   PT Next Visit Plan Continue with core exercises and modalities PRN for pain.   Consulted and Agree with Plan of Care Patient      Patient will benefit from skilled therapeutic intervention in order to improve the following deficits and impairments:  Pain, Decreased activity tolerance  Visit Diagnosis: Radiculopathy, lumbar region - Plan: PT plan of care cert/re-cert     Problem List Patient Active Problem List   Diagnosis Date Noted  . Sciatica of left side 03/31/2015  . Chronic back pain 03/31/2015  . Hydronephrosis with urinary obstruction due to ureteral calculus 01/27/2015  . AKI (acute kidney injury) ([place]) 01/27/2015  . BMI 29.0-29.9,adult 08/12/2014  . Essential hypertension, benign 05/04/2012  . Hyperlipidemia with target LDL less than 100 05/04/2012  . Diabetes ([employer]) 05/04/2012    APPLEGATE, [place], PTA 05/19/2015, 4:49 PM Mali Applegate MPT [facility] [address] [place], [place], [address] Phone: [phone]   Fax:  [phone]  Name: Cameron Noble MRN: [id number] Date of Birth: May 28, 1960

## 2015-05-28 ENCOUNTER — Encounter: Payer: Self-pay | Admitting: Physical Therapy

## 2015-05-28 ENCOUNTER — Ambulatory Visit: Payer: BLUE CROSS/BLUE SHIELD | Admitting: Physical Therapy

## 2015-05-28 DIAGNOSIS — M5442 Lumbago with sciatica, left side: Secondary | ICD-10-CM | POA: Diagnosis not present

## 2015-05-28 DIAGNOSIS — M5416 Radiculopathy, lumbar region: Secondary | ICD-10-CM

## 2015-05-28 NOTE — Therapy (Signed)
[facility] [place], [place], [address] Phone: [phone]   Fax:  [phone]  Physical Therapy Treatment  Patient Details  Name: Cameron Noble MRN: [id number] Date of Birth: February 14, 1960 Referring Provider: Kenn File MD.  Encounter Date: 05/28/2015      PT End of Session - 05/28/15 0902    Visit Number 7   Number of Visits 24   Date for PT Re-Evaluation 07/01/15   PT Start Time 0902   PT Stop Time 0948   PT Time Calculation (min) 46 min   Activity Tolerance Patient tolerated treatment well   Behavior During Therapy [facility] for tasks assessed/performed      Past Medical History  Diagnosis Date  . Allergic rhinitis   . DDD (degenerative disc disease), cervical   . Diverticulosis   . Diabetes mellitus   . Hypertension   . Hyperlipidemia   . URI (upper respiratory infection)   . Chronic back pain   . kidney stones   . UTI (lower urinary tract infection) 2017    Past Surgical History  Procedure Laterality Date  . Back surgery  03/2002    Dr. Trenton Gammon   . Ddd c-spine repair    . Cystoscopy with stent placement Left 01/27/2015    Procedure: CYSTOSCOPY, RETROGRADE, WITH LEFT STENT PLACEMENT;  Surgeon: Irine Seal, MD;  Location: [facility];  Service: Urology;  Laterality: Left;  I have 2 cases at [facility] and a foley to place at [facility].  I would guess it will be 830-9 before I get up to [facility].   Marland Kitchen Knee arthroscopy Right     There were no vitals filed for this visit.      Subjective Assessment - 05/28/15 0902    Subjective Reports pain is down to L calf area and had a rough day at work yesterday.   Limitations Sitting;Lifting   Patient Stated Goals Get out of pain.   Currently in Pain? Yes   Pain Score 5    Pain Location Leg   Pain Orientation Left   Pain Descriptors / Indicators Aching   Pain Type Acute pain   Pain Onset 1 to 4 weeks ago            [facility] PT Assessment - 05/28/15 0001    Assessment   Medical  Diagnosis Sciatica of left side.   Next MD Visit None scheduled- After PT   Precautions   Precautions None                     [facility] Adult PT Treatment/Exercise - 05/28/15 0001    Lumbar Exercises: Supine   Ab Set 20 reps;5 seconds   Clam 20 reps   Heel Slides 20 reps  BLE; 3/10 pain with L heel slide   Bent Knee Raise 20 reps;5 seconds  2/10 pain with LLE marching   Bridge 20 reps  2/10 LBP   Straight Leg Raise 20 reps;3 seconds  BLE; 3/10 LLE SLR   Lumbar Exercises: Prone   Straight Leg Raise 20 reps  4-5/10 LLE SLR, 2-3/10 RLE SLR   Other Prone Lumbar Exercises POE x2 min, Prone ankle squeeze 5 sec x15 reps   Other Prone Lumbar Exercises Prone pressups x10 reps   Modalities   Modalities Traction   Traction   Type of Traction Lumbar   Min (lbs) 5   Max (lbs) 90   Hold Time 99   Rest Time 5   Time 15  PT Long Term Goals - 05/01/15 [id number]    PT LONG TERM GOAL #1   Title Ind with an HEP.   Time 6   Period Weeks   Status Achieved   PT LONG TERM GOAL #2   Title Perform ADL's with pain not > 3/10.   Time 6   Period Weeks   Status On-going   PT LONG TERM GOAL #3   Title Eliminate left left pain   Time 6   Period Weeks   Status On-going               Plan - 05/28/15 0934    Clinical Impression Statement Patient tolerated today's treatment fairly well although he arrived at treatment with mid grade LBP. Patient continued to experience symptoms and discomfort in LBP and LLE especially with exercises today. Minimal to moderate multimodal cues were required for proper exercise technique and core activation. MPT was advised regarding continued pain and directed increase of max weight of traction to 90#. Patient reports having to complete heavy lifting repeatedly at work ranging from 10-15#. LT goals remain on-going due to continued pain and LE symptoms at this time. Reports "some" continued LBP upon end of treatment.    Rehab Potential Excellent   PT Frequency 2x / week   PT Duration 6 weeks   PT Treatment/Interventions ADLs/Self Care Home Management;Electrical Stimulation;Moist Heat;Therapeutic exercise;Therapeutic activities;Ultrasound;Traction;Patient/family education;Manual techniques   PT Next Visit Plan Continue with core exercises and modalities PRN for pain.   Consulted and Agree with Plan of Care Patient      Patient will benefit from skilled therapeutic intervention in order to improve the following deficits and impairments:  Pain, Decreased activity tolerance  Visit Diagnosis: Radiculopathy, lumbar region  Left-sided low back pain with left-sided sciatica     Problem List Patient Active Problem List   Diagnosis Date Noted  . Sciatica of left side 03/31/2015  . Chronic back pain 03/31/2015  . Hydronephrosis with urinary obstruction due to ureteral calculus 01/27/2015  . AKI (acute kidney injury) ([place]) 01/27/2015  . BMI 29.0-29.9,adult 08/12/2014  . Essential hypertension, benign 05/04/2012  . Hyperlipidemia with target LDL less than 100 05/04/2012  . Diabetes ([place]) 05/04/2012    Wynelle Fanny, PTA 05/28/2015, 9:51 AM  [facility] [address] [place], [place], [address] Phone: [phone]   Fax:  [phone]  Name: Cameron Noble MRN: [id number] Date of Birth: 01-26-1960

## 2015-06-02 ENCOUNTER — Encounter: Payer: BLUE CROSS/BLUE SHIELD | Admitting: Physical Therapy

## 2015-06-03 ENCOUNTER — Ambulatory Visit: Payer: BLUE CROSS/BLUE SHIELD | Admitting: Physical Therapy

## 2015-06-03 ENCOUNTER — Encounter: Payer: Self-pay | Admitting: Physical Therapy

## 2015-06-03 DIAGNOSIS — M5442 Lumbago with sciatica, left side: Secondary | ICD-10-CM | POA: Diagnosis not present

## 2015-06-03 DIAGNOSIS — M5416 Radiculopathy, lumbar region: Secondary | ICD-10-CM | POA: Diagnosis not present

## 2015-06-03 NOTE — Therapy (Signed)
[facility] [place], [place], [address] Phone: [phone]   Fax:  [phone]  Physical Therapy Treatment  Patient Details  Name: Cameron Noble MRN: [id number] Date of Birth: Mar 23, 1960 Referring Provider: Kenn File MD.  Encounter Date: 06/03/2015      PT End of Session - 06/03/15 0909    Visit Number 8   Number of Visits 24   Date for PT Re-Evaluation 07/01/15   PT Start Time 0903   PT Stop Time 0949   PT Time Calculation (min) 46 min   Activity Tolerance Patient tolerated treatment well   Behavior During Therapy [facility] for tasks assessed/performed      Past Medical History  Diagnosis Date  . Allergic rhinitis   . DDD (degenerative disc disease), cervical   . Diverticulosis   . Diabetes mellitus   . Hypertension   . Hyperlipidemia   . URI (upper respiratory infection)   . Chronic back pain   . kidney stones   . UTI (lower urinary tract infection) 2017    Past Surgical History  Procedure Laterality Date  . Back surgery  03/2002    Dr. Trenton Gammon   . Ddd c-spine repair    . Cystoscopy with stent placement Left 01/27/2015    Procedure: CYSTOSCOPY, RETROGRADE, WITH LEFT STENT PLACEMENT;  Surgeon: Irine Seal, MD;  Location: [facility];  Service: Urology;  Laterality: Left;  I have 2 cases at [facility] and a foley to place at [facility].  I would guess it will be 830-9 before I get up to [facility].   Marland Kitchen Knee arthroscopy Right     There were no vitals filed for this visit.      Subjective Assessment - 06/03/15 0902    Subjective Reports that he had a lot of pain yesterday and walked slowly. Applied moist heat and bengay and worked sunday. Reports that today is much better though. L sided pain is biggest coplaint.   Limitations Sitting;Lifting   Patient Stated Goals Get out of pain.   Currently in Pain? Yes   Pain Score 3    Pain Location Back   Pain Orientation Left;Lower   Pain Descriptors / Indicators Sharp   Pain Type Acute pain    Pain Onset 1 to 4 weeks ago   Pain Frequency Intermittent   Aggravating Factors  Bending, rotation to L, looking over his shoulder.            [facility] PT Assessment - 06/03/15 0001    Assessment   Medical Diagnosis Sciatica of left side.   Next MD Visit None scheduled- After PT   Precautions   Precautions None                     [facility] Adult PT Treatment/Exercise - 06/03/15 0001    Lumbar Exercises: Stretches   Single Knee to Chest Stretch 3 reps;30 seconds;Other (comment)  BLE   Piriformis Stretch 3 reps;30 seconds;Other (comment)  BLE   Lumbar Exercises: Supine   Clam 20 reps   Heel Slides 20 reps  BLE   Bent Knee Raise 20 reps;Other (comment)  BLE; 3/10 pain   Bridge 20 reps  2-3/10 LBP   Straight Leg Raise 20 reps;3 seconds  BLE   Lumbar Exercises: Prone   Other Prone Lumbar Exercises POE x2 min   Other Prone Lumbar Exercises Prone pressups x10 reps   Modalities   Modalities Traction   Traction   Type of Traction Lumbar  Min (lbs) 5   Max (lbs) 90   Hold Time 99   Rest Time 5   Time 15                     PT Long Term Goals - 05/01/15 [id number]    PT LONG TERM GOAL #1   Title Ind with an HEP.   Time 6   Period Weeks   Status Achieved   PT LONG TERM GOAL #2   Title Perform ADL's with pain not > 3/10.   Time 6   Period Weeks   Status On-going   PT LONG TERM GOAL #3   Title Eliminate left left pain   Time 6   Period Weeks   Status On-going               Plan - 06/03/15 0936    Clinical Impression Statement Patient tolerated today's treatment fairly well as he arrived with overall decreased low back discomfort although L sided back pain is still experienced by patient. Completed all exercises with core activation verbal cues and multimodal cueing for proper exercise technique. Patient reported in supine position he could feel sensation in bilateral sides of low back with more in L side. Minimal increased pain with  intermittant exercises today per patient report. Patient reported good effects from previous traction at 90# max weight thus traction was continued at 90#.  Patient did not verablize any adverse sensations following traction.   Rehab Potential Excellent   PT Frequency 2x / week   PT Duration 6 weeks   PT Treatment/Interventions ADLs/Self Care Home Management;Electrical Stimulation;Moist Heat;Therapeutic exercise;Therapeutic activities;Ultrasound;Traction;Patient/family education;Manual techniques   PT Next Visit Plan Continue with core exercises and modalities PRN for pain.   Consulted and Agree with Plan of Care Patient      Patient will benefit from skilled therapeutic intervention in order to improve the following deficits and impairments:  Pain, Decreased activity tolerance  Visit Diagnosis: Radiculopathy, lumbar region  Left-sided low back pain with left-sided sciatica     Problem List Patient Active Problem List   Diagnosis Date Noted  . Sciatica of left side 03/31/2015  . Chronic back pain 03/31/2015  . Hydronephrosis with urinary obstruction due to ureteral calculus 01/27/2015  . AKI (acute kidney injury) ([place]) 01/27/2015  . BMI 29.0-29.9,adult 08/12/2014  . Essential hypertension, benign 05/04/2012  . Hyperlipidemia with target LDL less than 100 05/04/2012  . Diabetes ([place]) 05/04/2012    Wynelle Fanny, PTA 06/03/2015, 9:51 AM  [facility] [address] [place], [place], [address] Phone: [phone]   Fax:  [phone]  Name: Cameron Noble MRN: [id number] Date of Birth: [birth year]

## 2015-06-06 ENCOUNTER — Ambulatory Visit (HOSPITAL_COMMUNITY)
Admission: RE | Admit: 2015-06-06 | Discharge: 2015-06-06 | Disposition: A | Discharge status: Home / Self Care | Payer: BLUE CROSS/BLUE SHIELD | Source: Ambulatory Visit | Attending: Urology | Admitting: Urology

## 2015-06-06 ENCOUNTER — Ambulatory Visit (INDEPENDENT_AMBULATORY_CARE_PROVIDER_SITE_OTHER): Payer: Self-pay | Admitting: Urology

## 2015-06-06 ENCOUNTER — Other Ambulatory Visit: Payer: Self-pay | Admitting: Urology

## 2015-06-06 DIAGNOSIS — N200 Calculus of kidney: Secondary | ICD-10-CM | POA: Diagnosis not present

## 2015-06-06 DIAGNOSIS — M545 Low back pain: Secondary | ICD-10-CM | POA: Diagnosis not present

## 2015-06-06 DIAGNOSIS — N201 Calculus of ureter: Secondary | ICD-10-CM

## 2015-06-12 ENCOUNTER — Encounter: Payer: BLUE CROSS/BLUE SHIELD | Admitting: *Deleted

## 2015-06-16 ENCOUNTER — Other Ambulatory Visit: Payer: Self-pay | Admitting: Urology

## 2015-06-17 ENCOUNTER — Ambulatory Visit: Payer: BLUE CROSS/BLUE SHIELD | Attending: Family Medicine | Admitting: Physical Therapy

## 2015-06-17 ENCOUNTER — Encounter: Payer: Self-pay | Admitting: Physical Therapy

## 2015-06-17 DIAGNOSIS — M5416 Radiculopathy, lumbar region: Secondary | ICD-10-CM | POA: Diagnosis not present

## 2015-06-17 NOTE — Therapy (Signed)
[facility] [place], [place], [address] Phone: [phone]   Fax:  [phone]  Physical Therapy Treatment  Patient Details  Name: Cameron Noble MRN: [id number] Date of Birth: [birth year] Referring Provider: Kenn File MD.  Encounter Date: 06/17/2015      PT End of Session - 06/17/15 3825    Visit Number 9   Number of Visits 24   Date for PT Re-Evaluation 07/01/15   PT Start Time 0822   PT Stop Time 0907   PT Time Calculation (min) 45 min   Activity Tolerance Patient tolerated treatment well   Behavior During Therapy [facility] for tasks assessed/performed      Past Medical History  Diagnosis Date  . Allergic rhinitis   . DDD (degenerative disc disease), cervical   . Diverticulosis   . Diabetes mellitus   . Hypertension   . Hyperlipidemia   . URI (upper respiratory infection)   . Chronic back pain   . kidney stones   . UTI (lower urinary tract infection) 2017    Past Surgical History  Procedure Laterality Date  . Back surgery  03/2002    Dr. Trenton Gammon   . Ddd c-spine repair    . Cystoscopy with stent placement Left 01/27/2015    Procedure: CYSTOSCOPY, RETROGRADE, WITH LEFT STENT PLACEMENT;  Surgeon: Irine Seal, MD;  Location: [facility];  Service: Urology;  Laterality: Left;  I have 2 cases at [facility] and a foley to place at [facility].  I would guess it will be 830-9 before I get up to [facility].   Marland Kitchen Knee arthroscopy Right     There were no vitals filed for this visit.      Subjective Assessment - 06/17/15 0821    Subjective Reports that he worked over the weekend and pain "was about to kill him." Reports increased pain over the last few days and pain is stil down to L calf area. Reports he may have to go back to Dr. Wendi Snipes because pain isn't getting better. Reports feeling like there is a pulling sensation.   Limitations Sitting;Lifting   Patient Stated Goals Get out of pain.   Currently in Pain? Yes   Pain Score 7    Pain  Location Back   Pain Orientation Left;Lower   Pain Type Acute pain   Pain Onset 1 to 4 weeks ago            [facility] PT Assessment - 06/17/15 0001    Assessment   Medical Diagnosis Sciatica of left side.   Next MD Visit None scheduled- After PT   Precautions   Precautions None                     [facility] Adult PT Treatment/Exercise - 06/17/15 0001    Lumbar Exercises: Stretches   Active Hamstring Stretch 3 reps;30 seconds;Other (comment)  BLE   Single Knee to Chest Stretch 3 reps;30 seconds;Other (comment)  BLE   Press Ups Other (comment)  x15 reps   Lumbar Exercises: Supine   Clam 20 reps   Bridge 20 reps   Lumbar Exercises: Prone   Straight Leg Raise 15 reps;Other (comment)  BLE; pain > RLE than LLE   Modalities   Modalities Traction   Traction   Type of Traction Lumbar   Min (lbs) 5   Max (lbs) 90   Hold Time 99   Rest Time 5   Time 15  PT Long Term Goals - 06/17/15 0910    PT LONG TERM GOAL #1   Title Ind with an HEP.   Time 6   Period Weeks   Status Achieved   PT LONG TERM GOAL #2   Title Perform ADL's with pain not > 3/10.   Time 6   Period Weeks   Status Not Met   PT LONG TERM GOAL #3   Title Eliminate left left pain   Time 6   Period Weeks   Status Not Met               Plan - 06/17/15 0854    Clinical Impression Statement Patient tolerated today's treatment fairly well although he arrived with reports of continued increased low back pain and arrived with 7/10 pain. Patient able to complete stretches and prone extension based exercises with minimal pain reported with prone pressups and great deal of pain with prone SLR. No other goals have been achieved at this time secondary to continued pain with activity as well as LLE pain. Traction was maintained again at 90# today with soreness reported following end of traction. Patient was educated to contact MD regarding limited progress with PT and  continued pain.   Rehab Potential Excellent   PT Frequency 2x / week   PT Duration 6 weeks   PT Treatment/Interventions ADLs/Self Care Home Management;Electrical Stimulation;Moist Heat;Therapeutic exercise;Therapeutic activities;Ultrasound;Traction;Patient/family education;Manual techniques   PT Next Visit Plan Continue with core exercises and modalities PRN for pain.   Consulted and Agree with Plan of Care Patient      Patient will benefit from skilled therapeutic intervention in order to improve the following deficits and impairments:  Pain, Decreased activity tolerance  Visit Diagnosis: Radiculopathy, lumbar region     Problem List Patient Active Problem List   Diagnosis Date Noted  . Sciatica of left side 03/31/2015  . Chronic back pain 03/31/2015  . Hydronephrosis with urinary obstruction due to ureteral calculus 01/27/2015  . AKI (acute kidney injury) ([place]) 01/27/2015  . BMI 29.0-29.9,adult 08/12/2014  . Essential hypertension, benign 05/04/2012  . Hyperlipidemia with target LDL less than 100 05/04/2012  . Diabetes ([place]) 05/04/2012    Ahmed Prima, PTA 06/17/2015 9:11 AM  [facility] [place], [place], [address] Phone: [phone]   Fax:  [phone]  Name: Cameron Noble MRN: [id number] Date of Birth: 11/07/1960

## 2015-06-17 NOTE — Therapy (Signed)
[facility] [place], [place], [address] Phone: [phone]   Fax:  [phone]  Physical Therapy Treatment  Patient Details  Name: Cameron Noble MRN: [id number] Date of Birth: 20-Aug-1960 Referring Provider: Kenn File MD.  Encounter Date: 06/17/2015      PT End of Session - 06/17/15 5498    Visit Number 9   Number of Visits 24   Date for PT Re-Evaluation 07/01/15   PT Start Time 0822   PT Stop Time 0907   PT Time Calculation (min) 45 min   Activity Tolerance Patient tolerated treatment well   Behavior During Therapy [facility] for tasks assessed/performed      Past Medical History  Diagnosis Date  . Allergic rhinitis   . DDD (degenerative disc disease), cervical   . Diverticulosis   . Diabetes mellitus   . Hypertension   . Hyperlipidemia   . URI (upper respiratory infection)   . Chronic back pain   . kidney stones   . UTI (lower urinary tract infection) 2017    Past Surgical History  Procedure Laterality Date  . Back surgery  03/2002    Dr. Trenton Gammon   . Ddd c-spine repair    . Cystoscopy with stent placement Left 01/27/2015    Procedure: CYSTOSCOPY, RETROGRADE, WITH LEFT STENT PLACEMENT;  Surgeon: Irine Seal, MD;  Location: [facility];  Service: Urology;  Laterality: Left;  I have 2 cases at [facility] and a foley to place at [facility].  I would guess it will be 830-9 before I get up to [facility].   Marland Kitchen Knee arthroscopy Right     There were no vitals filed for this visit.      Subjective Assessment - 06/17/15 0821    Subjective Reports that he worked over the weekend and pain "was about to kill him." Reports increased pain over the last few days and pain is stil down to L calf area. Reports he may have to go back to Dr. Wendi Snipes because pain isn't getting better. Reports feeling like there is a pulling sensation.   Limitations Sitting;Lifting   Patient Stated Goals Get out of pain.   Currently in Pain? Yes   Pain Score 7    Pain  Location Back   Pain Orientation Left;Lower   Pain Type Acute pain   Pain Onset 1 to 4 weeks ago            [facility] PT Assessment - 06/17/15 0001    Assessment   Medical Diagnosis Sciatica of left side.   Next MD Visit None scheduled- After PT   Precautions   Precautions None                     [facility] Adult PT Treatment/Exercise - 06/17/15 0001    Lumbar Exercises: Stretches   Active Hamstring Stretch 3 reps;30 seconds;Other (comment)  BLE   Single Knee to Chest Stretch 3 reps;30 seconds;Other (comment)  BLE   Press Ups Other (comment)  x15 reps   Lumbar Exercises: Supine   Clam 20 reps   Bridge 20 reps   Lumbar Exercises: Prone   Straight Leg Raise 15 reps;Other (comment)  BLE; pain > RLE than LLE   Modalities   Modalities Traction   Traction   Type of Traction Lumbar   Min (lbs) 5   Max (lbs) 90   Hold Time 99   Rest Time 5   Time 15  PT Long Term Goals - 06/17/15 0910    PT LONG TERM GOAL #1   Title Ind with an HEP.   Time 6   Period Weeks   Status Achieved   PT LONG TERM GOAL #2   Title Perform ADL's with pain not > 3/10.   Time 6   Period Weeks   Status Not Met   PT LONG TERM GOAL #3   Title Eliminate left left pain   Time 6   Period Weeks   Status Not Met               Plan - 06/17/15 0854    Clinical Impression Statement Patient tolerated today's treatment fairly well although he arrived with reports of continued increased low back pain and arrived with 7/10 pain. Patient able to complete stretches and prone extension based exercises with minimal pain reported with prone pressups and great deal of pain with prone SLR. No other goals have been achieved at this time secondary to continued pain with activity as well as LLE pain. Traction was maintained again at 90# today with soreness reported following end of traction. Patient was educated to contact MD regarding limited progress with PT and  continued pain.   Rehab Potential Excellent   PT Frequency 2x / week   PT Duration 6 weeks   PT Treatment/Interventions ADLs/Self Care Home Management;Electrical Stimulation;Moist Heat;Therapeutic exercise;Therapeutic activities;Ultrasound;Traction;Patient/family education;Manual techniques   PT Next Visit Plan Continue with core exercises and modalities PRN for pain.   Consulted and Agree with Plan of Care Patient      Patient will benefit from skilled therapeutic intervention in order to improve the following deficits and impairments:  Pain, Decreased activity tolerance  Visit Diagnosis: Radiculopathy, lumbar region     Problem List Patient Active Problem List   Diagnosis Date Noted  . Sciatica of left side 03/31/2015  . Chronic back pain 03/31/2015  . Hydronephrosis with urinary obstruction due to ureteral calculus 01/27/2015  . AKI (acute kidney injury) ([place]) 01/27/2015  . BMI 29.0-29.9,adult 08/12/2014  . Essential hypertension, benign 05/04/2012  . Hyperlipidemia with target LDL less than 100 05/04/2012  . Diabetes ([place]) 05/04/2012   PHYSICAL THERAPY DISCHARGE SUMMARY  Visits from Start of Care: 9  Current functional level related to goals / functional outcomes: Please see above.   Remaining deficits: No goals met.   Education / Equipment: HEP.  Plan: Patient agrees to discharge.  Patient goals were not met. Patient is being discharged due to lack of progress.  ?????       APPLEGATE, [place] MPT 06/17/2015, 6:45 PM  [facility] [address] [place], [place], [address] Phone: [phone]   Fax:  [phone]  Name: Cameron Noble MRN: [id number] Date of Birth: [birth year]

## 2015-07-09 ENCOUNTER — Encounter (HOSPITAL_COMMUNITY): Payer: Self-pay | Admitting: *Deleted

## 2015-07-10 ENCOUNTER — Ambulatory Visit: Payer: BLUE CROSS/BLUE SHIELD | Admitting: Nurse Practitioner

## 2015-07-10 ENCOUNTER — Ambulatory Visit (HOSPITAL_COMMUNITY): Payer: BLUE CROSS/BLUE SHIELD

## 2015-07-10 ENCOUNTER — Ambulatory Visit (HOSPITAL_COMMUNITY)
Admission: RE | Admit: 2015-07-10 | Discharge: 2015-07-10 | Disposition: A | Discharge status: Home / Self Care | Payer: BLUE CROSS/BLUE SHIELD | Source: Ambulatory Visit | Attending: Urology | Admitting: Urology

## 2015-07-10 ENCOUNTER — Encounter (HOSPITAL_COMMUNITY): Payer: Self-pay | Admitting: *Deleted

## 2015-07-10 ENCOUNTER — Encounter (HOSPITAL_COMMUNITY)
Admission: RE | Disposition: A | Discharge status: Home / Self Care | Payer: Self-pay | Source: Ambulatory Visit | Attending: Urology

## 2015-07-10 DIAGNOSIS — Z538 Procedure and treatment not carried out for other reasons: Secondary | ICD-10-CM | POA: Insufficient documentation

## 2015-07-10 DIAGNOSIS — Z7984 Long term (current) use of oral hypoglycemic drugs: Secondary | ICD-10-CM | POA: Insufficient documentation

## 2015-07-10 DIAGNOSIS — Z79899 Other long term (current) drug therapy: Secondary | ICD-10-CM | POA: Diagnosis not present

## 2015-07-10 DIAGNOSIS — N202 Calculus of kidney with calculus of ureter: Secondary | ICD-10-CM | POA: Diagnosis not present

## 2015-07-10 DIAGNOSIS — I10 Essential (primary) hypertension: Secondary | ICD-10-CM | POA: Diagnosis not present

## 2015-07-10 DIAGNOSIS — E119 Type 2 diabetes mellitus without complications: Secondary | ICD-10-CM | POA: Diagnosis not present

## 2015-07-10 DIAGNOSIS — Z791 Long term (current) use of non-steroidal anti-inflammatories (NSAID): Secondary | ICD-10-CM | POA: Diagnosis not present

## 2015-07-10 DIAGNOSIS — N201 Calculus of ureter: Secondary | ICD-10-CM

## 2015-07-10 DIAGNOSIS — Z01818 Encounter for other preprocedural examination: Secondary | ICD-10-CM | POA: Diagnosis not present

## 2015-07-10 HISTORY — DX: Calculus of kidney: N20.0

## 2015-07-10 LAB — GLUCOSE, CAPILLARY
Glucose-Capillary: 84 mg/dL (ref 65–99)
Glucose-Capillary: 80 mg/dL (ref 65–99)
Glucose-Capillary: 97 mg/dL (ref 65–99)

## 2015-07-10 SURGERY — LITHOTRIPSY, ESWL
Anesthesia: LOCAL | Laterality: Left

## 2015-07-10 MED ORDER — OXYCODONE-ACETAMINOPHEN 5-325 MG PO TABS: 1.0000 | ORAL_TABLET | Freq: Four times a day (QID) | ORAL | Status: DC | PRN

## 2015-07-10 MED ORDER — SODIUM CHLORIDE 0.9 % IV SOLN
INTRAVENOUS | Status: DC
  Administered 2015-07-10: 10:00:00 via INTRAVENOUS

## 2015-07-10 MED ORDER — DIPHENHYDRAMINE HCL 25 MG PO CAPS
25.0000 mg | ORAL_CAPSULE | ORAL | Status: AC
  Administered 2015-07-10: 25 mg via ORAL
  Filled 2015-07-10: qty 1

## 2015-07-10 MED ORDER — DEXTROSE-NACL 5-0.45 % IV SOLN
INTRAVENOUS | Status: DC
  Administered 2015-07-10: 11:00:00 via INTRAVENOUS

## 2015-07-10 MED ORDER — DIAZEPAM 5 MG PO TABS
10.0000 mg | ORAL_TABLET | ORAL | Status: AC
  Administered 2015-07-10: 10 mg via ORAL
  Filled 2015-07-10: qty 2

## 2015-07-10 MED ORDER — CIPROFLOXACIN HCL 500 MG PO TABS
500.0000 mg | ORAL_TABLET | ORAL | Status: AC
  Administered 2015-07-10: 500 mg via ORAL
  Filled 2015-07-10: qty 1

## 2015-07-10 NOTE — H&P (Signed)
  Active Problems 1. History of acute pyelonephritis (Z87.440) 2. Left nephrolithiasis (N20.0) 3. Lumbago (M54.5) 4. Pyuria (N39.0) History of Present Illness Cameron Noble returns today in f/u from an ESWL for a left renal and ureteral stone on 05/15/15. It appears the treatment was focused on the LLP component and he has passed fragments. He has no pain or hematuria but on KUB today the 83mm stone adjacent to L2 on the left is unchanged.  Past Medical History 1. History of acute pyelonephritis (Z87.440) 2. History of diabetes mellitus (Z86.39) 3. History of hypertension (Z86.79) 4. History of renal calculi [id number]) Surgical History 1. History of Back Surgery 2. History of Knee Surgery 3. History of Renal Lithotripsy 4. History of Renal Lithotripsy Current Meds 1. Flomax 0.4 MG Oral Capsule; Therapy: (Recorded:26May2017) to Recorded 2. Fluticasone Propionate 50 MCG/ACT Nasal Suspension; Therapy: (Recorded:27Jan2017) to Recorded 3. GlipiZIDE 5 MG Oral Tablet; Therapy: ([id number]) to Recorded 4. Indapamide 2.5 MG Oral Tablet; take 1 tablet by mouth once daily; Therapy: [id number] to (Evaluate:03Mar2018) Requested for: [id number]; Last Rx:08Mar2017 Ordered 5. Lipitor 10 MG Oral Tablet; Therapy: (Recorded:27Jan2017) to Recorded 6. Lisinopril TABS; Therapy: (Recorded:17Feb2017) to Recorded 7. Meloxicam 7.5 MG Oral Tablet; 1-2 po q day prn pain; Therapy: [id number] to (Last Rx:17Mar2017) Requested for: [id number] Ordered 8. Oxycodone-Acetaminophen 5-325 MG Oral Tablet; Therapy: (Recorded:26May2017) to Recorded 9. Sulfamethoxazole-Trimethoprim 800-160 MG Oral Tablet; TAKE 1 TABLET Twice daily Pt to take 1 TAB bid X 1week then 1 Tab qhs with remaining and refills; Therapy: 04Apr2017 to (Last Rx:04Apr2017) Requested for: 05Apr2017 Ordered 10. Urocit-K 15 15 MEQ (1620 MG) Oral Tablet Extended Release; Therapy: (Recorded:26May2017) to Recorded Allergies 1. No Known Drug  Allergies Family History 1. Family history of Deceased : Mother, Father 2. Family history of lung cancer (Z80.1) : Father 3. Family history of malignant neoplasm (Z80.9) : Mother Social History 1. Caffeine use (F15.90) 2. Does not use tobacco (Z78.9) 3. Married 4. Number of children  2 sons 39. Occupation  Charity fundraiser Review of Systems Genitourinary: no dysuria.  Constitutional: no fever.  Cardiovascular: no chest pain.  Respiratory: no shortness of breath.  Vitals Vital Signs [Data Includes: Last 1 Day]  Recorded: [id number] 10:03AM  Blood Pressure: 105 / 66 Temperature: 98.4 F Heart Rate: 80 Physical Exam Constitutional: Well nourished and well developed . No acute distress.  Pulmonary: No respiratory distress and normal respiratory rhythm and effort.  Cardiovascular: Heart rate and rhythm are normal . No peripheral edema.  Results/Data The following images/tracing/specimen were independently visualized: KUB films reviewed.  The following medical tests were reviewed: KUB report reviewed.  Assessment 1. Calculus of left ureter (N20.1) 2. Left nephrolithiasis (N20.0) He appears to have cleared the LLP stone but the left proximal stone is unchanged and was not mentioned in the treatment note as being targeted.  Plan Lumbago  1. UA With REFLEX; [Do Not Release]; Status:Hold For - Specimen/Data Collection,Appointment; Requested for:23May2017;  I am going to schedule him for a repeat ESWL for the left proximal stone. He will stay on the suppressive antibiotic pending that treatment. He is aware of the risks of the procedure.

## 2015-07-10 NOTE — Progress Notes (Signed)
No sedation given and procedure cancelled on litho truck.  VS/ CBG checked prior to Pt being discharged with perscription.  Son driving, Pt transported out via wheelchair.

## 2015-07-10 NOTE — Op Note (Signed)
After a concerted effort we were unable to locate the stone and on further questioning he did pass some fragments since I saw him in late May.   The ESWL was cancelled.

## 2015-07-10 NOTE — Discharge Instructions (Signed)
Lithotripsy, Care After °Refer to this sheet in the next few weeks. These instructions provide you with information on caring for yourself after your procedure. Your health care provider may also give you more specific instructions. Your treatment has been planned according to current medical practices, but problems sometimes occur. Call your health care provider if you have any problems or questions after your procedure. °WHAT TO EXPECT AFTER THE PROCEDURE  °· Your urine may have a red tinge for a few days after treatment. Blood loss is usually minimal. °· You may have soreness in the back or flank area. This usually goes away after a few days. The procedure can cause blotches or bruises on the back where the pressure wave enters the skin. These marks usually cause only minimal discomfort and should disappear in a short time. °· Stone fragments should begin to pass within 24 hours of treatment. However, a delayed passage is not unusual. °· You may have pain, discomfort, and feel sick to your stomach (nauseated) when the crushed fragments of stone are passed down the tube from the kidney to the bladder. Stone fragments can pass soon after the procedure and may last for up to 4-8 weeks. °· A small number of patients may have severe pain when stone fragments are not able to pass, which leads to an obstruction. °· If your stone is greater than 1 inch (2.5 cm) in diameter or if you have multiple stones that have a combined diameter greater than 1 inch (2.5 cm), you may require more than one treatment. °· If you had a stent placed prior to your procedure, you may experience some discomfort, especially during urination. You may experience the pain or discomfort in your flank or back, or you may experience a sharp pain or discomfort at the base of your penis or in your lower abdomen. The discomfort usually lasts only a few minutes after urinating. °HOME CARE INSTRUCTIONS  °· Rest at home until you feel your energy  improving. °· Only take over-the-counter or prescription medicines for pain, discomfort, or fever as directed by your health care provider. Depending on the type of lithotripsy, you may need to take antibiotics and anti-inflammatory medicines for a few days. °· Drink enough water and fluids to keep your urine clear or pale yellow. This helps "flush" your kidneys. It helps pass any remaining pieces of stone and prevents stones from coming back. °· Most people can resume daily activities within 1-2 days after standard lithotripsy. It can take longer to recover from laser and percutaneous lithotripsy. °· Strain all urine through the provided strainer. Keep all particulate matter and stones for your health care provider to see. The stone may be as small as a grain of salt. It is very important to use the strainer each and every time you pass your urine. Any stones that are found can be sent to a medical lab for examination. °· Visit your health care provider for a follow-up appointment in a few weeks. Your doctor may remove your stent if you have one. Your health care provider will also check to see whether stone particles still remain. °SEEK MEDICAL CARE IF:  °· Your pain is not relieved by medicine. °· You have a lasting nauseous feeling. °· You feel there is too much blood in the urine. °· You develop persistent problems with frequent or painful urination that does not at least partially improve after 2 days following the procedure. °· You have a congested cough. °· You feel   lightheaded. °· You develop a rash or any other signs that might suggest an allergic problem. °· You develop any reaction or side effects to your medicine(s). °SEEK IMMEDIATE MEDICAL CARE IF:  °· You experience severe back or flank pain or both. °· You see nothing but blood when you urinate. °· You cannot pass any urine at all. °· You have a fever or shaking chills. °· You develop shortness of breath, difficulty breathing, or chest pain. °· You  develop vomiting that will not stop after 6-8 hours. °· You have a fainting episode. °  °This information is not intended to replace advice given to you by your health care provider. Make sure you discuss any questions you have with your health care provider. °  °Document Released: 01/17/2007 Document Revised: 09/18/2014 Document Reviewed: 07/13/2012 °Elsevier Interactive Patient Education ©2016 [employer] Inc. ° °

## 2015-07-28 DIAGNOSIS — E119 Type 2 diabetes mellitus without complications: Secondary | ICD-10-CM | POA: Diagnosis not present

## 2015-07-28 DIAGNOSIS — I10 Essential (primary) hypertension: Secondary | ICD-10-CM | POA: Diagnosis not present

## 2015-07-28 DIAGNOSIS — Z008 Encounter for other general examination: Secondary | ICD-10-CM | POA: Diagnosis not present

## 2015-07-28 DIAGNOSIS — E785 Hyperlipidemia, unspecified: Secondary | ICD-10-CM | POA: Diagnosis not present

## 2015-08-07 ENCOUNTER — Ambulatory Visit (INDEPENDENT_AMBULATORY_CARE_PROVIDER_SITE_OTHER): Payer: BLUE CROSS/BLUE SHIELD | Admitting: Nurse Practitioner

## 2015-08-07 ENCOUNTER — Encounter: Payer: Self-pay | Admitting: Nurse Practitioner

## 2015-08-07 VITALS — BP 96/60 | HR 80 | Temp 97.0°F | Ht 67.0 in | Wt 200.0 lb

## 2015-08-07 DIAGNOSIS — E0810 Diabetes mellitus due to underlying condition with ketoacidosis without coma: Secondary | ICD-10-CM | POA: Diagnosis not present

## 2015-08-07 DIAGNOSIS — Z6829 Body mass index (BMI) 29.0-29.9, adult: Secondary | ICD-10-CM | POA: Diagnosis not present

## 2015-08-07 DIAGNOSIS — E785 Hyperlipidemia, unspecified: Secondary | ICD-10-CM | POA: Diagnosis not present

## 2015-08-07 DIAGNOSIS — M549 Dorsalgia, unspecified: Secondary | ICD-10-CM | POA: Diagnosis not present

## 2015-08-07 DIAGNOSIS — E119 Type 2 diabetes mellitus without complications: Secondary | ICD-10-CM

## 2015-08-07 DIAGNOSIS — G8929 Other chronic pain: Secondary | ICD-10-CM | POA: Diagnosis not present

## 2015-08-07 DIAGNOSIS — I10 Essential (primary) hypertension: Secondary | ICD-10-CM | POA: Diagnosis not present

## 2015-08-07 LAB — BAYER DCA HB A1C WAIVED: HB A1C: 5.8 % (ref <7.0)

## 2015-08-07 MED ORDER — MELOXICAM 15 MG PO TABS: 15.0000 mg | ORAL_TABLET | Freq: Every day | ORAL | 0 refills | Status: DC

## 2015-08-07 MED ORDER — ATORVASTATIN CALCIUM 10 MG PO TABS: 10.0000 mg | ORAL_TABLET | Freq: Every day | ORAL | 1 refills | Status: DC

## 2015-08-07 MED ORDER — GLIPIZIDE ER 5 MG PO TB24: 5.0000 mg | ORAL_TABLET | Freq: Every day | ORAL | 1 refills | Status: DC

## 2015-08-07 MED ORDER — LISINOPRIL 10 MG PO TABS: 10.0000 mg | ORAL_TABLET | Freq: Every day | ORAL | 1 refills | Status: DC

## 2015-08-07 NOTE — Patient Instructions (Signed)

## 2015-08-07 NOTE — Progress Notes (Signed)
Subjective:    Patient ID: Cameron Noble, male    DOB: May 16, 1960, 55 y.o.   MRN: [id number]  Patient here today for follow up of chronic medical problems.  C/O low back pain that radiates down bil legs into thigh- Has hx of back pain- Had back surgery in the past- saw Dr. Wendi Snipes with same complaint and he sent him to physical therapy which made no difference- he has appointment with urologist to check kidney stones and after that would like to have MRI of back.   Diabetes  He presents for his follow-up diabetic visit. He has type 2 diabetes mellitus. No MedicAlert identification noted. His disease course has been stable. There are no hypoglycemic associated symptoms. Pertinent negatives for hypoglycemia include no headaches. There are no diabetic associated symptoms. Pertinent negatives for diabetes include no chest pain and no visual change. There are no hypoglycemic complications. Symptoms are stable. There are no diabetic complications. Risk factors for coronary artery disease include diabetes mellitus, dyslipidemia, hypertension and male sex. Current diabetic treatment includes oral agent (monotherapy). He is compliant with treatment most of the time. His weight is stable. When asked about meal planning, he reported none. He has not had a previous visit with a dietitian. He participates in exercise weekly. Home blood sugar record trend: patient has not been checking blood sugars. An ACE inhibitor/angiotensin II receptor blocker is being taken. He does not see a podiatrist.Eye exam is not current.  Hypertension  This is a chronic problem. The current episode started more than 1 year ago. The problem is controlled. Pertinent negatives include no chest pain, headaches, palpitations or shortness of breath. Risk factors for coronary artery disease include diabetes mellitus, dyslipidemia and male gender. Past treatments include ACE inhibitors.  Hyperlipidemia  This is a chronic problem. The current  episode started more than 1 year ago. The problem is controlled. Recent lipid tests were reviewed and are normal. Exacerbating diseases include diabetes. He has no history of hypothyroidism or obesity. Pertinent negatives include no chest pain, myalgias or shortness of breath. Current antihyperlipidemic treatment includes statins. The current treatment provides moderate improvement of lipids. Compliance problems include adherence to diet.  Risk factors for coronary artery disease include dyslipidemia, hypertension and male sex.      Review of Systems  Constitutional: Negative.   HENT: Negative.   Eyes: Negative.   Respiratory: Negative.  Negative for shortness of breath.   Cardiovascular: Negative.  Negative for chest pain and palpitations.  Gastrointestinal: Negative.   Endocrine: Negative.   Genitourinary: Negative.   Musculoskeletal: Negative.  Negative for myalgias.  Skin: Negative.   Allergic/Immunologic: Negative.   Neurological: Negative.  Negative for headaches.  Hematological: Negative.   Psychiatric/Behavioral: Negative.   All other systems reviewed and are negative.      Objective:   Physical Exam  Constitutional: He is oriented to person, place, and time. Vital signs are normal. He appears well-developed and well-nourished.  HENT:  Head: Normocephalic.  Right Ear: Tympanic membrane and external ear normal.  Left Ear: Tympanic membrane and external ear normal.  Nose: Nose normal.  Mouth/Throat: Uvula is midline, oropharynx is clear and moist and mucous membranes are normal.  Eyes: Conjunctivae and lids are normal. Pupils are equal, round, and reactive to light.  Neck: Trachea normal and normal range of motion. Neck supple. No JVD present.  Cardiovascular: Normal rate, regular rhythm, normal heart sounds and normal pulses.   No murmur heard. Pulmonary/Chest: Effort normal and breath sounds normal.  Abdominal: Soft. Bowel sounds are normal.  Musculoskeletal: Normal  range of motion.  Neurological: He is alert and oriented to person, place, and time. He has normal strength.  Skin: Skin is warm, dry and intact.  No edema    Psychiatric: He has a normal mood and affect. His speech is normal and behavior is normal. Judgment and thought content normal.    BP 96/60   Pulse 80   Temp 97 F (36.1 C) (Oral)   Ht 5' 7"  (1.702 m)   Wt 200 lb (90.7 kg)   BMI 31.32 kg/m        Assessment & Plan:  1. Type 2 diabetes mellitus without complication, without long-term current use of insulin (HCC) Continue to watch carbs in diet glipiZIDE (GLUCOTROL XL) 5 MG 24 hr tablet; Take 1 tablet (5 mg total) by mouth daily with breakfast.  Dispense: 90 tablet; Refill: 1  - Bayer DCA Hb A1c Waived - Microalbumin / creatinine urine ratio  2. Essential hypertension, benign Di not add salt to diet - CMP14+EGFR - lisinopril (PRINIVIL,ZESTRIL) 10 MG tablet; Take 1 tablet (10 mg total) by mouth daily.  Dispense: 90 tablet; Refill: 1  3. Hyperlipidemia with target LDL less than 100 Low fat diet - Lipid panel - atorvastatin (LIPITOR) 10 MG tablet; Take 1 tablet (10 mg total) by mouth daily.  Dispense: 90 tablet; Refill: 1  4. BMI 29.0-29.9,adult Discussed diet and exercise for person with BMI >25 Will recheck weight in 3-6 months  5. Chronic back pain mobic daily Will do MRI after he sees urologist for folow up of kidney stones     Labs pending Health maintenance reviewed Diet and exercise encouraged Continue all meds Follow up  In 3 months   [place], FNP

## 2015-08-08 ENCOUNTER — Other Ambulatory Visit: Payer: Self-pay | Admitting: Nurse Practitioner

## 2015-08-08 DIAGNOSIS — I10 Essential (primary) hypertension: Secondary | ICD-10-CM

## 2015-08-08 DIAGNOSIS — E0810 Diabetes mellitus due to underlying condition with ketoacidosis without coma: Secondary | ICD-10-CM

## 2015-08-08 DIAGNOSIS — E785 Hyperlipidemia, unspecified: Secondary | ICD-10-CM

## 2015-08-08 LAB — CMP14+EGFR
ALT: 33 IU/L (ref 0–44)
AST: 16 IU/L (ref 0–40)
Albumin/Globulin Ratio: 1.1 — ABNORMAL LOW (ref 1.2–2.2)
Albumin: 3.9 g/dL (ref 3.5–5.5)
Alkaline Phosphatase: 95 IU/L (ref 39–117)
BUN / CREAT RATIO: 13 (ref 9–20)
BUN: 20 mg/dL (ref 6–24)
Bilirubin Total: 0.5 mg/dL (ref 0.0–1.2)
CALCIUM: 9.6 mg/dL (ref 8.7–10.2)
CO2: 26 mmol/L (ref 18–29)
CREATININE: 1.54 mg/dL — AB (ref 0.76–1.27)
Chloride: 97 mmol/L (ref 96–106)
GFR, EST AFRICAN AMERICAN: 57 mL/min/{1.73_m2} — AB (ref >59)
GFR, EST NON AFRICAN AMERICAN: 49 mL/min/{1.73_m2} — AB (ref >59)
GLUCOSE: 73 mg/dL (ref 65–99)
Globulin, Total: 3.6 g/dL (ref 1.5–4.5)
Potassium: 4.2 mmol/L (ref 3.5–5.2)
Sodium: 137 mmol/L (ref 134–144)
Total Protein: 7.5 g/dL (ref 6.0–8.5)

## 2015-08-08 LAB — LIPID PANEL
CHOLESTEROL TOTAL: 92 mg/dL — AB (ref 100–199)
Chol/HDL Ratio: 3.5 ratio units (ref 0.0–5.0)
HDL: 26 mg/dL — ABNORMAL LOW (ref >39)
LDL CALC: 54 mg/dL (ref 0–99)
Triglycerides: 62 mg/dL (ref 0–149)
VLDL CHOLESTEROL CAL: 12 mg/dL (ref 5–40)

## 2015-08-08 LAB — MICROALBUMIN / CREATININE URINE RATIO
Creatinine, Urine: 201.4 mg/dL
MICROALB/CREAT RATIO: 7.4 mg/g creat (ref 0.0–30.0)
MICROALBUM., U, RANDOM: 15 ug/mL

## 2015-08-09 MED ORDER — GLIPIZIDE ER 5 MG PO TB24: 5.0000 mg | ORAL_TABLET | Freq: Every day | ORAL | 1 refills | Status: DC

## 2015-08-09 MED ORDER — MELOXICAM 15 MG PO TABS: 15.0000 mg | ORAL_TABLET | Freq: Every day | ORAL | 0 refills | Status: DC

## 2015-08-09 MED ORDER — LISINOPRIL 10 MG PO TABS: 10.0000 mg | ORAL_TABLET | Freq: Every day | ORAL | 1 refills | Status: DC

## 2015-08-09 MED ORDER — ATORVASTATIN CALCIUM 10 MG PO TABS: 10.0000 mg | ORAL_TABLET | Freq: Every day | ORAL | 1 refills | Status: DC

## 2015-08-09 NOTE — Telephone Encounter (Signed)
Medication refills sent to [facility]

## 2015-08-12 ENCOUNTER — Telehealth: Payer: Self-pay | Admitting: Nurse Practitioner

## 2015-08-12 NOTE — Telephone Encounter (Signed)
Pt aware that we have changed his pharmacy to [facility]

## 2015-08-15 ENCOUNTER — Ambulatory Visit (HOSPITAL_COMMUNITY)
Admission: RE | Admit: 2015-08-15 | Discharge: 2015-08-15 | Disposition: A | Discharge status: Home / Self Care | Payer: BLUE CROSS/BLUE SHIELD | Source: Ambulatory Visit | Attending: Urology | Admitting: Urology

## 2015-08-15 ENCOUNTER — Ambulatory Visit (INDEPENDENT_AMBULATORY_CARE_PROVIDER_SITE_OTHER): Payer: Self-pay | Admitting: Urology

## 2015-08-15 ENCOUNTER — Other Ambulatory Visit: Payer: Self-pay | Admitting: Urology

## 2015-08-15 DIAGNOSIS — N200 Calculus of kidney: Secondary | ICD-10-CM | POA: Diagnosis not present

## 2015-08-15 DIAGNOSIS — Z87442 Personal history of urinary calculi: Secondary | ICD-10-CM

## 2015-08-15 DIAGNOSIS — Z9889 Other specified postprocedural states: Secondary | ICD-10-CM

## 2015-08-15 DIAGNOSIS — Z87440 Personal history of urinary (tract) infections: Secondary | ICD-10-CM

## 2015-08-16 ENCOUNTER — Emergency Department (HOSPITAL_COMMUNITY)
Admission: EM | Admit: 2015-08-16 | Discharge: 2015-08-16 | Disposition: A | Discharge status: Home / Self Care | Payer: BLUE CROSS/BLUE SHIELD | Attending: Emergency Medicine | Admitting: Emergency Medicine

## 2015-08-16 ENCOUNTER — Encounter (HOSPITAL_COMMUNITY): Payer: Self-pay

## 2015-08-16 DIAGNOSIS — I10 Essential (primary) hypertension: Secondary | ICD-10-CM | POA: Insufficient documentation

## 2015-08-16 DIAGNOSIS — M5441 Lumbago with sciatica, right side: Secondary | ICD-10-CM | POA: Diagnosis not present

## 2015-08-16 DIAGNOSIS — Z79899 Other long term (current) drug therapy: Secondary | ICD-10-CM | POA: Insufficient documentation

## 2015-08-16 DIAGNOSIS — M545 Low back pain: Secondary | ICD-10-CM | POA: Diagnosis present

## 2015-08-16 DIAGNOSIS — Z7982 Long term (current) use of aspirin: Secondary | ICD-10-CM | POA: Diagnosis not present

## 2015-08-16 MED ORDER — HYDROCODONE-ACETAMINOPHEN 5-325 MG PO TABS: 1.0000 | ORAL_TABLET | Freq: Four times a day (QID) | ORAL | 0 refills | Status: DC | PRN

## 2015-08-16 MED ORDER — HYDROCODONE-ACETAMINOPHEN 5-325 MG PO TABS
2.0000 | ORAL_TABLET | Freq: Once | ORAL | Status: AC
  Administered 2015-08-16: 2 via ORAL
  Filled 2015-08-16: qty 2

## 2015-08-16 MED ORDER — IBUPROFEN 800 MG PO TABS: 800.0000 mg | ORAL_TABLET | Freq: Three times a day (TID) | ORAL | 0 refills | Status: DC | PRN

## 2015-08-16 NOTE — ED Triage Notes (Signed)
Right lower back pain, chronic, flared up over last day or so, no new injury

## 2015-08-16 NOTE — ED Provider Notes (Signed)
TIME SEEN: 6:20 AM  CHIEF COMPLAINT: Lower back pain  HPI: Pt is a 55 y.o. male with history of chronic back pain, kidney stones, hypertension, hyperlipidemia who presents to the emergency department with complaints of lower back pain worse on the right side that radiates down the right leg. No numbness, tingling or focal weakness. No bowel or bladder incontinence. No urinary retention. Has had similar pain for several years and has been worse over the past several weeks. States he called his primary care physician who ordered him a prescription of pain medication but states it has to be mailed to him and he is not sure when he will receive it. He states the pain medication starts with a "G" but he cannot remember the name. He denies any new injury to his back. States he's had these symptoms for many years. Denies fever, history of HIV. He is a diabetic. No history of cancer or weight loss. Pain is described as burning and radiates into the left back and right leg. History of back surgery in 2003 but no recent surgery, epidural injection.  ROS: See HPI Constitutional: no fever  Eyes: no drainage  ENT: no runny nose   Cardiovascular:  no chest pain  Resp: no SOB  GI: no vomiting GU: no dysuria Integumentary: no rash  Allergy: no hives  Musculoskeletal: no leg swelling  Neurological: no slurred speech ROS otherwise negative  PAST MEDICAL HISTORY/PAST SURGICAL HISTORY:  Past Medical History:  Diagnosis Date  . Allergic rhinitis   . Chronic back pain   . DDD (degenerative disc disease), cervical   . Diabetes mellitus   . Diverticulosis   . Hyperlipidemia   . Hypertension   . kidney stones   . Kidney stones    multiple stones   . URI (upper respiratory infection)   . UTI (lower urinary tract infection) 2017    MEDICATIONS:  Prior to Admission medications   Medication Sig Start Date End Date Taking? Authorizing Provider  aspirin 81 MG tablet Take 81 mg by mouth daily.    Historical  Provider, MD  atorvastatin (LIPITOR) 10 MG tablet Take 1 tablet (10 mg total) by mouth daily. 08/09/15   [name], FNP  Cholecalciferol (VITAMIN D-3 PO) Take 1 tablet by mouth daily.    Historical Provider, MD  fish oil-omega-3 fatty acids 1000 MG capsule Take 2 g by mouth daily.    Historical Provider, MD  fluticasone (FLONASE) 50 MCG/ACT nasal spray Place 2 sprays into both nostrils daily. 04/23/13   [name], FNP  glipiZIDE (GLUCOTROL XL) 5 MG 24 hr tablet Take 1 tablet (5 mg total) by mouth daily with breakfast. 08/09/15   [name], FNP  indapamide (LOZOL) 2.5 MG tablet Take 2.5 mg by mouth daily.    Historical Provider, MD  lisinopril (PRINIVIL,ZESTRIL) 10 MG tablet Take 1 tablet (10 mg total) by mouth daily. 08/09/15   [name], FNP  meloxicam (MOBIC) 15 MG tablet Take 1 tablet (15 mg total) by mouth daily. 08/09/15   [name], FNP  Potassium Citrate 15 MEQ (1620 MG) TBCR Take 2 tablets by mouth 2 (two) times daily.    Historical Provider, MD  tamsulosin (FLOMAX) 0.4 MG CAPS capsule Take 0.4 mg by mouth daily.    Historical Provider, MD    ALLERGIES:  No Known Allergies  SOCIAL HISTORY:  Social History  Substance Use Topics  . Smoking status: Never Smoker  . Smokeless tobacco: Never Used  . Alcohol use No  Comment: none    FAMILY HISTORY: No family history on file.  EXAM: BP 102/61 (BP Location: Right Arm)   Pulse 73   Temp 98.4 F (36.9 C) (Oral)   Resp 16   Ht 5\' 7"  (1.702 m)   Wt 198 lb (89.8 kg)   SpO2 99%   BMI 31.01 kg/m  CONSTITUTIONAL: Alert and oriented and responds appropriately to questions. Well-appearing; well-nourished HEAD: Normocephalic EYES: Conjunctivae clear, PERRL ENT: normal nose; no rhinorrhea; moist mucous membranes NECK: Supple, no meningismus, no LAD  CARD: RRR; S1 and S2 appreciated; no murmurs, no clicks, no rubs, no gallops RESP: Normal chest excursion without splinting or  tachypnea; breath sounds clear and equal bilaterally; no wheezes, no rhonchi, no rales, no hypoxia or respiratory distress, speaking full sentences ABD/GI: Normal bowel sounds; non-distended; soft, non-tender, no rebound, no guarding, no peritoneal signs BACK:  The back appears normal and is tender over the right lower lumbar paraspinal musculature but no midline spinal tenderness or step-off or deformity, there is no CVA tenderness EXT: Normal ROM in all joints; non-tender to palpation; no edema; normal capillary refill; no cyanosis, no calf tenderness or swelling    SKIN: Normal color for age and race; warm; no rash NEURO: Moves all extremities equally, sensation to light touch intact diffusely, cranial nerves II through XII intact, normal gait, strength 5/5 in all 4 extremities, no saddle anesthesia PSYCH: The patient's mood and manner are appropriate. Grooming and personal hygiene are appropriate.  MEDICAL DECISION MAKING: Patient here with acute exacerbation of his chronic back pain. Suspect sciatica. Doubt kidney status pain is worse with palpation and movement. Recently saw his urologist Dr. Jeffie Pollock and patient reports he was told he had no kidney stones or urinary tract infection. Nothing to suggest epidural abscess, hematoma, transverse myelitis, discitis, cauda equina, spinal stenosis. Doubt fracture given no history of injury. I do not feel this time he needs emergent imaging. Have recommended medical management with ibuprofen, Vicodin and if symptoms do not improve close outpatient follow-up with his PCP. He is comfortable with this plan.  At this time, I do not feel there is any life-threatening condition present. I have reviewed and discussed all results (EKG, imaging, lab, urine as appropriate), exam findings with patient/family. I have reviewed nursing notes and appropriate previous records.  I feel the patient is safe to be discharged home without further emergent workup and can continue  workup as an outpatient. Discussed usual and customary return precautions. Patient/family verbalize understanding and are comfortable with this plan.  Outpatient follow-up has been provided. All questions have been answered.      [place], DO 08/16/15 [id number]

## 2015-08-18 ENCOUNTER — Other Ambulatory Visit: Payer: Self-pay | Admitting: Nurse Practitioner

## 2015-08-20 ENCOUNTER — Other Ambulatory Visit: Payer: Self-pay | Admitting: Nurse Practitioner

## 2015-08-20 NOTE — Telephone Encounter (Signed)
Pt given appt with MMM 8/14 at 9:00.

## 2015-08-25 ENCOUNTER — Ambulatory Visit (INDEPENDENT_AMBULATORY_CARE_PROVIDER_SITE_OTHER): Payer: BLUE CROSS/BLUE SHIELD | Admitting: Nurse Practitioner

## 2015-08-25 ENCOUNTER — Encounter: Payer: Self-pay | Admitting: Nurse Practitioner

## 2015-08-25 VITALS — BP 102/68 | HR 66 | Temp 98.4°F | Ht 67.0 in | Wt 201.0 lb

## 2015-08-25 DIAGNOSIS — M5441 Lumbago with sciatica, right side: Secondary | ICD-10-CM

## 2015-08-25 MED ORDER — METHYLPREDNISOLONE ACETATE 80 MG/ML IJ SUSP
80.0000 mg | Freq: Once | INTRAMUSCULAR | Status: AC
  Administered 2015-08-25: 80 mg via INTRAMUSCULAR

## 2015-08-25 MED ORDER — CYCLOBENZAPRINE HCL 10 MG PO TABS: 10.0000 mg | ORAL_TABLET | Freq: Three times a day (TID) | ORAL | 1 refills | Status: DC | PRN

## 2015-08-25 NOTE — Patient Instructions (Signed)

## 2015-08-25 NOTE — Progress Notes (Signed)
   Subjective:    Patient ID: Cameron Noble, male    DOB: Mar 20, 1960, 55 y.o.   MRN: [id number]  Back Pain  This is a recurrent Cameron Noble to ER last August 5 and was given hydrocodone and was put out of work for several days.) problem. The current episode started more than 1 year ago (Had back surgery in 2004 for ruptured disc.). The problem occurs constantly. The problem has been waxing and waning since onset. The pain is present in the sacro-iliac. The quality of the pain is described as burning and stabbing. The pain radiates to the right thigh and left thigh (does not always radiate.). The pain is at a severity of 6/10. The pain is moderate. The pain is the same all the time. The symptoms are aggravated by bending, lying down, standing and twisting. Stiffness is present in the morning. Pertinent negatives include no numbness, paresis, paresthesias, tingling or weakness. Treatments tried: pain meds from ER. The treatment provided mild relief.      Review of Systems  Respiratory: Negative.   Cardiovascular: Negative.   Musculoskeletal: Positive for back pain.  Neurological: Negative for tingling, weakness, numbness and paresthesias.  Psychiatric/Behavioral: Negative.   All other systems reviewed and are negative.      Objective:   Physical Exam  Constitutional: He is oriented to person, place, and time. He appears well-developed and well-nourished. No distress.  Cardiovascular: Normal rate and normal heart sounds.   Pulmonary/Chest: Effort normal and breath sounds normal.  Musculoskeletal:  FROM of lumbar spine with pain on rotation in either direction and flexion (-) SLR bil Motor strength and sensation distally intact  Neurological: He is alert and oriented to person, place, and time. He has normal reflexes.    BP 102/68   Pulse 66   Temp 98.4 F (36.9 C) (Oral)   Ht 5\' 7"  (1.702 m)   Wt 201 lb (91.2 kg)   BMI 31.48 kg/m       Assessment & Plan:  1. Bilateral low back pain  with right-sided sciatica Moist heat to back No strenuous activity If no better in 1 week will order MRI Sedation precautions with flexeril - methylPREDNISolone acetate (DEPO-MEDROL) injection 80 mg; Inject 1 mL (80 mg total) into the muscle once. - cyclobenzaprine (FLEXERIL) 10 MG tablet; Take 1 tablet (10 mg total) by mouth 3 (three) times daily as needed for muscle spasms.  Dispense: 30 tablet; Refill: [place], FNP

## 2015-10-21 DIAGNOSIS — Z23 Encounter for immunization: Secondary | ICD-10-CM | POA: Diagnosis not present

## 2015-10-29 DIAGNOSIS — E785 Hyperlipidemia, unspecified: Secondary | ICD-10-CM | POA: Diagnosis not present

## 2015-10-29 DIAGNOSIS — E119 Type 2 diabetes mellitus without complications: Secondary | ICD-10-CM | POA: Diagnosis not present

## 2015-10-29 DIAGNOSIS — Z008 Encounter for other general examination: Secondary | ICD-10-CM | POA: Diagnosis not present

## 2015-10-29 DIAGNOSIS — I10 Essential (primary) hypertension: Secondary | ICD-10-CM | POA: Diagnosis not present

## 2015-11-07 ENCOUNTER — Ambulatory Visit (INDEPENDENT_AMBULATORY_CARE_PROVIDER_SITE_OTHER): Payer: BLUE CROSS/BLUE SHIELD | Admitting: Nurse Practitioner

## 2015-11-07 ENCOUNTER — Encounter: Payer: Self-pay | Admitting: Nurse Practitioner

## 2015-11-07 VITALS — BP 114/75 | HR 72 | Temp 98.7°F | Ht 67.0 in | Wt 209.0 lb

## 2015-11-07 DIAGNOSIS — E0810 Diabetes mellitus due to underlying condition with ketoacidosis without coma: Secondary | ICD-10-CM

## 2015-11-07 DIAGNOSIS — I10 Essential (primary) hypertension: Secondary | ICD-10-CM

## 2015-11-07 DIAGNOSIS — G8929 Other chronic pain: Secondary | ICD-10-CM

## 2015-11-07 DIAGNOSIS — M5432 Sciatica, left side: Secondary | ICD-10-CM

## 2015-11-07 DIAGNOSIS — E785 Hyperlipidemia, unspecified: Secondary | ICD-10-CM | POA: Diagnosis not present

## 2015-11-07 DIAGNOSIS — M549 Dorsalgia, unspecified: Secondary | ICD-10-CM

## 2015-11-07 DIAGNOSIS — E119 Type 2 diabetes mellitus without complications: Secondary | ICD-10-CM

## 2015-11-07 DIAGNOSIS — E876 Hypokalemia: Secondary | ICD-10-CM

## 2015-11-07 LAB — CMP14+EGFR
A/G RATIO: 1.1 — AB (ref 1.2–2.2)
ALK PHOS: 96 IU/L (ref 39–117)
ALT: 30 IU/L (ref 0–44)
AST: 20 IU/L (ref 0–40)
Albumin: 4.1 g/dL (ref 3.5–5.5)
BUN/Creatinine Ratio: 16 (ref 9–20)
BUN: 22 mg/dL (ref 6–24)
Bilirubin Total: 0.4 mg/dL (ref 0.0–1.2)
CHLORIDE: 98 mmol/L (ref 96–106)
CO2: 26 mmol/L (ref 18–29)
CREATININE: 1.38 mg/dL — AB (ref 0.76–1.27)
Calcium: 9.8 mg/dL (ref 8.7–10.2)
GFR calc Af Amer: 65 mL/min/{1.73_m2} (ref >59)
GFR calc non Af Amer: 56 mL/min/{1.73_m2} — ABNORMAL LOW (ref >59)
GLOBULIN, TOTAL: 3.7 g/dL (ref 1.5–4.5)
Glucose: 126 mg/dL — ABNORMAL HIGH (ref 65–99)
POTASSIUM: 4 mmol/L (ref 3.5–5.2)
SODIUM: 139 mmol/L (ref 134–144)
Total Protein: 7.8 g/dL (ref 6.0–8.5)

## 2015-11-07 LAB — LIPID PANEL
CHOLESTEROL TOTAL: 106 mg/dL (ref 100–199)
Chol/HDL Ratio: 2.7 ratio units (ref 0.0–5.0)
HDL: 40 mg/dL (ref >39)
LDL Calculated: 57 mg/dL (ref 0–99)
TRIGLYCERIDES: 47 mg/dL (ref 0–149)
VLDL Cholesterol Cal: 9 mg/dL (ref 5–40)

## 2015-11-07 LAB — BAYER DCA HB A1C WAIVED: HB A1C (BAYER DCA - WAIVED): 5.8 % (ref <7.0)

## 2015-11-07 MED ORDER — ATORVASTATIN CALCIUM 10 MG PO TABS: 10.0000 mg | ORAL_TABLET | Freq: Every day | ORAL | 1 refills | Status: DC

## 2015-11-07 MED ORDER — POTASSIUM CITRATE ER 15 MEQ (1620 MG) PO TBCR: 2.0000 | EXTENDED_RELEASE_TABLET | Freq: Two times a day (BID) | ORAL | 5 refills | Status: DC

## 2015-11-07 MED ORDER — LISINOPRIL 10 MG PO TABS: 10.0000 mg | ORAL_TABLET | Freq: Every day | ORAL | 1 refills | Status: DC

## 2015-11-07 MED ORDER — GLIPIZIDE ER 5 MG PO TB24: 5.0000 mg | ORAL_TABLET | Freq: Every day | ORAL | 1 refills | Status: DC

## 2015-11-07 NOTE — Patient Instructions (Signed)

## 2015-11-07 NOTE — Progress Notes (Signed)
Subjective:    Patient ID: Cameron Noble, male    DOB: [birth year], 55 y.o.   MRN: [id number]  Patient here today for follow up of chronic medical problems. No changes since last visit per patient. His concern today is that when we tried to have his MRI completed his insurance would not pay for it.   Diabetes  He presents for his follow-up diabetic visit. He has type 2 diabetes mellitus. No MedicAlert identification noted. His disease course has been stable. There are no hypoglycemic associated symptoms. Pertinent negatives for hypoglycemia include no headaches. There are no diabetic associated symptoms. Pertinent negatives for diabetes include no chest pain and no visual change. There are no hypoglycemic complications. Symptoms are stable. There are no diabetic complications. Risk factors for coronary artery disease include diabetes mellitus, dyslipidemia, hypertension and male sex. Current diabetic treatment includes oral agent (monotherapy). He is compliant with treatment most of the time. His weight is stable. When asked about meal planning, he reported none. He has not had a previous visit with a dietitian. He participates in exercise three times a week. Home blood sugar record trend: patient has not been checking blood sugars. An ACE inhibitor/angiotensin II receptor blocker is being taken. He does not see a podiatrist.Eye exam is not current.  Hypertension  This is a chronic problem. The current episode started more than 1 year ago. The problem is controlled. Pertinent negatives include no chest pain, headaches, palpitations or shortness of breath. Risk factors for coronary artery disease include diabetes mellitus, dyslipidemia and male gender. Past treatments include ACE inhibitors. Compliance problems include diet.   Hyperlipidemia  This is a chronic problem. The current episode started more than 1 year ago. The problem is controlled. Recent lipid tests were reviewed and are normal. Exacerbating  diseases include diabetes. He has no history of hypothyroidism or obesity. Associated symptoms include leg pain. Pertinent negatives include no chest pain, myalgias or shortness of breath. Current antihyperlipidemic treatment includes statins. The current treatment provides moderate improvement of lipids. Compliance problems include adherence to diet.  Risk factors for coronary artery disease include dyslipidemia, hypertension and male sex.  Back Pain  This is a chronic problem. The current episode started more than 1 year ago. The problem occurs constantly. The problem has been gradually worsening since onset. The pain is present in the lumbar spine. The quality of the pain is described as burning. The pain radiates to the left thigh. The pain is at a severity of 8/10. The pain is severe. The pain is the same all the time. The symptoms are aggravated by bending and lying down. Stiffness is present in the morning. Associated symptoms include leg pain. Pertinent negatives include no chest pain or headaches. He has tried analgesics, heat, home exercises, walking and NSAIDs for the symptoms. The treatment provided no relief.    Review of Systems  Constitutional: Negative.   HENT: Negative.   Eyes: Negative.   Respiratory: Negative.  Negative for shortness of breath.   Cardiovascular: Negative.  Negative for chest pain and palpitations.  Gastrointestinal: Negative.   Endocrine: Negative.   Genitourinary: Negative.   Musculoskeletal: Positive for back pain. Negative for myalgias.  Skin: Negative.   Allergic/Immunologic: Negative.   Neurological: Negative.  Negative for headaches.  Hematological: Negative.   Psychiatric/Behavioral: Negative.   All other systems reviewed and are negative.      Objective:   Physical Exam  Constitutional: He is oriented to person, place, and time. Vital signs are normal.  He appears well-developed and well-nourished.  HENT:  Head: Normocephalic.  Right Ear:  Tympanic membrane and external ear normal.  Left Ear: Tympanic membrane and external ear normal.  Nose: Nose normal.  Mouth/Throat: Uvula is midline, oropharynx is clear and moist and mucous membranes are normal.  Eyes: Conjunctivae and lids are normal. Pupils are equal, round, and reactive to light.  Neck: Trachea normal and normal range of motion. Neck supple. No JVD present.  Cardiovascular: Normal rate, regular rhythm, normal heart sounds, intact distal pulses and normal pulses.   No murmur heard. Pulmonary/Chest: Effort normal and breath sounds normal.  Abdominal: Soft. Bowel sounds are normal.  Musculoskeletal: Normal range of motion.  Neurological: He is alert and oriented to person, place, and time. He has normal strength and normal reflexes.  Skin: Skin is warm, dry and intact.  Psychiatric: He has a normal mood and affect. His speech is normal and behavior is normal. Judgment and thought content normal.    BP 114/75   Pulse 72   Temp 98.7 F (37.1 C) (Oral)   Ht _0  (1.702 m)   Wt 209 lb (94.8 kg)   BMI 32.73 kg/m      HgbA1c 5.8% unchanged from 5.8% at last visit. Assessment & Plan:  1. Hyperlipidemia, unspecified hyperlipidemia type Avoid fatty foods. - Lipid panel  2. Essential hypertension, benign Do not add salt to diet. - CMP14+EGFR - lisinopril (PRINIVIL,ZESTRIL) 10 MG tablet; Take 1 tablet (10 mg total) by mouth daily.  Dispense: 90 tablet; Refill: 1  3. Type 2 diabetes mellitus without complication, without long-term current use of insulin (HCC) Encouraged to count carbs. Encouraged to get an eye exam. - Bayer DCA Hb A1c Waived  4. Chronic back pain, unspecified back location, unspecified back pain laterality Continue heat PRN - MR Lumbar Spine Wo Contrast; Future  5. Sciatica of left side  6. Hyperlipidemia with target LDL less than 100 - atorvastatin (LIPITOR) 10 MG tablet; Take 1 tablet (10 mg total) by mouth daily.  Dispense: 90 tablet;  Refill: 1  7. Diabetes mellitus due to underlying condition with ketoacidosis without coma, without long-term current use of insulin (HCC) - glipiZIDE (GLUCOTROL XL) 5 MG 24 hr tablet; Take 1 tablet (5 mg total) by mouth daily with breakfast.  Dispense: 90 tablet; Refill: 1  8. Hypokalemia - Potassium Citrate 15 MEQ (1620 MG) TBCR; Take 2 tablets by mouth 2 (two) times daily.  Dispense: 120 tablet; Refill: 5    Labs pending Health maintenance reviewed Diet and exercise encouraged Continue all meds Follow up in 3 months   Hendricks Limes, BSN-RN/FNP student  [place], [place]

## 2015-11-14 ENCOUNTER — Ambulatory Visit (HOSPITAL_COMMUNITY): Payer: BLUE CROSS/BLUE SHIELD

## 2015-11-21 ENCOUNTER — Ambulatory Visit (HOSPITAL_COMMUNITY)
Admission: RE | Admit: 2015-11-21 | Discharge: 2015-11-21 | Disposition: A | Discharge status: Home / Self Care | Payer: BLUE CROSS/BLUE SHIELD | Source: Ambulatory Visit | Attending: Nurse Practitioner | Admitting: Nurse Practitioner

## 2015-11-21 DIAGNOSIS — Q7649 Other congenital malformations of spine, not associated with scoliosis: Secondary | ICD-10-CM | POA: Insufficient documentation

## 2015-11-21 DIAGNOSIS — M48061 Spinal stenosis, lumbar region without neurogenic claudication: Secondary | ICD-10-CM | POA: Diagnosis not present

## 2015-11-21 DIAGNOSIS — M2578 Osteophyte, vertebrae: Secondary | ICD-10-CM | POA: Diagnosis not present

## 2015-11-21 DIAGNOSIS — G8929 Other chronic pain: Secondary | ICD-10-CM | POA: Diagnosis not present

## 2015-11-21 DIAGNOSIS — M5126 Other intervertebral disc displacement, lumbar region: Secondary | ICD-10-CM | POA: Diagnosis not present

## 2015-11-21 DIAGNOSIS — M549 Dorsalgia, unspecified: Secondary | ICD-10-CM | POA: Insufficient documentation

## 2015-11-21 DIAGNOSIS — M545 Low back pain: Secondary | ICD-10-CM | POA: Diagnosis not present

## 2015-11-24 ENCOUNTER — Other Ambulatory Visit: Payer: Self-pay | Admitting: Nurse Practitioner

## 2015-11-24 ENCOUNTER — Telehealth: Payer: Self-pay | Admitting: Nurse Practitioner

## 2015-11-24 DIAGNOSIS — M48062 Spinal stenosis, lumbar region with neurogenic claudication: Secondary | ICD-10-CM

## 2015-11-24 NOTE — Telephone Encounter (Signed)
Patient will call back

## 2015-12-22 DIAGNOSIS — J302 Other seasonal allergic rhinitis: Secondary | ICD-10-CM | POA: Diagnosis not present

## 2016-01-01 ENCOUNTER — Other Ambulatory Visit: Payer: Self-pay | Admitting: Urology

## 2016-01-01 DIAGNOSIS — Z87442 Personal history of urinary calculi: Secondary | ICD-10-CM

## 2016-01-09 ENCOUNTER — Ambulatory Visit (HOSPITAL_COMMUNITY)
Admission: RE | Admit: 2016-01-09 | Discharge: 2016-01-09 | Disposition: A | Discharge status: Home / Self Care | Payer: BLUE CROSS/BLUE SHIELD | Source: Ambulatory Visit | Attending: Urology | Admitting: Urology

## 2016-01-09 DIAGNOSIS — Z87442 Personal history of urinary calculi: Secondary | ICD-10-CM | POA: Diagnosis not present

## 2016-01-09 DIAGNOSIS — N200 Calculus of kidney: Secondary | ICD-10-CM | POA: Insufficient documentation

## 2016-01-13 DIAGNOSIS — Z6834 Body mass index (BMI) 34.0-34.9, adult: Secondary | ICD-10-CM | POA: Diagnosis not present

## 2016-01-13 DIAGNOSIS — I10 Essential (primary) hypertension: Secondary | ICD-10-CM | POA: Diagnosis not present

## 2016-01-13 DIAGNOSIS — M48062 Spinal stenosis, lumbar region with neurogenic claudication: Secondary | ICD-10-CM | POA: Diagnosis not present

## 2016-02-06 ENCOUNTER — Ambulatory Visit (INDEPENDENT_AMBULATORY_CARE_PROVIDER_SITE_OTHER): Payer: BLUE CROSS/BLUE SHIELD | Admitting: Urology

## 2016-02-06 ENCOUNTER — Other Ambulatory Visit (HOSPITAL_COMMUNITY)
Admission: AD | Admit: 2016-02-06 | Discharge: 2016-02-06 | Disposition: A | Discharge status: Home / Self Care | Payer: BLUE CROSS/BLUE SHIELD | Source: Other Acute Inpatient Hospital | Attending: Urology | Admitting: Urology

## 2016-02-06 DIAGNOSIS — N182 Chronic kidney disease, stage 2 (mild): Secondary | ICD-10-CM

## 2016-02-06 DIAGNOSIS — N200 Calculus of kidney: Secondary | ICD-10-CM | POA: Diagnosis not present

## 2016-02-06 DIAGNOSIS — Z87440 Personal history of urinary (tract) infections: Secondary | ICD-10-CM | POA: Diagnosis not present

## 2016-02-06 LAB — URINALYSIS, COMPLETE (UACMP) WITH MICROSCOPIC
Bilirubin Urine: NEGATIVE
GLUCOSE, UA: NEGATIVE mg/dL
HGB URINE DIPSTICK: NEGATIVE
KETONES UR: NEGATIVE mg/dL
NITRITE: NEGATIVE
PH: 7 (ref 5.0–8.0)
PROTEIN: NEGATIVE mg/dL
Specific Gravity, Urine: 1.017 (ref 1.005–1.030)

## 2016-02-09 LAB — URINE CULTURE

## 2016-02-10 ENCOUNTER — Ambulatory Visit: Payer: BLUE CROSS/BLUE SHIELD | Admitting: Nurse Practitioner

## 2016-02-10 ENCOUNTER — Ambulatory Visit (INDEPENDENT_AMBULATORY_CARE_PROVIDER_SITE_OTHER): Payer: BLUE CROSS/BLUE SHIELD | Admitting: Nurse Practitioner

## 2016-02-10 ENCOUNTER — Encounter: Payer: Self-pay | Admitting: Nurse Practitioner

## 2016-02-10 VITALS — BP 104/71 | HR 73 | Temp 97.9°F | Ht 67.0 in | Wt 215.0 lb

## 2016-02-10 DIAGNOSIS — G8929 Other chronic pain: Secondary | ICD-10-CM

## 2016-02-10 DIAGNOSIS — I10 Essential (primary) hypertension: Secondary | ICD-10-CM | POA: Diagnosis not present

## 2016-02-10 DIAGNOSIS — E119 Type 2 diabetes mellitus without complications: Secondary | ICD-10-CM | POA: Diagnosis not present

## 2016-02-10 DIAGNOSIS — E049 Nontoxic goiter, unspecified: Secondary | ICD-10-CM

## 2016-02-10 DIAGNOSIS — M549 Dorsalgia, unspecified: Secondary | ICD-10-CM

## 2016-02-10 DIAGNOSIS — Z6829 Body mass index (BMI) 29.0-29.9, adult: Secondary | ICD-10-CM

## 2016-02-10 DIAGNOSIS — E785 Hyperlipidemia, unspecified: Secondary | ICD-10-CM | POA: Diagnosis not present

## 2016-02-10 LAB — BAYER DCA HB A1C WAIVED: HB A1C: 6.4 % (ref <7.0)

## 2016-02-10 NOTE — Progress Notes (Signed)
Subjective:    Patient ID: Cameron Noble, male    DOB: [birth year], 58 y.o.   MRN: [id number]  Patient here today for follow up of chronic medical problems. No changes since last visit per patient. NO complaints today. Diabetes  He presents for his follow-up diabetic visit. He has type 2 diabetes mellitus. No MedicAlert identification noted. His disease course has been stable. There are no hypoglycemic associated symptoms. Pertinent negatives for hypoglycemia include no headaches. There are no diabetic associated symptoms. Pertinent negatives for diabetes include no chest pain and no visual change. There are no hypoglycemic complications. Symptoms are stable. There are no diabetic complications. Risk factors for coronary artery disease include diabetes mellitus, dyslipidemia, hypertension and male sex. Current diabetic treatment includes oral agent (monotherapy). He is compliant with treatment most of the time. His weight is stable. When asked about meal planning, he reported none. He has not had a previous visit with a dietitian. He participates in exercise three times a week. Home blood sugar record trend: patient has not been checking blood sugars. An ACE inhibitor/angiotensin II receptor blocker is being taken. He does not see a podiatrist.Eye exam is not current.  Hypertension  This is a chronic problem. The current episode started more than 1 year ago. The problem is controlled. Pertinent negatives include no chest pain, headaches, palpitations or shortness of breath. Risk factors for coronary artery disease include diabetes mellitus, dyslipidemia and male gender. Past treatments include ACE inhibitors. Compliance problems include diet.   Hyperlipidemia  This is a chronic problem. The current episode started more than 1 year ago. The problem is controlled. Recent lipid tests were reviewed and are normal. Exacerbating diseases include diabetes. He has no history of hypothyroidism or obesity. Associated  symptoms include leg pain. Pertinent negatives include no chest pain, myalgias or shortness of breath. Current antihyperlipidemic treatment includes statins. The current treatment provides moderate improvement of lipids. Compliance problems include adherence to diet.  Risk factors for coronary artery disease include dyslipidemia, hypertension and male sex.  Back Pain  This is a chronic problem. The current episode started more than 1 year ago. The problem occurs constantly. The problem has been gradually worsening since onset. The pain is present in the lumbar spine. The quality of the pain is described as burning. The pain radiates to the left thigh. The pain is at a severity of 8/10. The pain is severe. The pain is the same all the time. The symptoms are aggravated by bending and lying down. Stiffness is present in the morning. Associated symptoms include leg pain. Pertinent negatives include no chest pain or headaches. He has tried analgesics, heat, home exercises, walking and NSAIDs for the symptoms. The treatment provided no relief.    Review of Systems  Constitutional: Negative.   HENT: Negative.   Eyes: Negative.   Respiratory: Negative.  Negative for shortness of breath.   Cardiovascular: Negative.  Negative for chest pain and palpitations.  Gastrointestinal: Negative.   Endocrine: Negative.   Genitourinary: Negative.   Musculoskeletal: Positive for back pain. Negative for myalgias.  Skin: Negative.   Allergic/Immunologic: Negative.   Neurological: Negative.  Negative for headaches.  Hematological: Negative.   Psychiatric/Behavioral: Negative.   All other systems reviewed and are negative.      Objective:   Physical Exam  Constitutional: He is oriented to person, place, and time. Vital signs are normal. He appears well-developed and well-nourished.  HENT:  Head: Normocephalic.  Right Ear: Tympanic membrane and external ear normal.  Left Ear: Tympanic membrane and external ear  normal.  Nose: Nose normal.  Mouth/Throat: Uvula is midline, oropharynx is clear and moist and mucous membranes are normal.  Palpable thyroid nodule on right  Eyes: Conjunctivae and lids are normal. Pupils are equal, round, and reactive to light.  Neck: Trachea normal and normal range of motion. Neck supple. No JVD present.  Cardiovascular: Normal rate, regular rhythm, normal heart sounds, intact distal pulses and normal pulses.   No murmur heard. Pulmonary/Chest: Effort normal and breath sounds normal.  Abdominal: Soft. Bowel sounds are normal.  Musculoskeletal: Normal range of motion.  Neurological: He is alert and oriented to person, place, and time. He has normal strength and normal reflexes.  Skin: Skin is warm, dry and intact.  Psychiatric: He has a normal mood and affect. His speech is normal and behavior is normal. Judgment and thought content normal.    BP 104/71   Pulse 73   Temp 97.9 F (36.6 C) (Oral)   Ht 5' 7"  (1.702 m)   Wt 215 lb (97.5 kg)   BMI 33.67 kg/m      HgbA1c 6.4%% up from 5.8% at last visit. Assessment & Plan:  1. Hyperlipidemia, unspecified hyperlipidemia type Low fat diet - Lipid panel  2. Essential hypertension, benign Low sodium diet - CMP14+EGFR  3. Type 2 diabetes mellitus without complication, without long-term current use of insulin (HCC) Continue to watch carbs in diet - Bayer DCA Hb A1c Waived  4. BMI 29.0-29.9,adult Discussed diet and exercise for person with BMI >25 Will recheck weight in 3-6 months  5. Chronic back pain, unspecified back location, unspecified back pain laterality Moist heat Not heavy lifting proper body mechanics  6. Enlarged thyroid - US THYROID; Future    Labs pending Health maintenance reviewed Diet and exercise encouraged Continue all meds Follow up  In 3 months   [place], FNP

## 2016-02-10 NOTE — Patient Instructions (Signed)

## 2016-02-11 LAB — CMP14+EGFR
ALK PHOS: 89 IU/L (ref 39–117)
ALT: 41 IU/L (ref 0–44)
AST: 32 IU/L (ref 0–40)
Albumin/Globulin Ratio: 1.5 (ref 1.2–2.2)
Albumin: 4.5 g/dL (ref 3.5–5.5)
BILIRUBIN TOTAL: 0.3 mg/dL (ref 0.0–1.2)
BUN/Creatinine Ratio: 16 (ref 9–20)
BUN: 22 mg/dL (ref 6–24)
CHLORIDE: 102 mmol/L (ref 96–106)
CO2: 26 mmol/L (ref 18–29)
Calcium: 10.2 mg/dL (ref 8.7–10.2)
Creatinine, Ser: 1.4 mg/dL — ABNORMAL HIGH (ref 0.76–1.27)
GFR calc Af Amer: 64 mL/min/{1.73_m2} (ref >59)
GFR calc non Af Amer: 55 mL/min/{1.73_m2} — ABNORMAL LOW (ref >59)
GLUCOSE: 50 mg/dL — AB (ref 65–99)
Globulin, Total: 3.1 g/dL (ref 1.5–4.5)
Potassium: 4.1 mmol/L (ref 3.5–5.2)
Sodium: 144 mmol/L (ref 134–144)
TOTAL PROTEIN: 7.6 g/dL (ref 6.0–8.5)

## 2016-02-11 LAB — LIPID PANEL
CHOLESTEROL TOTAL: 117 mg/dL (ref 100–199)
Chol/HDL Ratio: 2.5 ratio units (ref 0.0–5.0)
HDL: 46 mg/dL (ref >39)
LDL Calculated: 61 mg/dL (ref 0–99)
TRIGLYCERIDES: 48 mg/dL (ref 0–149)
VLDL CHOLESTEROL CAL: 10 mg/dL (ref 5–40)

## 2016-02-19 DIAGNOSIS — M48062 Spinal stenosis, lumbar region with neurogenic claudication: Secondary | ICD-10-CM | POA: Diagnosis not present

## 2016-02-23 DIAGNOSIS — Z008 Encounter for other general examination: Secondary | ICD-10-CM | POA: Diagnosis not present

## 2016-02-23 DIAGNOSIS — I10 Essential (primary) hypertension: Secondary | ICD-10-CM | POA: Diagnosis not present

## 2016-02-23 DIAGNOSIS — E119 Type 2 diabetes mellitus without complications: Secondary | ICD-10-CM | POA: Diagnosis not present

## 2016-02-23 DIAGNOSIS — E785 Hyperlipidemia, unspecified: Secondary | ICD-10-CM | POA: Diagnosis not present

## 2016-02-24 ENCOUNTER — Ambulatory Visit (HOSPITAL_COMMUNITY): Admission: RE | Admit: 2016-02-24 | Payer: BLUE CROSS/BLUE SHIELD | Source: Ambulatory Visit

## 2016-02-27 ENCOUNTER — Ambulatory Visit (HOSPITAL_COMMUNITY)
Admission: RE | Admit: 2016-02-27 | Discharge: 2016-02-27 | Disposition: A | Discharge status: Home / Self Care | Payer: BLUE CROSS/BLUE SHIELD | Source: Ambulatory Visit | Attending: Nurse Practitioner | Admitting: Nurse Practitioner

## 2016-02-27 DIAGNOSIS — E049 Nontoxic goiter, unspecified: Secondary | ICD-10-CM

## 2016-02-27 DIAGNOSIS — E010 Iodine-deficiency related diffuse (endemic) goiter: Secondary | ICD-10-CM | POA: Diagnosis not present

## 2016-03-11 DIAGNOSIS — M48062 Spinal stenosis, lumbar region with neurogenic claudication: Secondary | ICD-10-CM | POA: Diagnosis not present

## 2016-04-06 DIAGNOSIS — M48062 Spinal stenosis, lumbar region with neurogenic claudication: Secondary | ICD-10-CM | POA: Diagnosis not present

## 2016-06-06 ENCOUNTER — Other Ambulatory Visit: Payer: Self-pay | Admitting: Nurse Practitioner

## 2016-06-06 DIAGNOSIS — E876 Hypokalemia: Secondary | ICD-10-CM

## 2016-06-11 ENCOUNTER — Ambulatory Visit: Payer: BLUE CROSS/BLUE SHIELD | Admitting: Nurse Practitioner

## 2016-06-15 ENCOUNTER — Encounter: Payer: Self-pay | Admitting: Nurse Practitioner

## 2016-06-15 ENCOUNTER — Ambulatory Visit (INDEPENDENT_AMBULATORY_CARE_PROVIDER_SITE_OTHER): Payer: BLUE CROSS/BLUE SHIELD | Admitting: Nurse Practitioner

## 2016-06-15 VITALS — BP 131/82 | HR 68 | Temp 98.4°F | Ht 67.0 in | Wt 218.0 lb

## 2016-06-15 DIAGNOSIS — I10 Essential (primary) hypertension: Secondary | ICD-10-CM

## 2016-06-15 DIAGNOSIS — E782 Mixed hyperlipidemia: Secondary | ICD-10-CM

## 2016-06-15 DIAGNOSIS — E876 Hypokalemia: Secondary | ICD-10-CM | POA: Insufficient documentation

## 2016-06-15 DIAGNOSIS — E119 Type 2 diabetes mellitus without complications: Secondary | ICD-10-CM | POA: Diagnosis not present

## 2016-06-15 DIAGNOSIS — Z23 Encounter for immunization: Secondary | ICD-10-CM | POA: Diagnosis not present

## 2016-06-15 DIAGNOSIS — E785 Hyperlipidemia, unspecified: Secondary | ICD-10-CM | POA: Diagnosis not present

## 2016-06-15 DIAGNOSIS — Z6829 Body mass index (BMI) 29.0-29.9, adult: Secondary | ICD-10-CM | POA: Diagnosis not present

## 2016-06-15 LAB — BAYER DCA HB A1C WAIVED: HB A1C: 5.8 % (ref <7.0)

## 2016-06-15 MED ORDER — GLIPIZIDE ER 5 MG PO TB24: 5.0000 mg | ORAL_TABLET | Freq: Every day | ORAL | 1 refills | Status: DC

## 2016-06-15 MED ORDER — POTASSIUM CITRATE ER 15 MEQ (1620 MG) PO TBCR: 2.0000 | EXTENDED_RELEASE_TABLET | Freq: Two times a day (BID) | ORAL | 1 refills | Status: DC

## 2016-06-15 MED ORDER — LISINOPRIL 10 MG PO TABS: 10.0000 mg | ORAL_TABLET | Freq: Every day | ORAL | 1 refills | Status: DC

## 2016-06-15 MED ORDER — ATORVASTATIN CALCIUM 10 MG PO TABS: 10.0000 mg | ORAL_TABLET | Freq: Every day | ORAL | 1 refills | Status: DC

## 2016-06-15 NOTE — Progress Notes (Signed)
Subjective:    Patient ID: Cameron Noble, male    DOB: Jun 30, 1960, 56 y.o.   MRN: [id number]  HPI  Cameron Noble is here today for follow up of chronic medical problem.  Outpatient Encounter Prescriptions as of 06/15/2016  Medication Sig  . aspirin 81 MG tablet Take 81 mg by mouth daily.  Marland Kitchen atorvastatin (LIPITOR) 10 MG tablet Take 1 tablet (10 mg total) by mouth daily.  . Cholecalciferol (VITAMIN D-3 PO) Take 1 tablet by mouth daily.  . fish oil-omega-3 fatty acids 1000 MG capsule Take 2 g by mouth daily.  . fluticasone (FLONASE) 50 MCG/ACT nasal spray Place 2 sprays into both nostrils daily.  Marland Kitchen glipiZIDE (GLUCOTROL XL) 5 MG 24 hr tablet Take 1 tablet (5 mg total) by mouth daily with breakfast.  . indapamide (LOZOL) 2.5 MG tablet Take 2.5 mg by mouth daily.  Marland Kitchen lisinopril (PRINIVIL,ZESTRIL) 10 MG tablet Take 1 tablet (10 mg total) by mouth daily.  . Potassium Citrate 15 MEQ (1620 MG) TBCR TAKE TWO TABLETS BY MOUTH TWICE DAILY   No facility-administered encounter medications on file as of 06/15/2016.     1. Mixed hyperlipidemia  Does not watch diet. Takes Lipitor daily  2. Essential hypertension, benign  No c/o chest pain,SOB or HA- does not check blood pressure at home  3. Type 2 diabetes mellitus without complication, without long-term current use of insulin (HCC) last HGBA1c was 6.4%- blood sugar below 120- does not check every day- no symptoms of hypoglycemia  4. BMI 29.0-29.9,adult  No recent weight change  5. Hypokalemia  On potassium supplements daily- no c/o lower ext cramping    New complaints: none today     Review of Systems  Constitutional: Negative.  Negative for activity change and appetite change.  HENT: Negative.   Eyes: Negative for photophobia, pain and visual disturbance.  Respiratory: Negative for shortness of breath.   Cardiovascular: Negative for chest pain, palpitations and leg swelling.  Gastrointestinal: Negative for abdominal pain.  Endocrine:  Negative for polydipsia, polyphagia and polyuria.  Genitourinary: Negative.   Musculoskeletal: Negative.   Skin: Negative for rash.  Neurological: Negative for dizziness, weakness and headaches.  Hematological: Does not bruise/bleed easily.  Psychiatric/Behavioral: Negative.   All other systems reviewed and are negative.      Objective:   Physical Exam  Constitutional: He is oriented to person, place, and time. He appears well-developed and well-nourished.  HENT:  Head: Normocephalic.  Right Ear: External ear normal.  Left Ear: External ear normal.  Nose: Nose normal.  Mouth/Throat: Oropharynx is clear and moist.  Eyes: EOM are normal. Pupils are equal, round, and reactive to light.  Neck: Normal range of motion. Neck supple. No JVD present. No thyromegaly present.  Cardiovascular: Normal rate, regular rhythm, normal heart sounds and intact distal pulses.  Exam reveals no gallop and no friction rub.   No murmur heard. Pulmonary/Chest: Effort normal and breath sounds normal. No respiratory distress. He has no wheezes. He has no rales. He exhibits no tenderness.  Abdominal: Soft. Bowel sounds are normal. He exhibits no mass. There is no tenderness.  Genitourinary: Prostate normal and penis normal.  Musculoskeletal: Normal range of motion. He exhibits no edema.  Lymphadenopathy:    He has no cervical adenopathy.  Neurological: He is alert and oriented to person, place, and time. No cranial nerve deficit.  Skin: Skin is warm and dry.  Psychiatric: He has a normal mood and affect. His behavior is normal. Judgment and  thought content normal.   BP 131/82   Pulse 68   Temp 98.4 F (36.9 C) (Oral)   Ht 5' 7"  (1.702 m)   Wt 218 lb (98.9 kg)   BMI 34.14 kg/m   hgba1c 5.8     Assessment & Plan:  1. Mixed hyperlipidemia Low fat diet - Lipid panel - atorvastatin (LIPITOR) 10 MG tablet; Take 1 tablet (10 mg total) by mouth daily.  Dispense: 90 tablet; Refill: 1  2. Essential  hypertension, benign Low sodium diet - CMP14+EGFR - lisinopril (PRINIVIL,ZESTRIL) 10 MG tablet; Take 1 tablet (10 mg total) by mouth daily.  Dispense: 90 tablet; Refill: 1  3. Type 2 diabetes mellitus without complication, without long-term current use of insulin (HCC) Continue to watch carbs in diet - Bayer DCA Hb A1c Waived - glipiZIDE (GLUCOTROL XL) 5 MG 24 hr tablet; Take 1 tablet (5 mg total) by mouth daily with breakfast.  Dispense: 90 tablet; Refill: 1  4. BMI 29.0-29.9,adult Discussed diet and exercise for person with BMI >25 Will recheck weight in 3-6 months  5. Hypokalemia - Potassium Citrate 15 MEQ (1620 MG) TBCR; Take 2 tablets by mouth 2 (two) times daily.  Dispense: 120 tablet; Refill: 1    Patient encouraged to get eye exam Labs pending Health maintenance reviewed Diet and exercise encouraged Continue all meds Follow up  In 3 months    [place], FNP

## 2016-06-15 NOTE — Patient Instructions (Signed)

## 2016-06-16 LAB — LIPID PANEL
Chol/HDL Ratio: 2.5 ratio (ref 0.0–5.0)
Cholesterol, Total: 107 mg/dL (ref 100–199)
HDL: 43 mg/dL (ref >39)
LDL Calculated: 54 mg/dL (ref 0–99)
Triglycerides: 51 mg/dL (ref 0–149)
VLDL Cholesterol Cal: 10 mg/dL (ref 5–40)

## 2016-06-16 LAB — CMP14+EGFR
A/G RATIO: 1.5 (ref 1.2–2.2)
ALK PHOS: 96 IU/L (ref 39–117)
ALT: 30 IU/L (ref 0–44)
AST: 28 IU/L (ref 0–40)
Albumin: 4.4 g/dL (ref 3.5–5.5)
BILIRUBIN TOTAL: 0.4 mg/dL (ref 0.0–1.2)
BUN / CREAT RATIO: 18 (ref 9–20)
BUN: 22 mg/dL (ref 6–24)
CO2: 26 mmol/L (ref 18–29)
Calcium: 9.9 mg/dL (ref 8.7–10.2)
Chloride: 107 mmol/L — ABNORMAL HIGH (ref 96–106)
Creatinine, Ser: 1.25 mg/dL (ref 0.76–1.27)
GFR calc non Af Amer: 63 mL/min/{1.73_m2} (ref >59)
GFR, EST AFRICAN AMERICAN: 73 mL/min/{1.73_m2} (ref >59)
Globulin, Total: 3 g/dL (ref 1.5–4.5)
Glucose: 53 mg/dL — ABNORMAL LOW (ref 65–99)
POTASSIUM: 4.3 mmol/L (ref 3.5–5.2)
Sodium: 144 mmol/L (ref 134–144)
TOTAL PROTEIN: 7.4 g/dL (ref 6.0–8.5)

## 2016-06-18 ENCOUNTER — Other Ambulatory Visit: Payer: BLUE CROSS/BLUE SHIELD

## 2016-06-18 DIAGNOSIS — Z1211 Encounter for screening for malignant neoplasm of colon: Secondary | ICD-10-CM | POA: Diagnosis not present

## 2016-06-21 LAB — FECAL OCCULT BLOOD, IMMUNOCHEMICAL: FECAL OCCULT BLD: NEGATIVE

## 2016-07-07 DIAGNOSIS — I10 Essential (primary) hypertension: Secondary | ICD-10-CM | POA: Diagnosis not present

## 2016-07-07 DIAGNOSIS — Z719 Counseling, unspecified: Secondary | ICD-10-CM | POA: Diagnosis not present

## 2016-07-07 DIAGNOSIS — E785 Hyperlipidemia, unspecified: Secondary | ICD-10-CM | POA: Diagnosis not present

## 2016-07-07 DIAGNOSIS — E119 Type 2 diabetes mellitus without complications: Secondary | ICD-10-CM | POA: Diagnosis not present

## 2016-07-07 DIAGNOSIS — Z008 Encounter for other general examination: Secondary | ICD-10-CM | POA: Diagnosis not present

## 2016-07-27 ENCOUNTER — Other Ambulatory Visit: Payer: Self-pay | Admitting: Urology

## 2016-07-27 DIAGNOSIS — N200 Calculus of kidney: Secondary | ICD-10-CM

## 2016-07-27 DIAGNOSIS — Z87440 Personal history of urinary (tract) infections: Secondary | ICD-10-CM

## 2016-08-13 ENCOUNTER — Ambulatory Visit (HOSPITAL_COMMUNITY)
Admission: RE | Admit: 2016-08-13 | Discharge: 2016-08-13 | Disposition: A | Discharge status: Home / Self Care | Payer: BLUE CROSS/BLUE SHIELD | Source: Ambulatory Visit | Attending: Urology | Admitting: Urology

## 2016-08-13 DIAGNOSIS — Z87440 Personal history of urinary (tract) infections: Secondary | ICD-10-CM

## 2016-08-13 DIAGNOSIS — N200 Calculus of kidney: Secondary | ICD-10-CM

## 2016-08-20 ENCOUNTER — Ambulatory Visit (INDEPENDENT_AMBULATORY_CARE_PROVIDER_SITE_OTHER): Payer: BLUE CROSS/BLUE SHIELD | Admitting: Urology

## 2016-08-20 ENCOUNTER — Other Ambulatory Visit (HOSPITAL_COMMUNITY)
Admission: AD | Admit: 2016-08-20 | Discharge: 2016-08-20 | Disposition: A | Discharge status: Home / Self Care | Payer: BLUE CROSS/BLUE SHIELD | Source: Other Acute Inpatient Hospital | Attending: Urology | Admitting: Urology

## 2016-08-20 DIAGNOSIS — Z87442 Personal history of urinary calculi: Secondary | ICD-10-CM

## 2016-08-20 DIAGNOSIS — Z87440 Personal history of urinary (tract) infections: Secondary | ICD-10-CM

## 2016-08-20 LAB — URINALYSIS, COMPLETE (UACMP) WITH MICROSCOPIC
BILIRUBIN URINE: NEGATIVE
GLUCOSE, UA: NEGATIVE mg/dL
KETONES UR: NEGATIVE mg/dL
Nitrite: POSITIVE — AB
PH: 6 (ref 5.0–8.0)
Protein, ur: NEGATIVE mg/dL
Specific Gravity, Urine: 1.023 (ref 1.005–1.030)
Squamous Epithelial / LPF: NONE SEEN

## 2016-08-23 LAB — URINE CULTURE: Culture: >=100000 — AB

## 2016-09-08 DIAGNOSIS — I10 Essential (primary) hypertension: Secondary | ICD-10-CM | POA: Diagnosis not present

## 2016-09-08 DIAGNOSIS — E119 Type 2 diabetes mellitus without complications: Secondary | ICD-10-CM | POA: Diagnosis not present

## 2016-09-08 DIAGNOSIS — E785 Hyperlipidemia, unspecified: Secondary | ICD-10-CM | POA: Diagnosis not present

## 2016-09-08 DIAGNOSIS — Z008 Encounter for other general examination: Secondary | ICD-10-CM | POA: Diagnosis not present

## 2016-09-08 DIAGNOSIS — Z719 Counseling, unspecified: Secondary | ICD-10-CM | POA: Diagnosis not present

## 2016-09-17 ENCOUNTER — Ambulatory Visit (INDEPENDENT_AMBULATORY_CARE_PROVIDER_SITE_OTHER): Payer: BLUE CROSS/BLUE SHIELD | Admitting: Nurse Practitioner

## 2016-09-17 ENCOUNTER — Encounter: Payer: Self-pay | Admitting: Nurse Practitioner

## 2016-09-17 VITALS — BP 126/88 | HR 76 | Temp 98.3°F | Ht 67.0 in | Wt 217.0 lb

## 2016-09-17 DIAGNOSIS — E0810 Diabetes mellitus due to underlying condition with ketoacidosis without coma: Secondary | ICD-10-CM | POA: Diagnosis not present

## 2016-09-17 DIAGNOSIS — G8929 Other chronic pain: Secondary | ICD-10-CM

## 2016-09-17 DIAGNOSIS — Z6829 Body mass index (BMI) 29.0-29.9, adult: Secondary | ICD-10-CM

## 2016-09-17 DIAGNOSIS — E782 Mixed hyperlipidemia: Secondary | ICD-10-CM | POA: Diagnosis not present

## 2016-09-17 DIAGNOSIS — I10 Essential (primary) hypertension: Secondary | ICD-10-CM

## 2016-09-17 DIAGNOSIS — Z125 Encounter for screening for malignant neoplasm of prostate: Secondary | ICD-10-CM | POA: Diagnosis not present

## 2016-09-17 DIAGNOSIS — E876 Hypokalemia: Secondary | ICD-10-CM

## 2016-09-17 DIAGNOSIS — E785 Hyperlipidemia, unspecified: Secondary | ICD-10-CM | POA: Diagnosis not present

## 2016-09-17 DIAGNOSIS — M549 Dorsalgia, unspecified: Secondary | ICD-10-CM | POA: Diagnosis not present

## 2016-09-17 LAB — BAYER DCA HB A1C WAIVED: HB A1C: 5.9 % (ref <7.0)

## 2016-09-17 MED ORDER — MELOXICAM 7.5 MG PO TABS: 7.5000 mg | ORAL_TABLET | Freq: Every day | ORAL | 5 refills | Status: DC

## 2016-09-17 NOTE — Patient Instructions (Signed)

## 2016-09-17 NOTE — Progress Notes (Signed)
Subjective:    Patient ID: Cameron Noble, male    DOB: 28-Jan-1960, 56 y.o.   MRN: [id number]  HPI  Matt Delpizzo is here today for follow up of chronic medical problem.  Outpatient Encounter Prescriptions as of 09/17/2016  Medication Sig  . aspirin 81 MG tablet Take 81 mg by mouth daily.  Marland Kitchen atorvastatin (LIPITOR) 10 MG tablet Take 1 tablet (10 mg total) by mouth daily.  . Cholecalciferol (VITAMIN D-3 PO) Take 1 tablet by mouth daily.  . fish oil-omega-3 fatty acids 1000 MG capsule Take 2 g by mouth daily.  . fluticasone (FLONASE) 50 MCG/ACT nasal spray Place 2 sprays into both nostrils daily.  Marland Kitchen glipiZIDE (GLUCOTROL XL) 5 MG 24 hr tablet Take 1 tablet (5 mg total) by mouth daily with breakfast.  . indapamide (LOZOL) 2.5 MG tablet Take 2.5 mg by mouth daily.  Marland Kitchen lisinopril (PRINIVIL,ZESTRIL) 10 MG tablet Take 1 tablet (10 mg total) by mouth daily.  . meloxicam (MOBIC) 7.5 MG tablet   . Potassium Citrate 15 MEQ (1620 MG) TBCR Take 2 tablets by mouth 2 (two) times daily.   No facility-administered encounter medications on file as of 09/17/2016.     1. Essential hypertension, benign  No c/o chest pain,SOB or headache. Does not check blood pressure at home  2. Diabetes mellitus due to underlying condition with ketoacidosis without coma, without long-term current use of insulin (HCC)  Last hgba1c was 5.9%. He does not check blood sugars everyday but usually around 130 fasting.He has had no hypogylcemia.  3. Hypokalemia  Denies any lower ext cramping  4. Hyperlipidemia with target LDL less than 100  Does not watch diet  5. Chronic back pain, unspecified back location, unspecified back pain laterality  takes mobic daily for his back which really helps- does not want anything stronger  6. BMI 29.0-29.9,adult  No recent weight changes  7. Prostate cancer screening  Needs screening has not had in several years- denies any problem with urination or stream.    New complaints: None  today  Social history: Still working 40 hours a week    Review of Systems  Constitutional: Negative for activity change and appetite change.  HENT: Negative.   Eyes: Negative for pain.  Respiratory: Negative for shortness of breath.   Cardiovascular: Negative for chest pain, palpitations and leg swelling.  Gastrointestinal: Negative for abdominal pain.  Endocrine: Negative for polydipsia.  Genitourinary: Negative.   Skin: Negative for rash.  Neurological: Negative for dizziness, weakness and headaches.  Hematological: Does not bruise/bleed easily.  Psychiatric/Behavioral: Negative.   All other systems reviewed and are negative.      Objective:   Physical Exam  Constitutional: He is oriented to person, place, and time. He appears well-developed and well-nourished.  HENT:  Head: Normocephalic.  Right Ear: External ear normal.  Left Ear: External ear normal.  Nose: Nose normal.  Mouth/Throat: Oropharynx is clear and moist.  Eyes: Pupils are equal, round, and reactive to light. EOM are normal.  Neck: Normal range of motion. Neck supple. No JVD present. No thyromegaly present.  Cardiovascular: Normal rate, regular rhythm, normal heart sounds and intact distal pulses.  Exam reveals no gallop and no friction rub.   No murmur heard. Pulmonary/Chest: Effort normal and breath sounds normal. No respiratory distress. He has no wheezes. He has no rales. He exhibits no tenderness.  Abdominal: Soft. Bowel sounds are normal. He exhibits no mass. There is no tenderness.  Genitourinary: Prostate normal and penis  normal.  Musculoskeletal: Normal range of motion. He exhibits no edema.  Lymphadenopathy:    He has no cervical adenopathy.  Neurological: He is alert and oriented to person, place, and time. No cranial nerve deficit.  Skin: Skin is warm and dry.  Psychiatric: He has a normal mood and affect. His behavior is normal. Judgment and thought content normal.   BP 126/88   Pulse 76    Temp 98.3 F (36.8 C) (Oral)   Ht 5' 7"  (1.702 m)   Wt 217 lb (98.4 kg)   BMI 33.99 kg/m   hgba1c 5.9%       Assessment & Plan:  1. Essential hypertension, benign Low sodium diet - CMP14+EGFR  2. Diabetes mellitus due to underlying condition with ketoacidosis without coma, without long-term current use of insulin (HCC) Low carb diet to be ocntinued - Bayer DCA Hb A1c Waived  3. Hypokalemia  4. Hyperlipidemia with target LDL less than 100 Low fat  - Lipid panel  5. Chronic back pain, unspecified back location, unspecified back pain laterality Back stretches  6. BMI 29.0-29.9,adult Discussed diet and exercise for person with BMI >25 Will recheck weight in 3-6 months  7. Prostate cancer screening - PSA, total and free    Labs pending Health maintenance reviewed Diet and exercise encouraged Continue all meds Follow up  In 3 months   [place], FNP

## 2016-09-18 LAB — CMP14+EGFR
ALBUMIN: 4 g/dL (ref 3.5–5.5)
ALK PHOS: 107 IU/L (ref 39–117)
ALT: 32 IU/L (ref 0–44)
AST: 35 IU/L (ref 0–40)
Albumin/Globulin Ratio: 1.3 (ref 1.2–2.2)
BUN / CREAT RATIO: 16 (ref 9–20)
BUN: 26 mg/dL — ABNORMAL HIGH (ref 6–24)
Bilirubin Total: 0.4 mg/dL (ref 0.0–1.2)
CALCIUM: 9.8 mg/dL (ref 8.7–10.2)
CO2: 23 mmol/L (ref 20–29)
CREATININE: 1.62 mg/dL — AB (ref 0.76–1.27)
Chloride: 106 mmol/L (ref 96–106)
GFR, EST AFRICAN AMERICAN: 53 mL/min/{1.73_m2} — AB (ref >59)
GFR, EST NON AFRICAN AMERICAN: 46 mL/min/{1.73_m2} — AB (ref >59)
GLOBULIN, TOTAL: 3.2 g/dL (ref 1.5–4.5)
GLUCOSE: 55 mg/dL — AB (ref 65–99)
Potassium: 4.6 mmol/L (ref 3.5–5.2)
Sodium: 146 mmol/L — ABNORMAL HIGH (ref 134–144)
TOTAL PROTEIN: 7.2 g/dL (ref 6.0–8.5)

## 2016-09-18 LAB — LIPID PANEL
CHOL/HDL RATIO: 2.5 ratio (ref 0.0–5.0)
CHOLESTEROL TOTAL: 102 mg/dL (ref 100–199)
HDL: 41 mg/dL (ref >39)
LDL CALC: 49 mg/dL (ref 0–99)
Triglycerides: 58 mg/dL (ref 0–149)
VLDL CHOLESTEROL CAL: 12 mg/dL (ref 5–40)

## 2016-09-18 LAB — PSA, TOTAL AND FREE
PSA FREE: 0.33 ng/mL
PSA, Free Pct: 20.6 %
Prostate Specific Ag, Serum: 1.6 ng/mL (ref 0.0–4.0)

## 2016-10-01 IMAGING — CR DG ABDOMEN 1V
2 series · 2 of 2 positions shown · non-contrast
Comparison: 01/27/2015.  CT 01/27/2015.

CLINICAL DATA: Lithotripsy.

EXAM:
ABDOMEN - 1 VIEW

[t abdomen supine (1 of 2)]
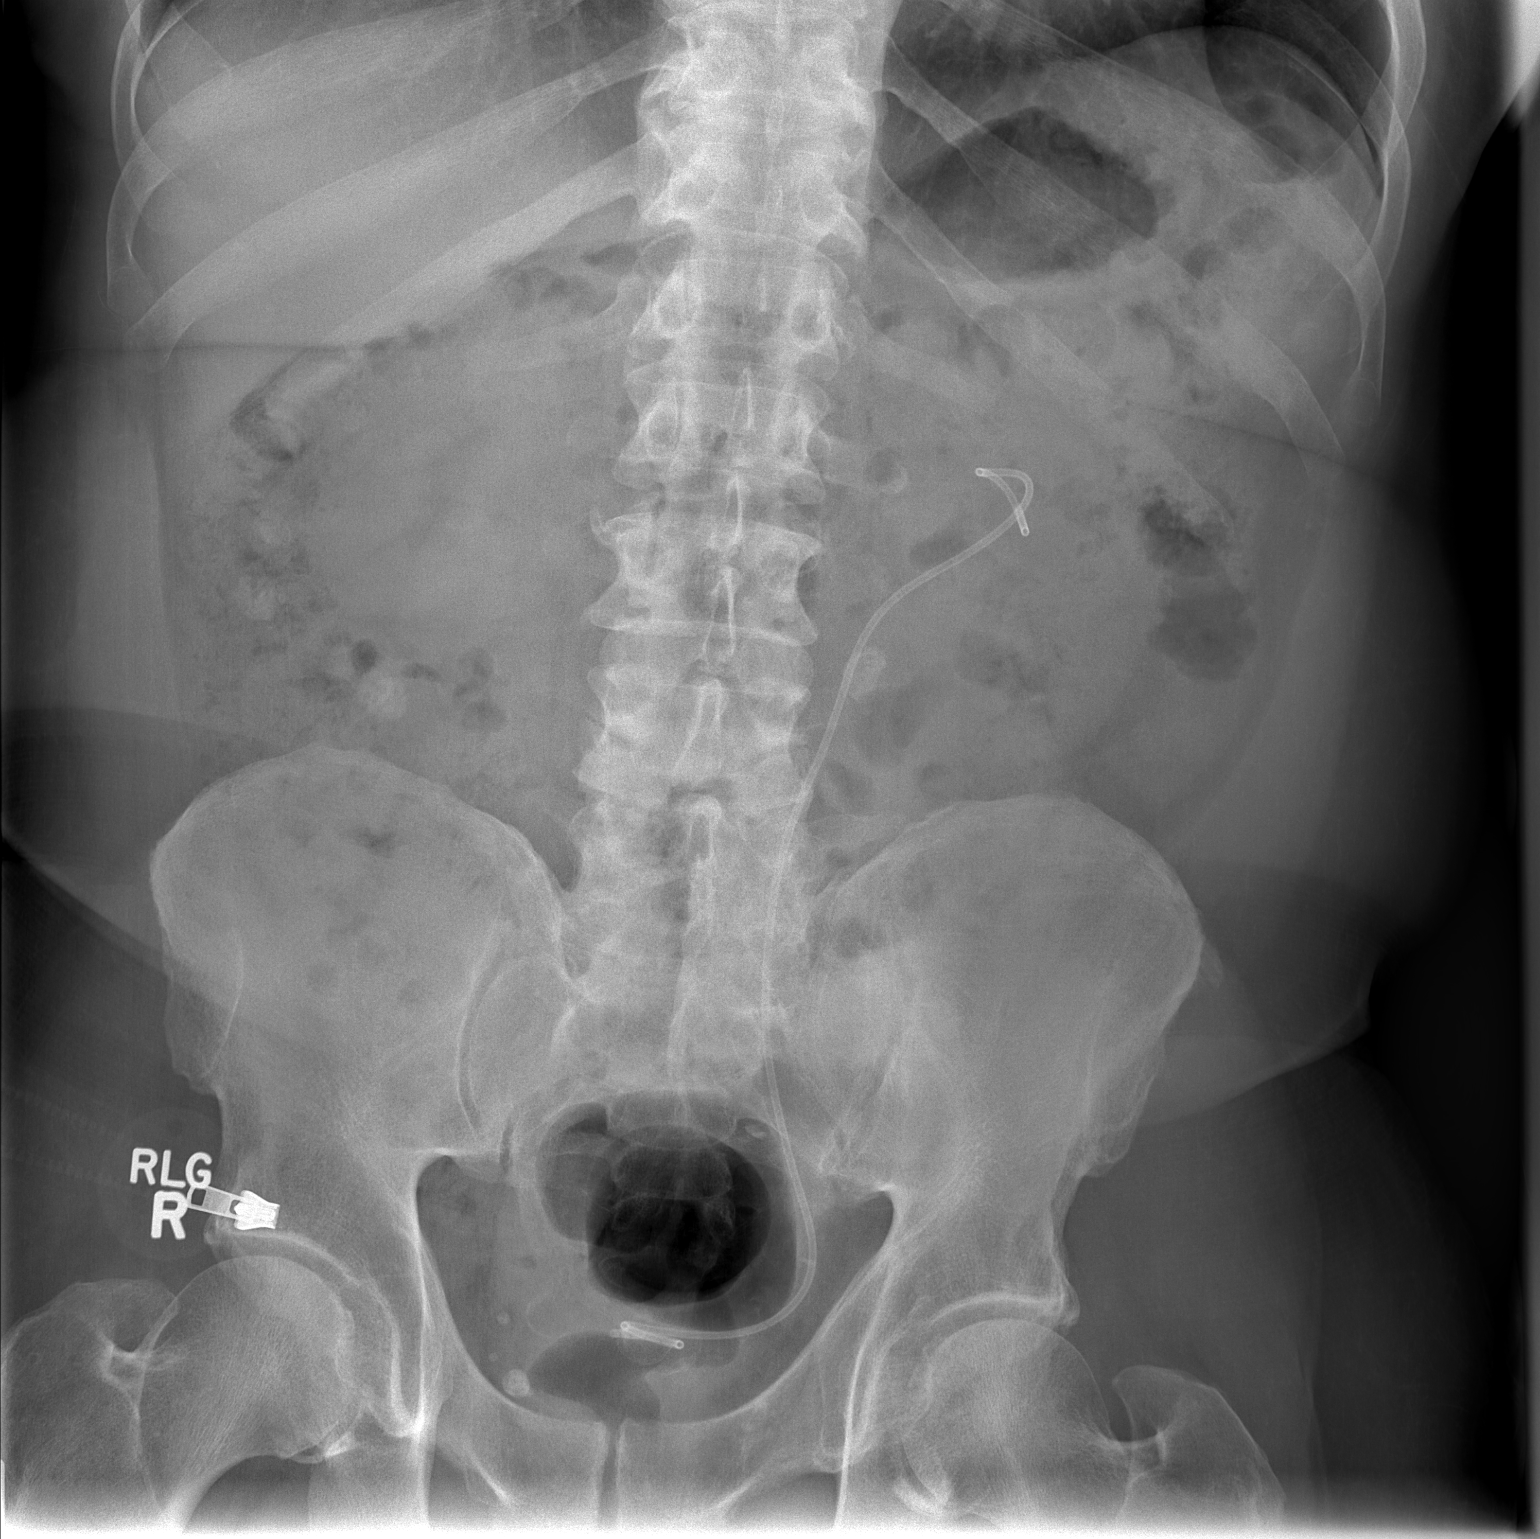

[t abdomen supine (2 of 2)]
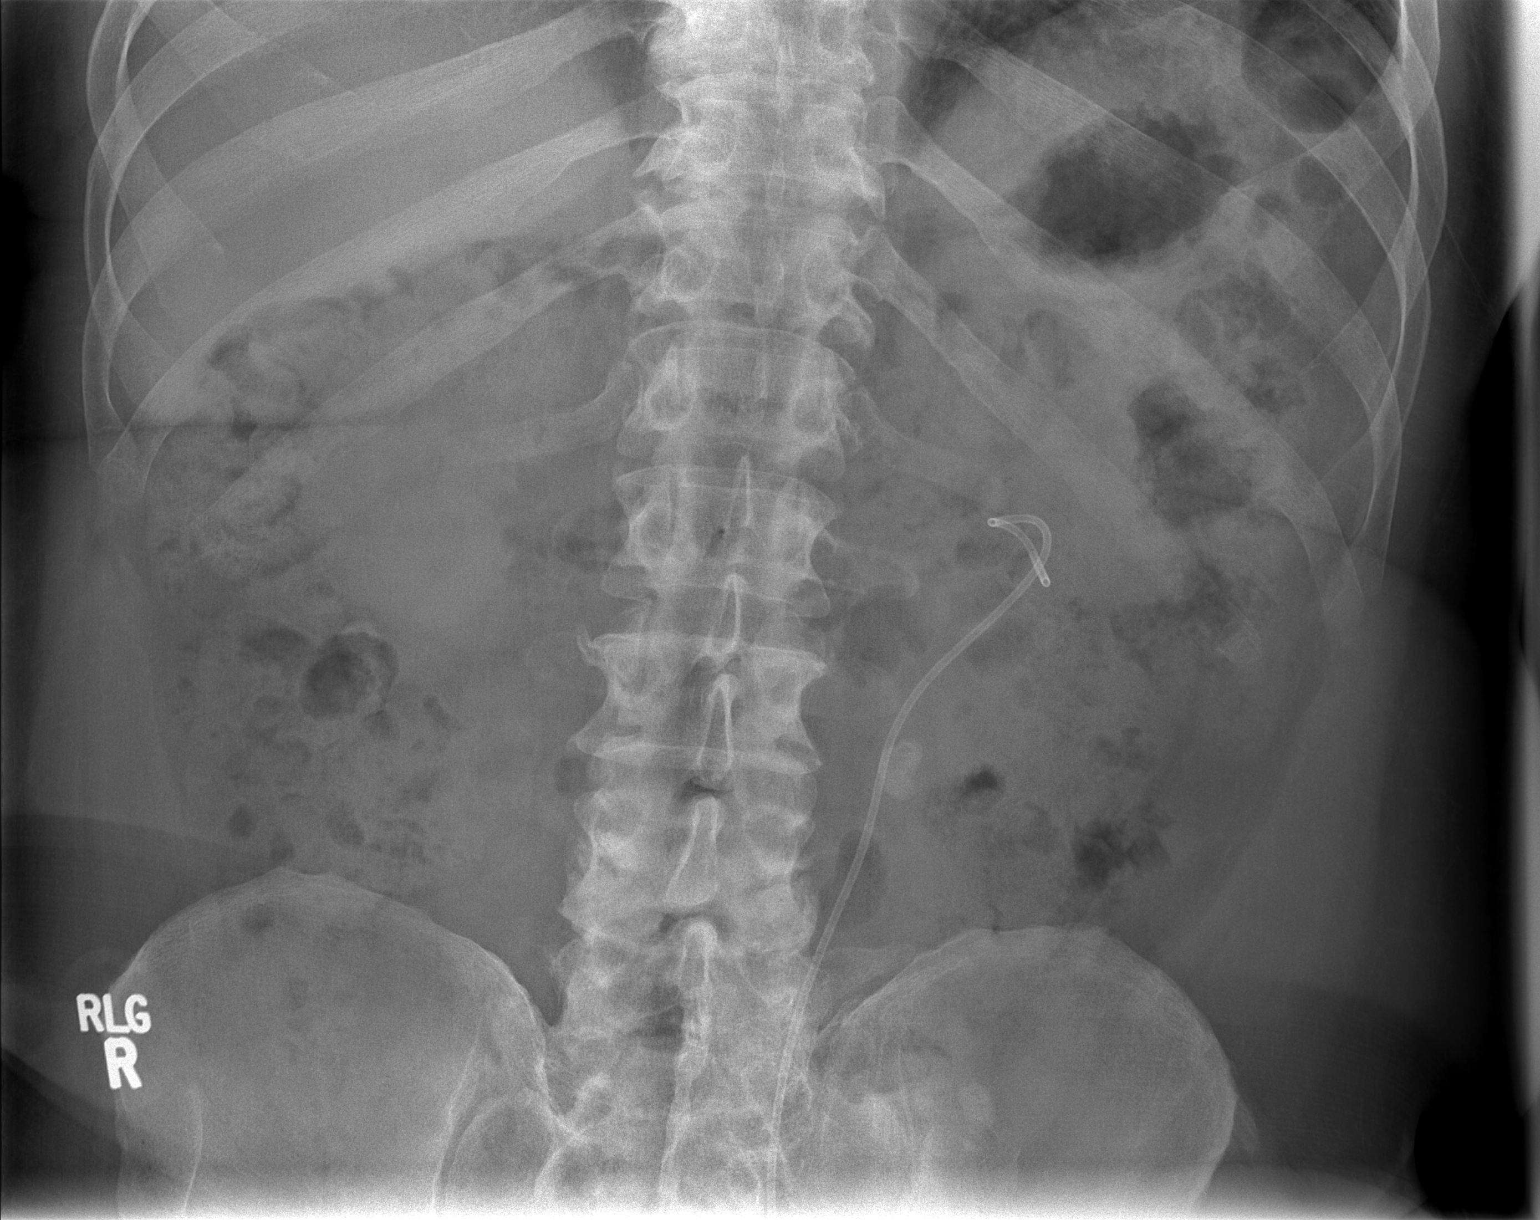

[2 of 2 positions shown; findings below may reference images not displayed]

FINDINGS: Left double-J ureteral stent noted in good anatomic position.
Prominent stone noted projected over the left upper ureter. No bowel
distention. Pelvic calcifications consistent phleboliths.
Degenerative changes lumbar spine
IMPRESSION: Left double-J ureteral stent in good anatomic position. Prominent
stone fragment noted over the left upper ureter.

## 2016-10-15 IMAGING — DX DG ABDOMEN 1V
1 series · 1 of 1 positions shown · non-contrast
Comparison: 02/07/2015

CLINICAL DATA: Left renal calculus and recent lithotripsy

EXAM:
ABDOMEN - 1 VIEW

[abdomen kub]
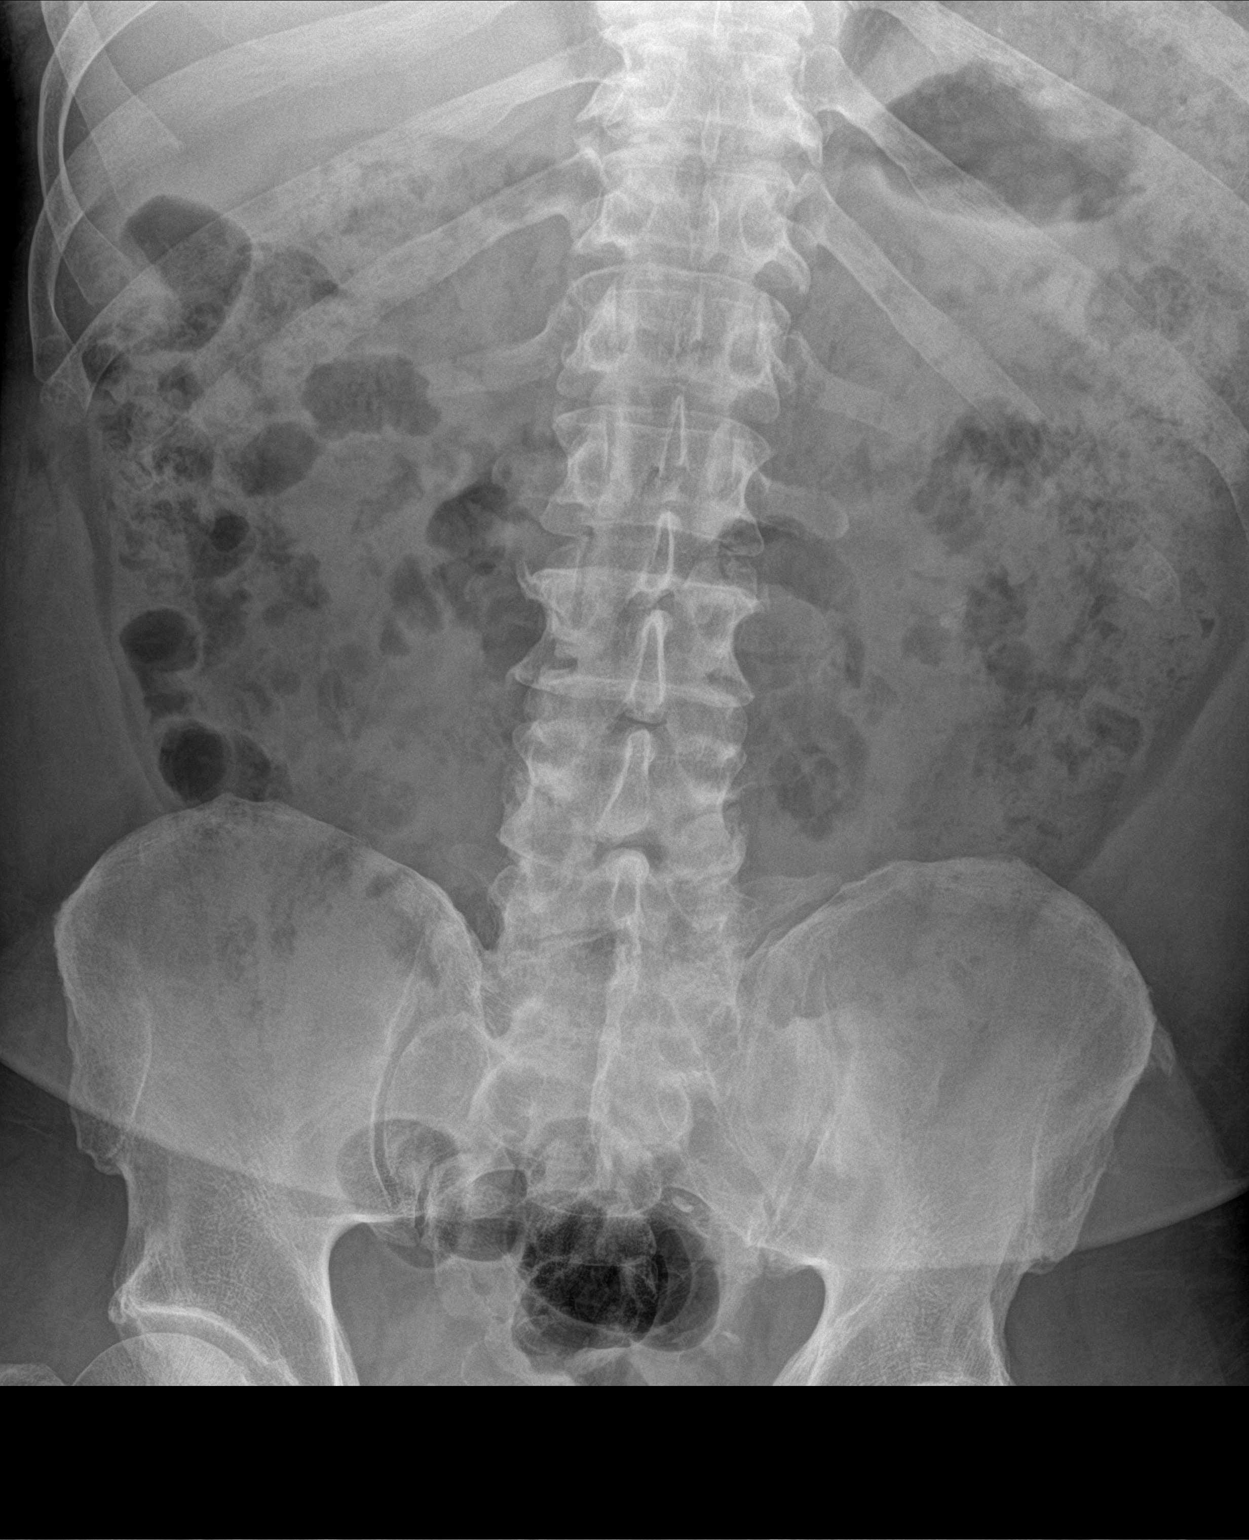

[1 of 1 positions shown; findings below may reference images not displayed]

FINDINGS: The previously seen left ureteral stent has been removed in the
interval. The previously seen proximal ureteral stone is not well
appreciated on this study. Multiple stone fragments are noted in the
lower pole of the left kidney. The largest of these measures 8 mm in
greatest dimension. No other focal abnormality is noted.
IMPRESSION: Multiple left lower pole ureteral stones.

## 2016-11-05 ENCOUNTER — Other Ambulatory Visit: Payer: Self-pay | Admitting: Nurse Practitioner

## 2016-11-05 DIAGNOSIS — E876 Hypokalemia: Secondary | ICD-10-CM

## 2016-11-19 ENCOUNTER — Ambulatory Visit: Payer: BLUE CROSS/BLUE SHIELD | Admitting: Urology

## 2016-11-22 DIAGNOSIS — E119 Type 2 diabetes mellitus without complications: Secondary | ICD-10-CM | POA: Diagnosis not present

## 2016-11-22 DIAGNOSIS — I10 Essential (primary) hypertension: Secondary | ICD-10-CM | POA: Diagnosis not present

## 2016-11-22 DIAGNOSIS — Z719 Counseling, unspecified: Secondary | ICD-10-CM | POA: Diagnosis not present

## 2016-11-22 DIAGNOSIS — Z008 Encounter for other general examination: Secondary | ICD-10-CM | POA: Diagnosis not present

## 2016-11-22 DIAGNOSIS — E785 Hyperlipidemia, unspecified: Secondary | ICD-10-CM | POA: Diagnosis not present

## 2016-12-20 ENCOUNTER — Ambulatory Visit: Payer: BLUE CROSS/BLUE SHIELD | Admitting: Nurse Practitioner

## 2016-12-31 ENCOUNTER — Other Ambulatory Visit (HOSPITAL_COMMUNITY)
Admission: AD | Admit: 2016-12-31 | Discharge: 2016-12-31 | Disposition: A | Discharge status: Home / Self Care | Payer: BLUE CROSS/BLUE SHIELD | Source: Other Acute Inpatient Hospital | Attending: Urology | Admitting: Urology

## 2016-12-31 ENCOUNTER — Ambulatory Visit: Payer: BLUE CROSS/BLUE SHIELD | Admitting: Urology

## 2016-12-31 DIAGNOSIS — Z87442 Personal history of urinary calculi: Secondary | ICD-10-CM | POA: Diagnosis not present

## 2016-12-31 DIAGNOSIS — N390 Urinary tract infection, site not specified: Secondary | ICD-10-CM | POA: Insufficient documentation

## 2016-12-31 LAB — URINALYSIS, ROUTINE W REFLEX MICROSCOPIC
BILIRUBIN URINE: NEGATIVE
GLUCOSE, UA: NEGATIVE mg/dL
HGB URINE DIPSTICK: NEGATIVE
KETONES UR: NEGATIVE mg/dL
NITRITE: NEGATIVE
PROTEIN: NEGATIVE mg/dL
Specific Gravity, Urine: 1.019 (ref 1.005–1.030)
Squamous Epithelial / LPF: NONE SEEN
pH: 6 (ref 5.0–8.0)

## 2017-01-03 LAB — URINE CULTURE: Culture: 30000 — AB

## 2017-01-06 ENCOUNTER — Encounter: Payer: Self-pay | Admitting: Nurse Practitioner

## 2017-01-06 ENCOUNTER — Ambulatory Visit: Payer: BLUE CROSS/BLUE SHIELD | Admitting: Nurse Practitioner

## 2017-01-06 VITALS — BP 125/78 | HR 66 | Temp 97.9°F | Ht 67.0 in | Wt 223.0 lb

## 2017-01-06 DIAGNOSIS — Z6829 Body mass index (BMI) 29.0-29.9, adult: Secondary | ICD-10-CM | POA: Diagnosis not present

## 2017-01-06 DIAGNOSIS — E785 Hyperlipidemia, unspecified: Secondary | ICD-10-CM

## 2017-01-06 DIAGNOSIS — E782 Mixed hyperlipidemia: Secondary | ICD-10-CM | POA: Diagnosis not present

## 2017-01-06 DIAGNOSIS — I10 Essential (primary) hypertension: Secondary | ICD-10-CM

## 2017-01-06 DIAGNOSIS — E876 Hypokalemia: Secondary | ICD-10-CM | POA: Diagnosis not present

## 2017-01-06 DIAGNOSIS — E119 Type 2 diabetes mellitus without complications: Secondary | ICD-10-CM

## 2017-01-06 LAB — BAYER DCA HB A1C WAIVED: HB A1C (BAYER DCA - WAIVED): 6 % (ref <7.0)

## 2017-01-06 MED ORDER — POTASSIUM CITRATE ER 15 MEQ (1620 MG) PO TBCR: 2.0000 | EXTENDED_RELEASE_TABLET | Freq: Two times a day (BID) | ORAL | 1 refills | Status: DC

## 2017-01-06 MED ORDER — ATORVASTATIN CALCIUM 10 MG PO TABS: 10.0000 mg | ORAL_TABLET | Freq: Every day | ORAL | 1 refills | Status: DC

## 2017-01-06 MED ORDER — GLIPIZIDE ER 5 MG PO TB24: 5.0000 mg | ORAL_TABLET | Freq: Every day | ORAL | 1 refills | Status: DC

## 2017-01-06 NOTE — Progress Notes (Signed)
Subjective:    Patient ID: Cameron Noble, male    DOB: [birth year], 58 y.o.   MRN: [id number]  HPI  Cameron Noble is here today for follow up of chronic medical problem.  Outpatient Encounter Medications as of 01/06/2017  Medication Sig  . aspirin 81 MG tablet Take 81 mg by mouth daily.  Marland Kitchen atorvastatin (LIPITOR) 10 MG tablet Take 1 tablet (10 mg total) by mouth daily.  . Cholecalciferol (VITAMIN D-3 PO) Take 1 tablet by mouth daily.  . fish oil-omega-3 fatty acids 1000 MG capsule Take 2 g by mouth daily.  Marland Kitchen glipiZIDE (GLUCOTROL XL) 5 MG 24 hr tablet Take 1 tablet (5 mg total) by mouth daily with breakfast.  . lisinopril (PRINIVIL,ZESTRIL) 10 MG tablet Take 1 tablet (10 mg total) by mouth daily.  . meloxicam (MOBIC) 7.5 MG tablet Take 1 tablet (7.5 mg total) by mouth daily.  . Potassium Citrate 15 MEQ (1620 MG) TBCR TAKE 2 TABLETS BY MOUTH TWICE DAILY     1. Essential hypertension, benign  No c/o chest pain, sob or headache. Does not check blood pressures at home BP Readings from Last 3 Encounters:  01/06/17 125/78  09/17/16 126/88  06/15/16 131/82     2. Type 2 diabetes mellitus without complication, without long-term current use of insulin (HCC) last HGBA!C was 5.9%. Blood sugars fasting at home average below 120. No hypoglycemic events  3. Hyperlipidemia with target LDL less than 100  Watches fat in diet most of the tome  4. BMI 29.0-29.9,adult  No recent change in weight  5. Hypokalemia  No c/o lower ext cramping    New complaints: None today  Social history: Still working    Review of Systems  Constitutional: Negative for activity change and appetite change.  HENT: Negative.   Eyes: Negative for pain.  Respiratory: Negative for shortness of breath.   Cardiovascular: Negative for chest pain, palpitations and leg swelling.  Gastrointestinal: Negative for abdominal pain.  Endocrine: Negative for polydipsia.  Genitourinary: Negative.   Skin: Negative for rash.    Neurological: Negative for dizziness, weakness and headaches.  Hematological: Does not bruise/bleed easily.  Psychiatric/Behavioral: Negative.   All other systems reviewed and are negative.      Objective:   Physical Exam  Constitutional: He is oriented to person, place, and time. He appears well-developed and well-nourished.  HENT:  Head: Normocephalic.  Right Ear: External ear normal.  Left Ear: External ear normal.  Nose: Nose normal.  Mouth/Throat: Oropharynx is clear and moist.  Eyes: EOM are normal. Pupils are equal, round, and reactive to light.  Neck: Normal range of motion. Neck supple. No JVD present. No thyromegaly present.  Cardiovascular: Normal rate, regular rhythm, normal heart sounds and intact distal pulses. Exam reveals no gallop and no friction rub.  No murmur heard. Pulmonary/Chest: Effort normal and breath sounds normal. No respiratory distress. He has no wheezes. He has no rales. He exhibits no tenderness.  Abdominal: Soft. Bowel sounds are normal. He exhibits no mass. There is no tenderness.  Genitourinary: Prostate normal and penis normal.  Musculoskeletal: Normal range of motion. He exhibits no edema.  Lymphadenopathy:    He has no cervical adenopathy.  Neurological: He is alert and oriented to person, place, and time. No cranial nerve deficit.  Skin: Skin is warm and dry.  Psychiatric: He has a normal mood and affect. His behavior is normal. Judgment and thought content normal.   BP 125/78   Pulse 66  Temp 97.9 F (36.6 C) (Oral)   Ht 5' 7"  (1.702 m)   Wt 223 lb (101.2 kg)   BMI 34.93 kg/m   hgba1c 6.0% today      Assessment & Plan:  1. Essential hypertension, benign Low sodium diet - CMP14+EGFR  2. Type 2 diabetes mellitus without complication, without long-term current use of insulin (HCC) Continue to watch carbs in diet - Bayer DCA Hb A1c Waived - Microalbumin / creatinine urine ratio - glipiZIDE (GLUCOTROL XL) 5 MG 24 hr tablet;  Take 1 tablet (5 mg total) by mouth daily with breakfast.  Dispense: 90 tablet; Refill: 1  3. Hyperlipidemia with target LDL less than 100 Low fay diet - Lipid panel  4. BMI 29.0-29.9,adult Discussed diet and exercise for person with BMI >25 Will recheck weight in 3-6 months  5. Hypokalemia - Potassium Citrate 15 MEQ (1620 MG) TBCR; Take 2 tablets by mouth 2 (two) times daily.  Dispense: 120 tablet; Refill: 1  6. Mixed hyperlipidemia - atorvastatin (LIPITOR) 10 MG tablet; Take 1 tablet (10 mg total) by mouth daily.  Dispense: 90 tablet; Refill: 1    Labs pending Health maintenance reviewed Diet and exercise encouraged Continue all meds Follow up  In 3 months   [place], FNP

## 2017-01-06 NOTE — Patient Instructions (Signed)

## 2017-01-07 LAB — MICROALBUMIN / CREATININE URINE RATIO
CREATININE, UR: 138.3 mg/dL
MICROALB/CREAT RATIO: 16.9 mg/g{creat} (ref 0.0–30.0)
Microalbumin, Urine: 23.4 ug/mL

## 2017-01-07 LAB — CMP14+EGFR
A/G RATIO: 1.5 (ref 1.2–2.2)
ALBUMIN: 4.3 g/dL (ref 3.5–5.5)
ALT: 31 IU/L (ref 0–44)
AST: 23 IU/L (ref 0–40)
Alkaline Phosphatase: 112 IU/L (ref 39–117)
BUN / CREAT RATIO: 15 (ref 9–20)
BUN: 23 mg/dL (ref 6–24)
Bilirubin Total: 0.3 mg/dL (ref 0.0–1.2)
CALCIUM: 10.1 mg/dL (ref 8.7–10.2)
CO2: 26 mmol/L (ref 20–29)
CREATININE: 1.53 mg/dL — AB (ref 0.76–1.27)
Chloride: 104 mmol/L (ref 96–106)
GFR, EST AFRICAN AMERICAN: 57 mL/min/{1.73_m2} — AB (ref >59)
GFR, EST NON AFRICAN AMERICAN: 49 mL/min/{1.73_m2} — AB (ref >59)
GLOBULIN, TOTAL: 2.9 g/dL (ref 1.5–4.5)
Glucose: 110 mg/dL — ABNORMAL HIGH (ref 65–99)
POTASSIUM: 4.5 mmol/L (ref 3.5–5.2)
SODIUM: 142 mmol/L (ref 134–144)
TOTAL PROTEIN: 7.2 g/dL (ref 6.0–8.5)

## 2017-01-07 LAB — LIPID PANEL
CHOL/HDL RATIO: 3.2 ratio (ref 0.0–5.0)
Cholesterol, Total: 121 mg/dL (ref 100–199)
HDL: 38 mg/dL — ABNORMAL LOW (ref >39)
LDL Calculated: 59 mg/dL (ref 0–99)
Triglycerides: 121 mg/dL (ref 0–149)
VLDL Cholesterol Cal: 24 mg/dL (ref 5–40)

## 2017-01-29 IMAGING — DX DG ABDOMEN 1V
1 series · 1 of 1 positions shown · non-contrast
Comparison: Abdomen x-rays dated 05/15/2015 and 04/11/2015.
Comparison is also made to CT abdomen dated 04/22/2015.

CLINICAL DATA: Left-sided stones. History of lithotripsy x2, last
[REDACTED].

EXAM:
ABDOMEN - 1 VIEW

[abdomen kub]
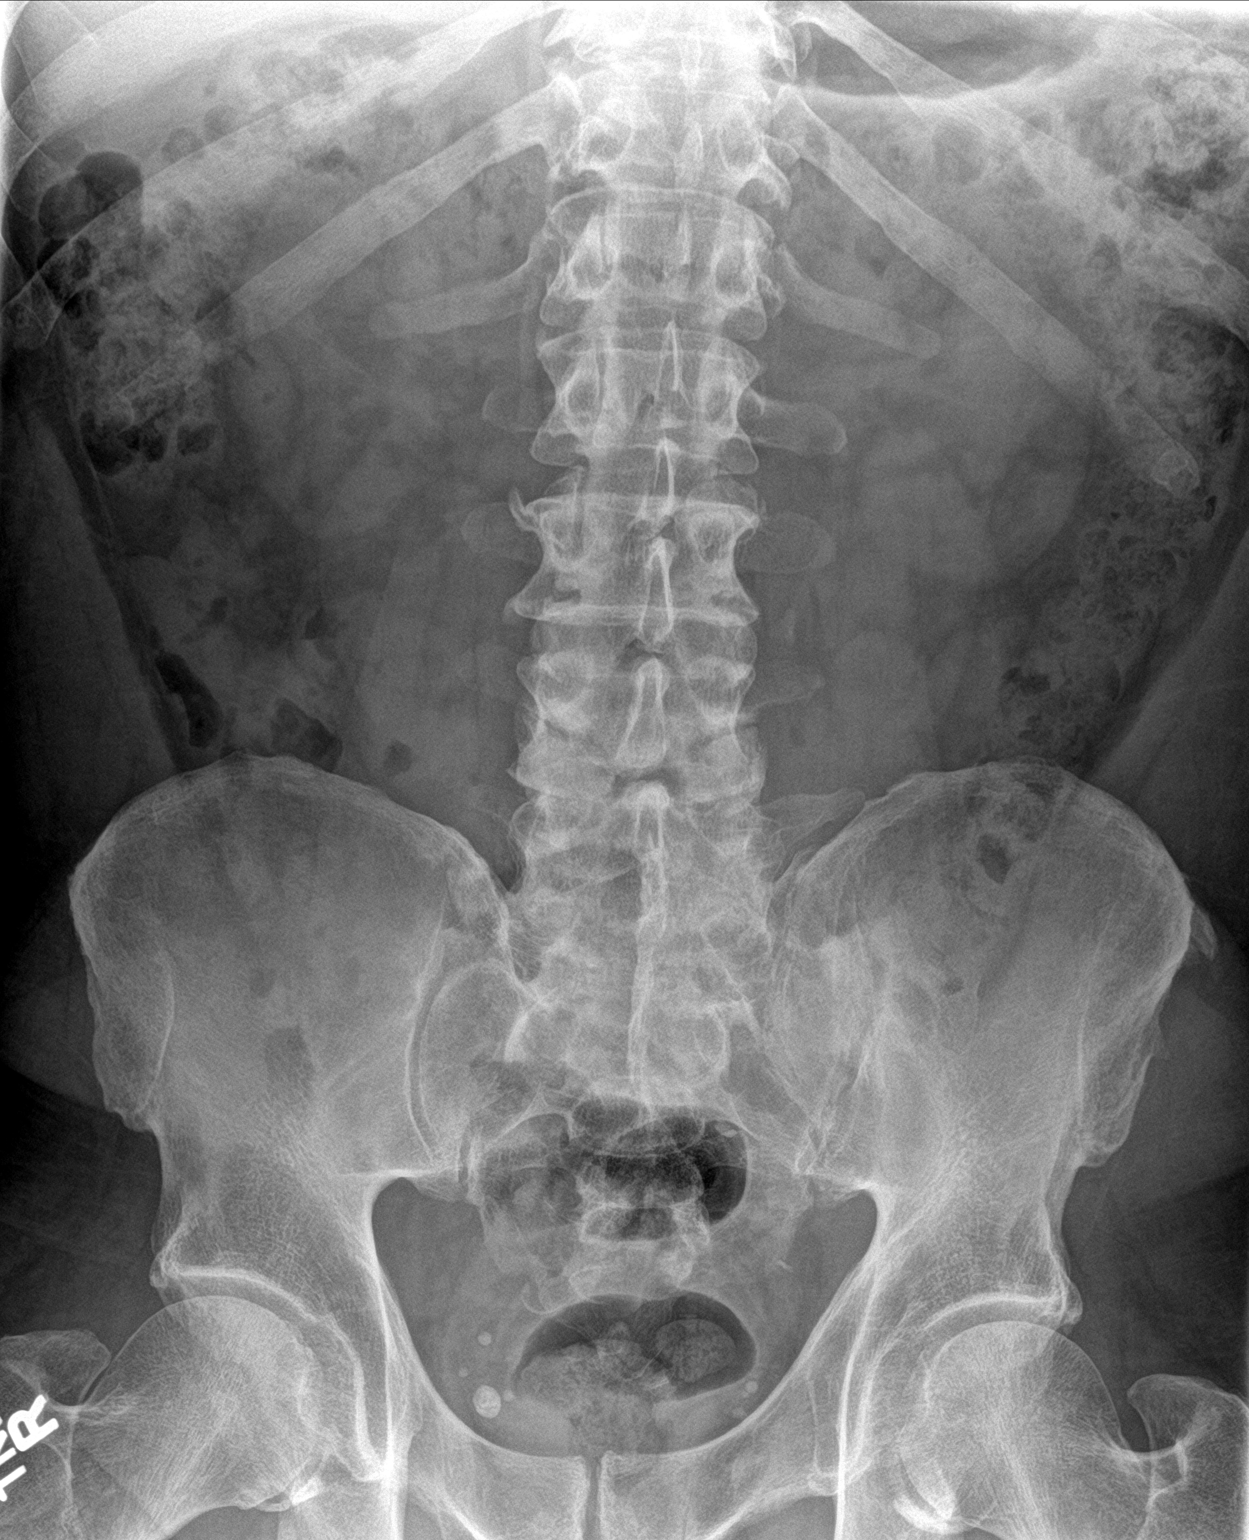

[1 of 1 positions shown; findings below may reference images not displayed]

FINDINGS: There are 4 lumbar type vertebral bodies. Based on this numbering,
there is an 8 mm calcification again seen just to the left of the
L2-3 disc space, unchanged in position, compatible with the site of
an intraureteral stone on CT abdomen of 04/22/2015.

There is no renal stone identified on today's exam.

Visualized bowel gas pattern is nonobstructive. Fairly large amount
of stool is again seen throughout the nondistended colon. No
evidence of soft tissue mass or abnormal fluid collection.
Additional calcifications in the lower pelvis are compatible with
benign vascular phleboliths.
IMPRESSION: 1. Calcification again seen just to the left of the L2-3 disc space,
measuring 8 mm, stable in position compared to previous abdominal
x-rays of 05/15/2015 and 04/11/2015, compatible with the site of an
intraureteral stone seen on CT abdomen of 04/22/2015.
2. No renal stone identified.

## 2017-01-31 DIAGNOSIS — I10 Essential (primary) hypertension: Secondary | ICD-10-CM | POA: Diagnosis not present

## 2017-01-31 DIAGNOSIS — E119 Type 2 diabetes mellitus without complications: Secondary | ICD-10-CM | POA: Diagnosis not present

## 2017-01-31 DIAGNOSIS — J0100 Acute maxillary sinusitis, unspecified: Secondary | ICD-10-CM | POA: Diagnosis not present

## 2017-01-31 DIAGNOSIS — Z719 Counseling, unspecified: Secondary | ICD-10-CM | POA: Diagnosis not present

## 2017-01-31 DIAGNOSIS — Z008 Encounter for other general examination: Secondary | ICD-10-CM | POA: Diagnosis not present

## 2017-03-20 ENCOUNTER — Other Ambulatory Visit: Payer: Self-pay | Admitting: Nurse Practitioner

## 2017-03-20 DIAGNOSIS — E876 Hypokalemia: Secondary | ICD-10-CM

## 2017-04-08 ENCOUNTER — Ambulatory Visit: Payer: BLUE CROSS/BLUE SHIELD | Admitting: Nurse Practitioner

## 2017-04-08 ENCOUNTER — Encounter: Payer: Self-pay | Admitting: Nurse Practitioner

## 2017-04-08 VITALS — BP 118/79 | HR 67 | Temp 98.4°F | Ht 67.0 in | Wt 224.0 lb

## 2017-04-08 DIAGNOSIS — M549 Dorsalgia, unspecified: Secondary | ICD-10-CM | POA: Diagnosis not present

## 2017-04-08 DIAGNOSIS — G8929 Other chronic pain: Secondary | ICD-10-CM | POA: Diagnosis not present

## 2017-04-08 DIAGNOSIS — Z6829 Body mass index (BMI) 29.0-29.9, adult: Secondary | ICD-10-CM

## 2017-04-08 DIAGNOSIS — E782 Mixed hyperlipidemia: Secondary | ICD-10-CM | POA: Diagnosis not present

## 2017-04-08 DIAGNOSIS — I10 Essential (primary) hypertension: Secondary | ICD-10-CM | POA: Diagnosis not present

## 2017-04-08 DIAGNOSIS — E785 Hyperlipidemia, unspecified: Secondary | ICD-10-CM | POA: Diagnosis not present

## 2017-04-08 DIAGNOSIS — E119 Type 2 diabetes mellitus without complications: Secondary | ICD-10-CM

## 2017-04-08 DIAGNOSIS — E876 Hypokalemia: Secondary | ICD-10-CM | POA: Diagnosis not present

## 2017-04-08 LAB — BAYER DCA HB A1C WAIVED: HB A1C (BAYER DCA - WAIVED): 6.2 % (ref <7.0)

## 2017-04-08 MED ORDER — GLIPIZIDE ER 5 MG PO TB24: 5.0000 mg | ORAL_TABLET | Freq: Every day | ORAL | 1 refills | Status: DC

## 2017-04-08 MED ORDER — POTASSIUM CITRATE ER 15 MEQ (1620 MG) PO TBCR: 2.0000 | EXTENDED_RELEASE_TABLET | Freq: Two times a day (BID) | ORAL | 1 refills | Status: DC

## 2017-04-08 MED ORDER — ATORVASTATIN CALCIUM 10 MG PO TABS: 10.0000 mg | ORAL_TABLET | Freq: Every day | ORAL | 1 refills | Status: DC

## 2017-04-08 MED ORDER — LISINOPRIL 10 MG PO TABS: 10.0000 mg | ORAL_TABLET | Freq: Every day | ORAL | 1 refills | Status: DC

## 2017-04-08 NOTE — Patient Instructions (Signed)

## 2017-04-08 NOTE — Progress Notes (Signed)
Subjective:    Patient ID: Cameron Noble, male    DOB: 1960/10/14, 57 y.o.   MRN: [id number]  HPI   Aedon Deason is here today for follow up of chronic medical problem.  Outpatient Encounter Medications as of 04/08/2017  Medication Sig  . aspirin 81 MG tablet Take 81 mg by mouth daily.  Marland Kitchen atorvastatin (LIPITOR) 10 MG tablet Take 1 tablet (10 mg total) by mouth daily.  . Cholecalciferol (VITAMIN D-3 PO) Take 1 tablet by mouth daily.  . fish oil-omega-3 fatty acids 1000 MG capsule Take 2 g by mouth daily.  Marland Kitchen glipiZIDE (GLUCOTROL XL) 5 MG 24 hr tablet Take 1 tablet (5 mg total) by mouth daily with breakfast.  . lisinopril (PRINIVIL,ZESTRIL) 10 MG tablet Take 1 tablet (10 mg total) by mouth daily.  . meloxicam (MOBIC) 7.5 MG tablet Take 1 tablet (7.5 mg total) by mouth daily.  . Potassium Citrate 15 MEQ (1620 MG) TBCR Take 2 tablets by mouth 2 (two) times daily.  . Potassium Citrate 15 MEQ (1620 MG) TBCR TAKE 2 TABLETS BY MOUTH TWICE DAILY     1. Essential hypertension, benign  No c/o chest pain, sob or headache. Does not check blood pressure at home. BP Readings from Last 3 Encounters:  01/06/17 125/78  09/17/16 126/88  06/15/16 131/82     2. Diabetes mellitus due to underlying condition with ketoacidosis without coma, without long-term current use of insulin ([place]) last Hgba1c was 6.0%. He does snot check blood sugars everyday. No hypoglycemia he is aware of  3. BMI 29.0-29.9,adult no recent weight changes  4. Chronic back pain, unspecified back location, unspecified back pain laterality Still has back pain. Had back surgery years ago. Has some bulging disc and has had seroid shots which really did not help. Pain radiates to buttocks. He says he can tolerate it. Cannot afford to be out of work to have anything done right now.   5. Hyperlipidemia with target LDL less than 100  Does not really watch diet  6. Hypokalemia  No c/o lower ext cramping    New complaints: None  today  Social history: Lives with wife of 32 years   Review of Systems  Constitutional: Negative for activity change and appetite change.  HENT: Negative.   Eyes: Negative for pain.  Respiratory: Negative for shortness of breath.   Cardiovascular: Negative for chest pain, palpitations and leg swelling.  Gastrointestinal: Negative for abdominal pain.  Endocrine: Negative for polydipsia.  Genitourinary: Negative.   Skin: Negative for rash.  Neurological: Negative for dizziness, weakness and headaches.  Hematological: Does not bruise/bleed easily.  Psychiatric/Behavioral: Negative.   All other systems reviewed and are negative.      Objective:   Physical Exam  Constitutional: He is oriented to person, place, and time. He appears well-developed and well-nourished.  HENT:  Head: Normocephalic.  Right Ear: External ear normal.  Left Ear: External ear normal.  Nose: Nose normal.  Mouth/Throat: Oropharynx is clear and moist.  Eyes: Pupils are equal, round, and reactive to light. EOM are normal.  Neck: Normal range of motion. Neck supple. No JVD present. No thyromegaly present.  Cardiovascular: Normal rate, regular rhythm, normal heart sounds and intact distal pulses. Exam reveals no gallop and no friction rub.  No murmur heard. Pulmonary/Chest: Effort normal and breath sounds normal. No respiratory distress. He has no wheezes. He has no rales. He exhibits no tenderness.  Abdominal: Soft. Bowel sounds are normal. He exhibits no mass. There  is no tenderness.  Genitourinary: Prostate normal and penis normal.  Musculoskeletal: Normal range of motion. He exhibits no edema.  Lymphadenopathy:    He has no cervical adenopathy.  Neurological: He is alert and oriented to person, place, and time. No cranial nerve deficit.  Skin: Skin is warm and dry.  Psychiatric: He has a normal mood and affect. His behavior is normal. Judgment and thought content normal.   BP 118/79   Pulse 67   Temp  98.4 F (36.9 C) (Oral)   Ht 5' 7" (1.702 m)   Wt 224 lb (101.6 kg)   BMI 35.08 kg/m   HGBA1c 6.2%    Assessment & Plan:  1. Essential hypertension, benign [place] sodium diet - CMP14+EGFR - lisinopril (PRINIVIL,ZESTRIL) 10 MG tablet; Take 1 tablet (10 mg total) by mouth daily.  Dispense: 90 tablet; Refill: 1  2. Type 2 diabetes mellitus without complication, without long-term current use of insulin (HCC) - glipiZIDE (GLUCOTROL XL) 5 MG 24 hr tablet; Take 1 tablet (5 mg total) by mouth daily with breakfast.  Dispense: 90 tablet; Refill: 1 continue to watch carbs in diet - Bayer DCA Hb A1c Waived  3. BMI 29.0-29.9,adult Discussed diet and exercise for person with BMI >25 Will recheck weight in 3-6 months  4. Chronic back pain, unspecified back location, unspecified back pain laterality Back stretches  5. Mixed hyperlipidemia Low fat diet - atorvastatin (LIPITOR) 10 MG tablet; Take 1 tablet (10 mg total) by mouth daily.  Dispense: 90 tablet; Refill: 1 - Lipid panel  6. Hypokalemia - Potassium Citrate 15 MEQ (1620 MG) TBCR; Take 2 tablets by mouth 2 (two) times daily.  Dispense: 120 tablet; Refill: 1      Labs pending Health maintenance reviewed Diet and exercise encouraged Continue all meds Follow up  In 6 months   [place], FNP

## 2017-04-09 LAB — LIPID PANEL
CHOLESTEROL TOTAL: 106 mg/dL (ref 100–199)
Chol/HDL Ratio: 2.7 ratio (ref 0.0–5.0)
HDL: 39 mg/dL — ABNORMAL LOW (ref >39)
LDL CALC: 43 mg/dL (ref 0–99)
Triglycerides: 119 mg/dL (ref 0–149)
VLDL Cholesterol Cal: 24 mg/dL (ref 5–40)

## 2017-04-09 LAB — CMP14+EGFR
ALBUMIN: 4.2 g/dL (ref 3.5–5.5)
ALK PHOS: 102 IU/L (ref 39–117)
ALT: 28 IU/L (ref 0–44)
AST: 21 IU/L (ref 0–40)
Albumin/Globulin Ratio: 1.5 (ref 1.2–2.2)
BILIRUBIN TOTAL: 0.3 mg/dL (ref 0.0–1.2)
BUN / CREAT RATIO: 16 (ref 9–20)
BUN: 21 mg/dL (ref 6–24)
CHLORIDE: 108 mmol/L — AB (ref 96–106)
CO2: 23 mmol/L (ref 20–29)
CREATININE: 1.31 mg/dL — AB (ref 0.76–1.27)
Calcium: 9.7 mg/dL (ref 8.7–10.2)
GFR calc Af Amer: 68 mL/min/{1.73_m2} (ref >59)
GFR calc non Af Amer: 59 mL/min/{1.73_m2} — ABNORMAL LOW (ref >59)
GLOBULIN, TOTAL: 2.8 g/dL (ref 1.5–4.5)
GLUCOSE: 107 mg/dL — AB (ref 65–99)
POTASSIUM: 4.8 mmol/L (ref 3.5–5.2)
SODIUM: 143 mmol/L (ref 134–144)
Total Protein: 7 g/dL (ref 6.0–8.5)

## 2017-04-11 DIAGNOSIS — Z008 Encounter for other general examination: Secondary | ICD-10-CM | POA: Diagnosis not present

## 2017-04-11 DIAGNOSIS — Z719 Counseling, unspecified: Secondary | ICD-10-CM | POA: Diagnosis not present

## 2017-04-11 DIAGNOSIS — I10 Essential (primary) hypertension: Secondary | ICD-10-CM | POA: Diagnosis not present

## 2017-04-11 DIAGNOSIS — E119 Type 2 diabetes mellitus without complications: Secondary | ICD-10-CM | POA: Diagnosis not present

## 2017-06-02 IMAGING — US US RENAL
1 series · 14 of 25 positions shown · non-contrast
Comparison: KUB 02/07/2015.  CT 01/27/2015.

CLINICAL DATA: Kidney stone.

EXAM:
RENAL / URINARY TRACT ULTRASOUND COMPLETE

[Series 1: us renal · 0.21mm/px · 14 of 52 slices shown]
[im 1/52]
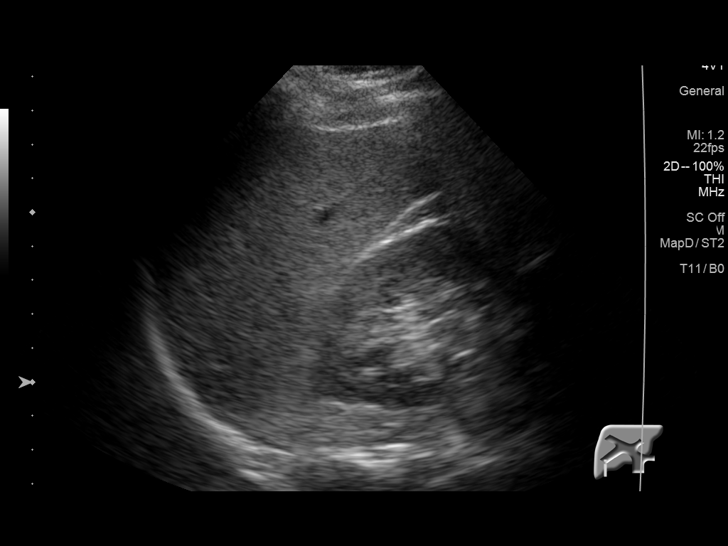
[im 5/52]
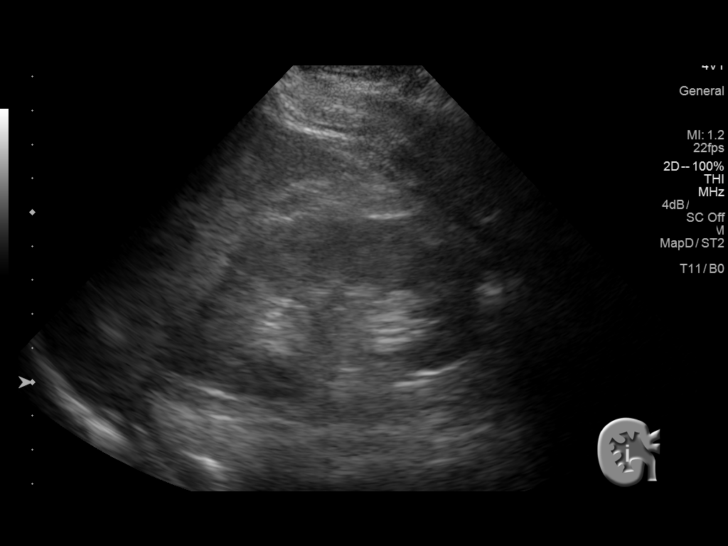
[im 9/52]
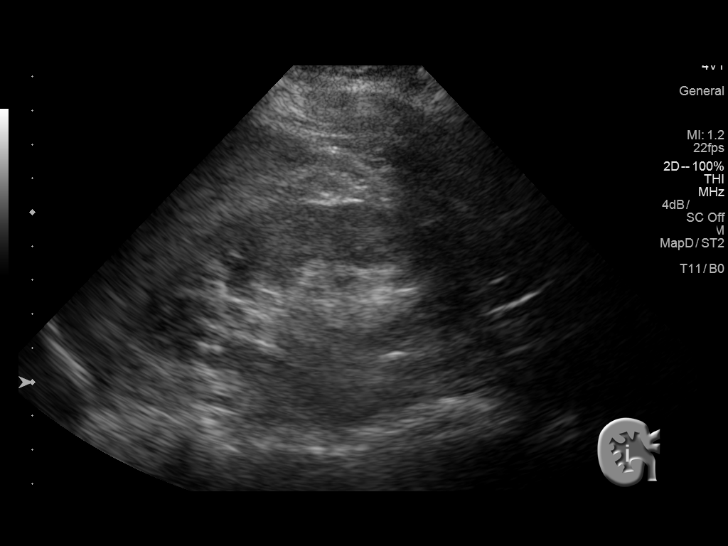
[im 13/52]
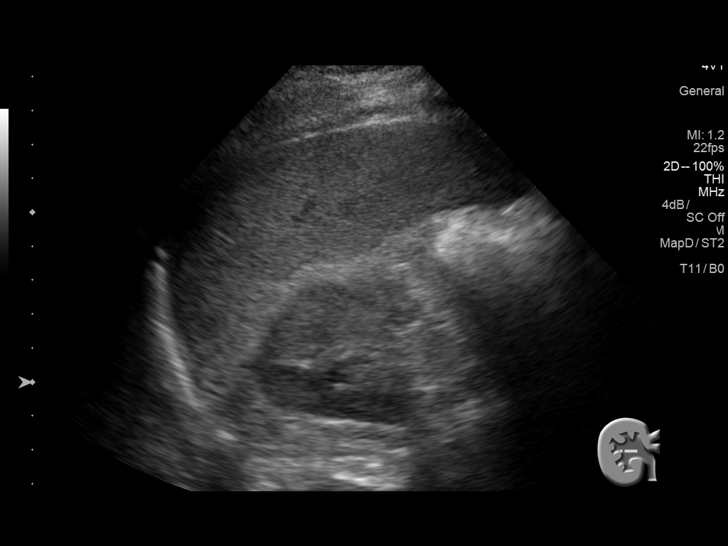
[im 18/52]
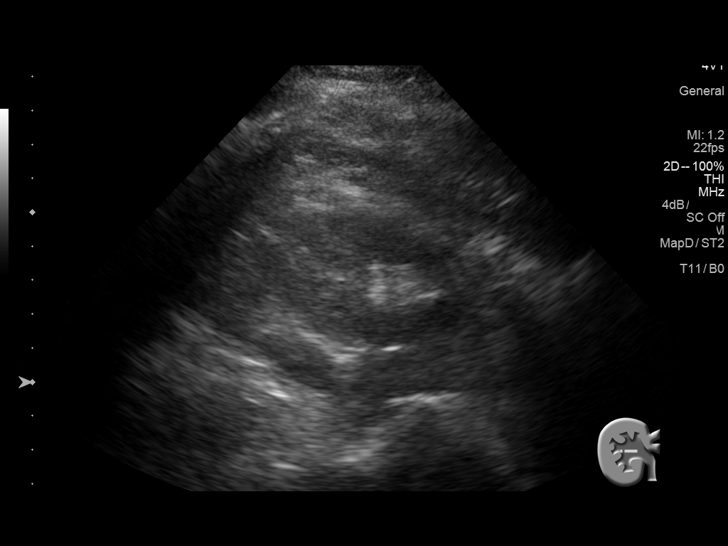
[im 20/52]
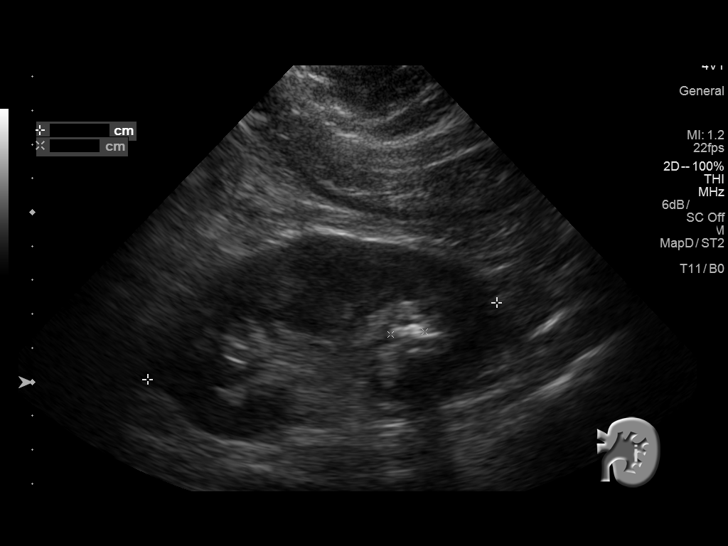
[im 24/52]
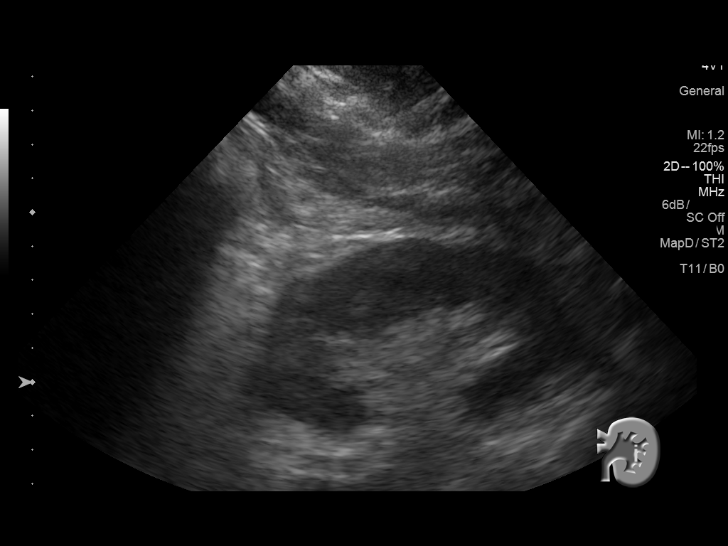
[im 28/52]
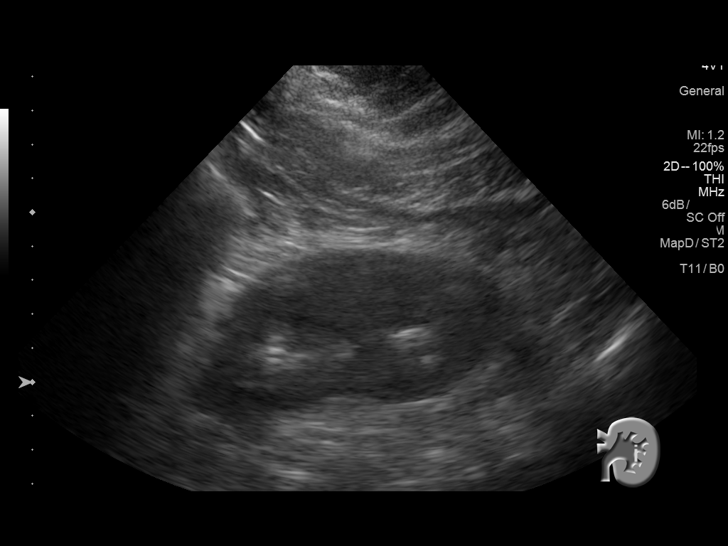
[im 32/52]
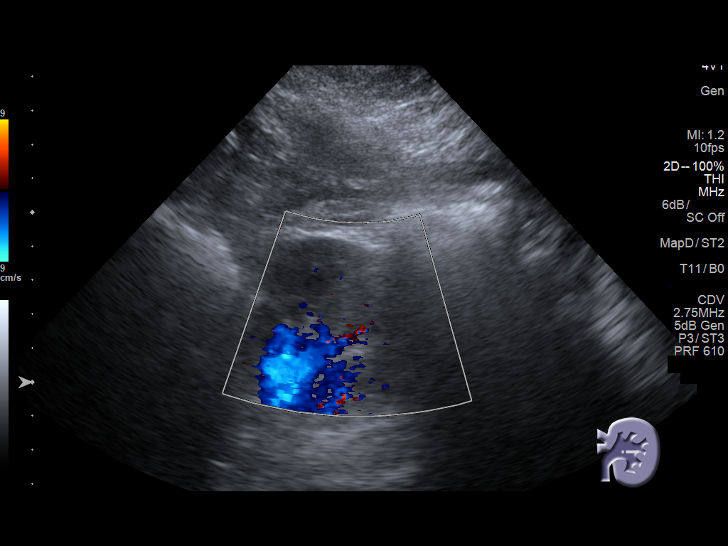
[im 35/52]
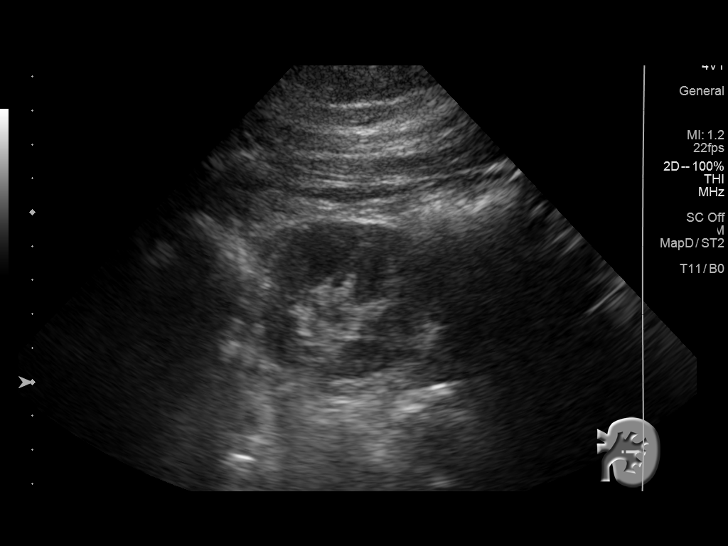
[im 39/52]
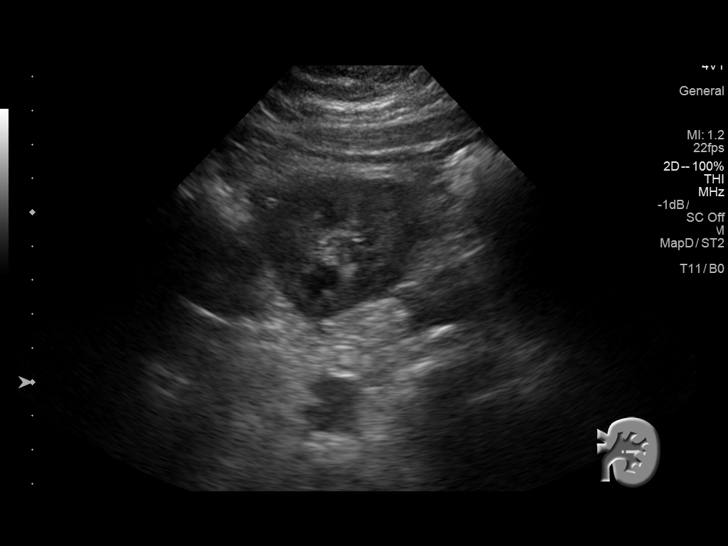
[im 43/52]
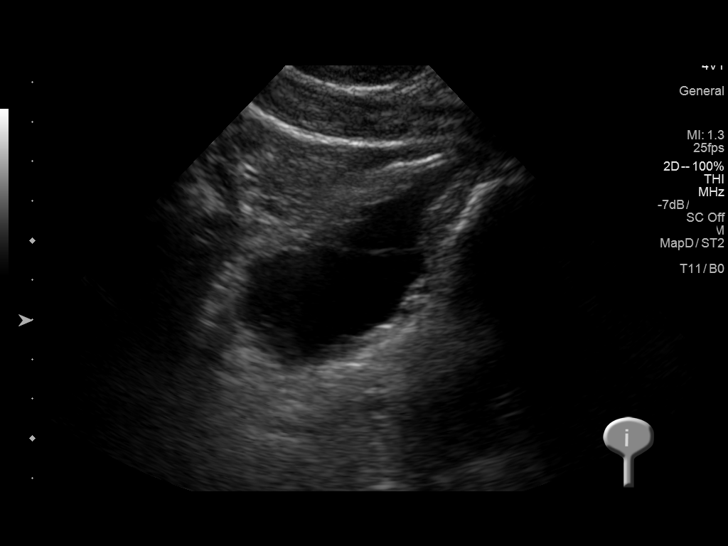
[im 47/52]
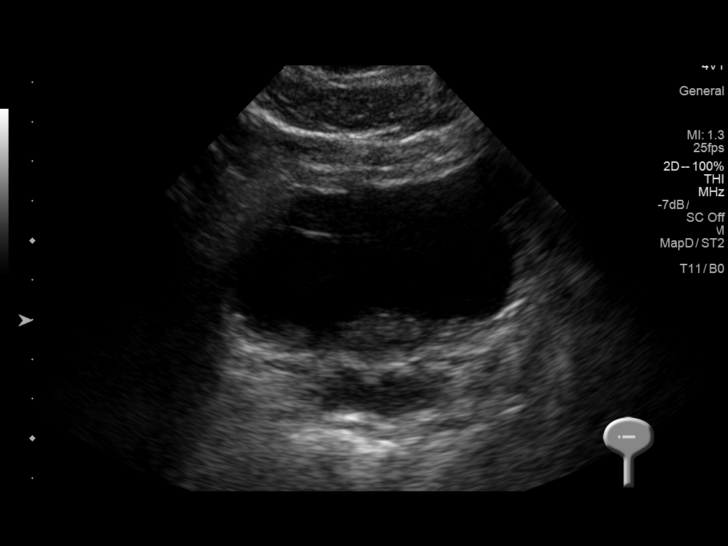
[im 52/52]
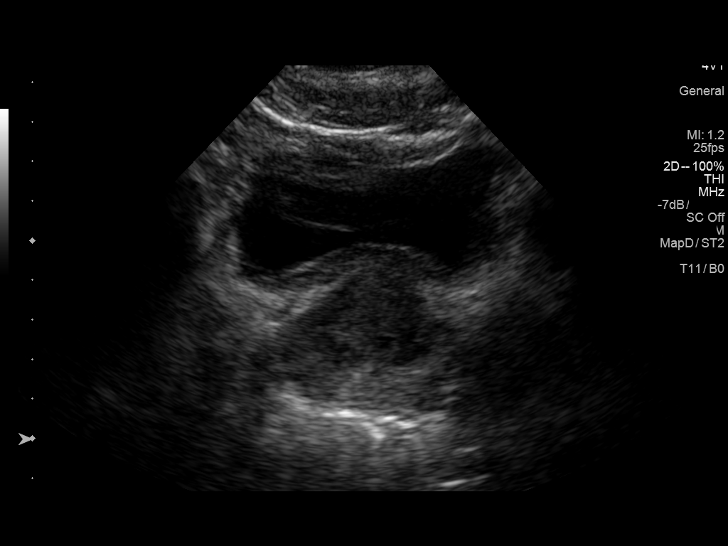

[14 of 25 positions shown; findings below may reference images not displayed]

FINDINGS: Right Kidney:

Length: 10.2 cm. Echogenicity within normal limits. No mass or
hydronephrosis visualized.

Left Kidney:

Length: 10.5 cm. Echogenicity within normal limits. No mass or
hydronephrosis visualized. Probable left lower renal pole calyceal
stone.

Bladder:

Appears normal for degree of bladder distention. Left ureteral jet
not identified. Prostate is enlarged at 4.3 x 4.2 x 5.2 cm
IMPRESSION: 1. Prostate is prominent at 4.3 x 4.2 x 5.2 cm.
2. Nonobstructing left renal calyceal stone. No hydronephrosis noted
on today's exam. No bladder distention. Left ureteral jet not
identified.
3.

## 2017-07-06 ENCOUNTER — Other Ambulatory Visit: Payer: Self-pay | Admitting: Family Medicine

## 2017-07-06 ENCOUNTER — Other Ambulatory Visit: Payer: Self-pay | Admitting: Urology

## 2017-07-06 ENCOUNTER — Ambulatory Visit (HOSPITAL_COMMUNITY)
Admission: RE | Admit: 2017-07-06 | Discharge: 2017-07-06 | Disposition: A | Discharge status: Home / Self Care | Payer: BLUE CROSS/BLUE SHIELD | Source: Ambulatory Visit | Attending: Family Medicine | Admitting: Family Medicine

## 2017-07-06 DIAGNOSIS — N200 Calculus of kidney: Secondary | ICD-10-CM | POA: Diagnosis not present

## 2017-07-06 DIAGNOSIS — Z87442 Personal history of urinary calculi: Secondary | ICD-10-CM | POA: Diagnosis not present

## 2017-07-06 DIAGNOSIS — R52 Pain, unspecified: Secondary | ICD-10-CM

## 2017-07-08 ENCOUNTER — Other Ambulatory Visit (HOSPITAL_COMMUNITY)
Admission: RE | Admit: 2017-07-08 | Discharge: 2017-07-08 | Disposition: A | Discharge status: Home / Self Care | Payer: BLUE CROSS/BLUE SHIELD | Source: Other Acute Inpatient Hospital | Attending: Urology | Admitting: Urology

## 2017-07-08 ENCOUNTER — Ambulatory Visit: Payer: BLUE CROSS/BLUE SHIELD | Admitting: Urology

## 2017-07-08 DIAGNOSIS — Z87442 Personal history of urinary calculi: Secondary | ICD-10-CM

## 2017-07-08 DIAGNOSIS — N3020 Other chronic cystitis without hematuria: Secondary | ICD-10-CM

## 2017-07-10 LAB — URINE CULTURE

## 2017-07-27 DIAGNOSIS — K047 Periapical abscess without sinus: Secondary | ICD-10-CM | POA: Diagnosis not present

## 2017-08-01 DIAGNOSIS — E785 Hyperlipidemia, unspecified: Secondary | ICD-10-CM | POA: Diagnosis not present

## 2017-08-01 DIAGNOSIS — E119 Type 2 diabetes mellitus without complications: Secondary | ICD-10-CM | POA: Diagnosis not present

## 2017-08-01 DIAGNOSIS — Z008 Encounter for other general examination: Secondary | ICD-10-CM | POA: Diagnosis not present

## 2017-08-01 DIAGNOSIS — I10 Essential (primary) hypertension: Secondary | ICD-10-CM | POA: Diagnosis not present

## 2017-08-01 DIAGNOSIS — Z719 Counseling, unspecified: Secondary | ICD-10-CM | POA: Diagnosis not present

## 2017-08-12 ENCOUNTER — Other Ambulatory Visit: Payer: Self-pay | Admitting: Urology

## 2017-08-12 DIAGNOSIS — Z87442 Personal history of urinary calculi: Secondary | ICD-10-CM

## 2017-08-26 ENCOUNTER — Ambulatory Visit (HOSPITAL_COMMUNITY)
Admission: RE | Admit: 2017-08-26 | Discharge: 2017-08-26 | Disposition: A | Discharge status: Home / Self Care | Payer: BLUE CROSS/BLUE SHIELD | Source: Ambulatory Visit | Attending: Urology | Admitting: Urology

## 2017-08-26 DIAGNOSIS — Z87442 Personal history of urinary calculi: Secondary | ICD-10-CM

## 2017-08-26 DIAGNOSIS — N390 Urinary tract infection, site not specified: Secondary | ICD-10-CM | POA: Diagnosis not present

## 2017-10-04 DIAGNOSIS — Z23 Encounter for immunization: Secondary | ICD-10-CM | POA: Diagnosis not present

## 2017-10-10 ENCOUNTER — Ambulatory Visit: Payer: BLUE CROSS/BLUE SHIELD | Admitting: Nurse Practitioner

## 2017-10-14 ENCOUNTER — Encounter: Payer: Self-pay | Admitting: Nurse Practitioner

## 2017-10-14 ENCOUNTER — Ambulatory Visit: Payer: BLUE CROSS/BLUE SHIELD | Admitting: Nurse Practitioner

## 2017-10-14 VITALS — BP 116/76 | HR 71 | Temp 97.9°F | Ht 67.0 in | Wt 212.0 lb

## 2017-10-14 DIAGNOSIS — E119 Type 2 diabetes mellitus without complications: Secondary | ICD-10-CM

## 2017-10-14 DIAGNOSIS — E876 Hypokalemia: Secondary | ICD-10-CM | POA: Diagnosis not present

## 2017-10-14 DIAGNOSIS — E785 Hyperlipidemia, unspecified: Secondary | ICD-10-CM

## 2017-10-14 DIAGNOSIS — M549 Dorsalgia, unspecified: Secondary | ICD-10-CM

## 2017-10-14 DIAGNOSIS — G8929 Other chronic pain: Secondary | ICD-10-CM

## 2017-10-14 DIAGNOSIS — E782 Mixed hyperlipidemia: Secondary | ICD-10-CM

## 2017-10-14 DIAGNOSIS — I10 Essential (primary) hypertension: Secondary | ICD-10-CM | POA: Diagnosis not present

## 2017-10-14 DIAGNOSIS — Z6829 Body mass index (BMI) 29.0-29.9, adult: Secondary | ICD-10-CM

## 2017-10-14 LAB — LIPID PANEL
Chol/HDL Ratio: 2.5 ratio (ref 0.0–5.0)
Cholesterol, Total: 94 mg/dL — ABNORMAL LOW (ref 100–199)
HDL: 38 mg/dL — AB (ref >39)
LDL Calculated: 48 mg/dL (ref 0–99)
Triglycerides: 38 mg/dL (ref 0–149)
VLDL CHOLESTEROL CAL: 8 mg/dL (ref 5–40)

## 2017-10-14 LAB — CMP14+EGFR
ALT: 28 IU/L (ref 0–44)
AST: 24 IU/L (ref 0–40)
Albumin/Globulin Ratio: 1.5 (ref 1.2–2.2)
Albumin: 4.1 g/dL (ref 3.5–5.5)
Alkaline Phosphatase: 101 IU/L (ref 39–117)
BUN/Creatinine Ratio: 13 (ref 9–20)
BUN: 18 mg/dL (ref 6–24)
Bilirubin Total: 0.9 mg/dL (ref 0.0–1.2)
CALCIUM: 9.9 mg/dL (ref 8.7–10.2)
CO2: 22 mmol/L (ref 20–29)
CREATININE: 1.35 mg/dL — AB (ref 0.76–1.27)
Chloride: 103 mmol/L (ref 96–106)
GFR, EST AFRICAN AMERICAN: 66 mL/min/{1.73_m2} (ref >59)
GFR, EST NON AFRICAN AMERICAN: 57 mL/min/{1.73_m2} — AB (ref >59)
GLUCOSE: 111 mg/dL — AB (ref 65–99)
Globulin, Total: 2.8 g/dL (ref 1.5–4.5)
Potassium: 4.3 mmol/L (ref 3.5–5.2)
Sodium: 140 mmol/L (ref 134–144)
TOTAL PROTEIN: 6.9 g/dL (ref 6.0–8.5)

## 2017-10-14 LAB — BAYER DCA HB A1C WAIVED: HB A1C: 6.1 % (ref <7.0)

## 2017-10-14 MED ORDER — GLIPIZIDE ER 5 MG PO TB24: 5.0000 mg | ORAL_TABLET | Freq: Every day | ORAL | 1 refills | Status: DC

## 2017-10-14 MED ORDER — POTASSIUM CITRATE ER 15 MEQ (1620 MG) PO TBCR: 2.0000 | EXTENDED_RELEASE_TABLET | Freq: Two times a day (BID) | ORAL | 1 refills | Status: DC

## 2017-10-14 MED ORDER — LISINOPRIL 10 MG PO TABS: 10.0000 mg | ORAL_TABLET | Freq: Every day | ORAL | 1 refills | Status: DC

## 2017-10-14 MED ORDER — ATORVASTATIN CALCIUM 10 MG PO TABS: 10.0000 mg | ORAL_TABLET | Freq: Every day | ORAL | 1 refills | Status: DC

## 2017-10-14 NOTE — Progress Notes (Signed)
Subjective:    Patient ID: Cameron Noble, male    DOB: [birth year], 57 y.o.   MRN: [id number]   Chief Complaint: medical management of chronic issues  HPI:  1. Type 2 diabetes mellitus without complication, without long-term current use of insulin ([place]) last hgba1c was 6.2%. He has not been checking blood sugars everyday because he is working 2 jobs and just does not take the time.  2. Mixed hyperlipidemia  Watches diet . Stays very active  3. Essential hypertension, benign  No c/o chest pain, sob, or headache. Does not check blood pressure at home.  4. Hypokalemia  No c/o lower ext cramps  6. BMI 29.0-29.9,adult  No recent weight changes  7. Chronic back pain, unspecified back location, unspecified back pain laterality  ratespain 5/10 this morning. Slight numbness and tingling in legs. He takes mobic which helps. He does not want to do anything else today    Outpatient Encounter Medications as of 10/14/2017  Medication Sig  . aspirin 81 MG tablet Take 81 mg by mouth daily.  Marland Kitchen atorvastatin (LIPITOR) 10 MG tablet Take 1 tablet (10 mg total) by mouth daily.  . Cholecalciferol (VITAMIN D-3 PO) Take 1 tablet by mouth daily.  . fish oil-omega-3 fatty acids 1000 MG capsule Take 2 g by mouth daily.  Marland Kitchen glipiZIDE (GLUCOTROL XL) 5 MG 24 hr tablet Take 1 tablet (5 mg total) by mouth daily with breakfast.  . lisinopril (PRINIVIL,ZESTRIL) 10 MG tablet Take 1 tablet (10 mg total) by mouth daily.  . meloxicam (MOBIC) 7.5 MG tablet Take 1 tablet (7.5 mg total) by mouth daily.  . Potassium Citrate 15 MEQ (1620 MG) TBCR Take 2 tablets by mouth 2 (two) times daily.       New complaints: None today  Social history: Lives with his wife. Works 2  [employer] and side job.   Review of Systems  Constitutional: Negative for activity change and appetite change.  HENT: Negative.   Eyes: Negative for pain.  Respiratory: Negative for shortness of breath.   Cardiovascular: Negative for chest pain,  palpitations and leg swelling.  Gastrointestinal: Negative for abdominal pain.  Endocrine: Negative for polydipsia.  Genitourinary: Negative.   Skin: Negative for rash.  Neurological: Negative for dizziness, weakness and headaches.  Hematological: Does not bruise/bleed easily.  Psychiatric/Behavioral: Negative.   All other systems reviewed and are negative.      Objective:   Physical Exam  Constitutional: He is oriented to person, place, and time. He appears well-developed and well-nourished.  HENT:  Head: Normocephalic.  Nose: Nose normal.  Mouth/Throat: Oropharynx is clear and moist.  Eyes: Pupils are equal, round, and reactive to light. EOM are normal.  Neck: Normal range of motion and phonation normal. Neck supple. No JVD present. Carotid bruit is not present. No thyroid mass and no thyromegaly present.  Cardiovascular: Normal rate and regular rhythm.  Pulmonary/Chest: Effort normal and breath sounds normal. No respiratory distress.  Abdominal: Soft. Normal appearance, normal aorta and bowel sounds are normal. There is no tenderness.  Musculoskeletal: Normal range of motion.  Lymphadenopathy:    He has no cervical adenopathy.  Neurological: He is alert and oriented to person, place, and time.  Skin: Skin is warm and dry.  Psychiatric: He has a normal mood and affect. His behavior is normal. Judgment and thought content normal.  Nursing note and vitals reviewed.  BP 116/76   Pulse 71   Temp 97.9 F (36.6 C) (Oral)   Ht  5' 7" (1.702 m)   Wt 212 lb (96.2 kg)   BMI 33.20 kg/m   hgba1c 6.1%      Assessment & Plan:  Cameron Noble comes in today with chief complaint of Medical Management of Chronic Issues   Diagnosis and orders addressed:  1. Type 2 diabetes mellitus without complication, without long-term current use of insulin (HCC) Continue to watch carbs in diet - Bayer DCA Hb A1c Waived - glipiZIDE (GLUCOTROL XL) 5 MG 24 hr tablet; Take 1 tablet (5 mg total) by  mouth daily with breakfast.  Dispense: 90 tablet; Refill: 1  2. Mixed hyperlipidemia Low fat diet - Lipid panel - atorvastatin (LIPITOR) 10 MG tablet; Take 1 tablet (10 mg total) by mouth daily.  Dispense: 90 tablet; Refill: 1  3. Essential hypertension, benign Low sodium diet - CMP14+EGFR - lisinopril (PRINIVIL,ZESTRIL) 10 MG tablet; Take 1 tablet (10 mg total) by mouth daily.  Dispense: 90 tablet; Refill: 1  4. Hypokalemia - Potassium Citrate 15 MEQ (1620 MG) TBCR; Take 2 tablets by mouth 2 (two) times daily.  Dispense: 120 tablet; Refill: 1  5. BMI 29.0-29.9,adult Discussed diet and exercise for person with BMI >25 Will recheck weight in 3-6 months  7. Chronic back pain, unspecified back location, unspecified back pain laterality Patient wants to continue current routine.  He will let us know when he needs referal.   Labs pending Health Maintenance reviewed Diet and exercise encouraged  Follow up plan: 6 months   [place], FNP

## 2017-10-14 NOTE — Patient Instructions (Signed)

## 2017-10-25 ENCOUNTER — Encounter: Payer: Self-pay | Admitting: *Deleted

## 2017-11-02 DIAGNOSIS — I10 Essential (primary) hypertension: Secondary | ICD-10-CM | POA: Diagnosis not present

## 2017-11-02 DIAGNOSIS — E119 Type 2 diabetes mellitus without complications: Secondary | ICD-10-CM | POA: Diagnosis not present

## 2017-11-02 DIAGNOSIS — Z719 Counseling, unspecified: Secondary | ICD-10-CM | POA: Diagnosis not present

## 2017-11-02 DIAGNOSIS — Z008 Encounter for other general examination: Secondary | ICD-10-CM | POA: Diagnosis not present

## 2017-12-20 ENCOUNTER — Ambulatory Visit: Payer: BLUE CROSS/BLUE SHIELD | Admitting: Family Medicine

## 2017-12-20 ENCOUNTER — Encounter: Payer: Self-pay | Admitting: Family Medicine

## 2017-12-20 VITALS — BP 124/78 | HR 78 | Temp 98.5°F | Ht 67.0 in | Wt 211.0 lb

## 2017-12-20 DIAGNOSIS — J40 Bronchitis, not specified as acute or chronic: Secondary | ICD-10-CM

## 2017-12-20 MED ORDER — AZITHROMYCIN 250 MG PO TABS: ORAL_TABLET | ORAL | 0 refills | Status: DC

## 2017-12-20 NOTE — Progress Notes (Signed)
BP 124/78   Pulse 78   Temp 98.5 F (36.9 C) (Oral)   Ht 5\' 7"  (1.702 m)   Wt 211 lb (95.7 kg)   SpO2 97%   BMI 33.05 kg/m    Subjective:    Patient ID: Cameron Noble, male    DOB: [birth year], 57 y.o.   MRN: [id number]  HPI: Cameron Noble is a 57 y.o. male presenting on 12/20/2017 for Nasal Congestion (x 3 weeks but has improved. OTC mucinex); Cough; Sore Throat; chest congestion; and Abdominal Pain   HPI Sore throat and chest congestion and cough Patient comes in complaining of sore throat and cough and chest congestion that started about 3 weeks ago is a sore throat and a cough but that has developed into the chest congestion.  He has been trying to use over-the-counter medicine such as Mucinex which has helped improve the cough but now is developed into the chest congestion.  He has been working 2 jobs tirelessly and wanted him is in a colder environment with the weather change which she feels like has been affecting him.  Patient also complains of some upper abdominal pain and lower rib pain that hurts more with coughing.  Relevant past medical, surgical, family and social history reviewed and updated as indicated. Interim medical history since our last visit reviewed. Allergies and medications reviewed and updated.  Review of Systems  Constitutional: Negative for chills and fever.  HENT: Positive for congestion, postnasal drip, rhinorrhea, sinus pressure, sneezing and sore throat. Negative for ear discharge, ear pain and voice change.   Eyes: Negative for pain, discharge, redness and visual disturbance.  Respiratory: Positive for cough. Negative for shortness of breath and wheezing.   Cardiovascular: Negative for chest pain and leg swelling.  Musculoskeletal: Negative for gait problem.  Skin: Negative for rash.  All other systems reviewed and are negative.   Per HPI unless specifically indicated above   Allergies as of 12/20/2017   No Known Allergies     Medication  List        Accurate as of 12/20/17  3:22 PM. Always use your most recent med list.          aspirin 81 MG tablet Take 81 mg by mouth daily.   atorvastatin 10 MG tablet Commonly known as:  LIPITOR Take 1 tablet (10 mg total) by mouth daily.   azithromycin 250 MG tablet Commonly known as:  ZITHROMAX Take 2 the first day and then one each day after.   fish oil-omega-3 fatty acids 1000 MG capsule Take 2 g by mouth daily.   glipiZIDE 5 MG 24 hr tablet Commonly known as:  GLUCOTROL XL Take 1 tablet (5 mg total) by mouth daily with breakfast.   lisinopril 10 MG tablet Commonly known as:  PRINIVIL,ZESTRIL Take 1 tablet (10 mg total) by mouth daily.   Potassium Citrate 15 MEQ (1620 MG) Tbcr Take 2 tablets by mouth 2 (two) times daily.   VITAMIN D-3 PO Take 1 tablet by mouth daily.          Objective:    BP 124/78   Pulse 78   Temp 98.5 F (36.9 C) (Oral)   Ht 5\' 7"  (1.702 m)   Wt 211 lb (95.7 kg)   SpO2 97%   BMI 33.05 kg/m   Wt Readings from Last 3 Encounters:  12/20/17 211 lb (95.7 kg)  10/14/17 212 lb (96.2 kg)  04/08/17 224 lb (101.6 kg)    Physical Exam  Constitutional: He is oriented to person, place, and time. He appears well-developed and well-nourished. No distress.  HENT:  Right Ear: Tympanic membrane, external ear and ear canal normal.  Left Ear: Tympanic membrane, external ear and ear canal normal.  Nose: Mucosal edema and rhinorrhea present. No sinus tenderness. No epistaxis. Right sinus exhibits maxillary sinus tenderness. Right sinus exhibits no frontal sinus tenderness. Left sinus exhibits maxillary sinus tenderness. Left sinus exhibits no frontal sinus tenderness.  Mouth/Throat: Uvula is midline and mucous membranes are normal. Posterior oropharyngeal edema present. No oropharyngeal exudate, posterior oropharyngeal erythema or tonsillar abscesses.  Eyes: Conjunctivae are normal. No scleral icterus.  Neck: Neck supple. No thyromegaly present.    Cardiovascular: Normal rate, regular rhythm, normal heart sounds and intact distal pulses.  No murmur heard. Pulmonary/Chest: Effort normal and breath sounds normal. No respiratory distress. He has no wheezes. He has no rales.  Musculoskeletal: Normal range of motion. He exhibits no edema.  Lymphadenopathy:    He has no cervical adenopathy.  Neurological: He is alert and oriented to person, place, and time. Coordination normal.  Skin: Skin is warm and dry. No rash noted. He is not diaphoretic.  Psychiatric: He has a normal mood and affect. His behavior is normal.  Nursing note and vitals reviewed.       Assessment & Plan:   Problem List Items Addressed This Visit    None    Visit Diagnoses    Bronchitis    -  Primary   Relevant Medications   azithromycin (ZITHROMAX) 250 MG tablet       Follow up plan: Return if symptoms worsen or fail to improve.  Counseling provided for all of the vaccine components No orders of the defined types were placed in this encounter.   Caryl Pina, MD [facility] 12/20/2017, 3:22 PM

## 2017-12-25 ENCOUNTER — Other Ambulatory Visit: Payer: Self-pay | Admitting: Nurse Practitioner

## 2017-12-25 DIAGNOSIS — E876 Hypokalemia: Secondary | ICD-10-CM

## 2017-12-26 NOTE — Telephone Encounter (Signed)
OV 10/14/17 rtc 6 mos

## 2018-01-25 ENCOUNTER — Other Ambulatory Visit: Payer: Self-pay

## 2018-01-25 ENCOUNTER — Telehealth: Payer: Self-pay | Admitting: Nurse Practitioner

## 2018-01-25 DIAGNOSIS — I10 Essential (primary) hypertension: Secondary | ICD-10-CM

## 2018-01-25 DIAGNOSIS — E782 Mixed hyperlipidemia: Secondary | ICD-10-CM

## 2018-01-25 DIAGNOSIS — E119 Type 2 diabetes mellitus without complications: Secondary | ICD-10-CM

## 2018-01-25 MED ORDER — ATORVASTATIN CALCIUM 10 MG PO TABS: 10.0000 mg | ORAL_TABLET | Freq: Every day | ORAL | 0 refills | Status: DC

## 2018-01-25 MED ORDER — LISINOPRIL 10 MG PO TABS: 10.0000 mg | ORAL_TABLET | Freq: Every day | ORAL | 0 refills | Status: DC

## 2018-01-25 MED ORDER — GLIPIZIDE ER 5 MG PO TB24: 5.0000 mg | ORAL_TABLET | Freq: Every day | ORAL | 0 refills | Status: DC

## 2018-01-25 NOTE — Telephone Encounter (Signed)
Patient states that he lost his rxs and would like a 30 day supply sent to [facility] until his mail order meds come in. Meds sent to [facility] per patients request

## 2018-01-25 NOTE — Telephone Encounter (Signed)
Left message to call back  

## 2018-01-25 NOTE — Telephone Encounter (Signed)
Spoke with patient and sent meds to pharmacy that he needed. See other telephone encounter

## 2018-01-25 NOTE — Telephone Encounter (Signed)
Spoke with patient and sent in meds needed. See other telephone note

## 2018-03-06 DIAGNOSIS — I10 Essential (primary) hypertension: Secondary | ICD-10-CM | POA: Diagnosis not present

## 2018-03-06 DIAGNOSIS — E785 Hyperlipidemia, unspecified: Secondary | ICD-10-CM | POA: Diagnosis not present

## 2018-03-06 DIAGNOSIS — Z008 Encounter for other general examination: Secondary | ICD-10-CM | POA: Diagnosis not present

## 2018-03-06 DIAGNOSIS — E119 Type 2 diabetes mellitus without complications: Secondary | ICD-10-CM | POA: Diagnosis not present

## 2018-03-21 DIAGNOSIS — M7742 Metatarsalgia, left foot: Secondary | ICD-10-CM | POA: Diagnosis not present

## 2018-03-21 DIAGNOSIS — M7741 Metatarsalgia, right foot: Secondary | ICD-10-CM | POA: Diagnosis not present

## 2018-04-15 ENCOUNTER — Other Ambulatory Visit: Payer: Self-pay | Admitting: Nurse Practitioner

## 2018-04-15 DIAGNOSIS — E876 Hypokalemia: Secondary | ICD-10-CM

## 2018-04-17 DIAGNOSIS — N200 Calculus of kidney: Secondary | ICD-10-CM | POA: Diagnosis not present

## 2018-04-21 ENCOUNTER — Ambulatory Visit: Payer: BLUE CROSS/BLUE SHIELD | Admitting: Nurse Practitioner

## 2018-04-24 ENCOUNTER — Ambulatory Visit: Payer: BLUE CROSS/BLUE SHIELD | Admitting: Nurse Practitioner

## 2018-04-28 ENCOUNTER — Encounter: Payer: Self-pay | Admitting: Nurse Practitioner

## 2018-04-28 ENCOUNTER — Ambulatory Visit (INDEPENDENT_AMBULATORY_CARE_PROVIDER_SITE_OTHER): Payer: BLUE CROSS/BLUE SHIELD | Admitting: Nurse Practitioner

## 2018-04-28 ENCOUNTER — Other Ambulatory Visit: Payer: Self-pay

## 2018-04-28 DIAGNOSIS — Z6829 Body mass index (BMI) 29.0-29.9, adult: Secondary | ICD-10-CM

## 2018-04-28 DIAGNOSIS — E1140 Type 2 diabetes mellitus with diabetic neuropathy, unspecified: Secondary | ICD-10-CM

## 2018-04-28 DIAGNOSIS — E876 Hypokalemia: Secondary | ICD-10-CM

## 2018-04-28 DIAGNOSIS — E782 Mixed hyperlipidemia: Secondary | ICD-10-CM | POA: Diagnosis not present

## 2018-04-28 DIAGNOSIS — I10 Essential (primary) hypertension: Secondary | ICD-10-CM

## 2018-04-28 DIAGNOSIS — E1142 Type 2 diabetes mellitus with diabetic polyneuropathy: Secondary | ICD-10-CM | POA: Insufficient documentation

## 2018-04-28 MED ORDER — LISINOPRIL 10 MG PO TABS: 10.0000 mg | ORAL_TABLET | Freq: Every day | ORAL | 1 refills | Status: DC

## 2018-04-28 MED ORDER — POTASSIUM CITRATE ER 15 MEQ (1620 MG) PO TBCR: 2.0000 | EXTENDED_RELEASE_TABLET | Freq: Two times a day (BID) | ORAL | Status: DC

## 2018-04-28 MED ORDER — ATORVASTATIN CALCIUM 10 MG PO TABS: 10.0000 mg | ORAL_TABLET | Freq: Every day | ORAL | 1 refills | Status: DC

## 2018-04-28 MED ORDER — GLIPIZIDE ER 5 MG PO TB24: 5.0000 mg | ORAL_TABLET | Freq: Every day | ORAL | 1 refills | Status: DC

## 2018-04-28 NOTE — Progress Notes (Signed)
Patient ID: Cameron Noble, male   DOB: 12-17-1960, 58 y.o.   MRN: [id number]    Virtual Visit via telephone Note  I connected with Cameron Noble on 04/28/18 at 8:15AM by telephone and verified that I am speaking with the correct person using two identifiers. Cameron Noble is currently located at home and no one is currently with her during visit. The provider, [name], FNP is located in their office at time of visit.  I discussed the limitations, risks, security and privacy concerns of performing an evaluation and management service by telephone and the availability of in person appointments. I also discussed with the patient that there may be a patient responsible charge related to this service. The patient expressed understanding and agreed to proceed.   History and Present Illness:   Chief Complaint: Medical Management of Chronic Issues    HPI:  1. Essential hypertension, benign No c.o chest pain, sob or headache. Does not check blood pressure at home. BP Readings from Last 3 Encounters:  12/20/17 124/78  10/14/17 116/76  04/08/17 118/79     2. Hyperlipidemia with target LDL less than 100 Does not watch diet and does not exercise  3. Hypokalemia No problems. denies any lower ext cramping  4. Diabetes mellitus due to underlying condition with ketoacidosis without coma, without long-term current use of insulin (HCC) Last HGBA!C was 6.1%. His fasting blood sugars are running around 110-140. He denies any hypoglycemia  5. BMI 29.0-29.9,adult No weight changes    Outpatient Encounter Medications as of 04/28/2018  Medication Sig  . aspirin 81 MG tablet Take 81 mg by mouth daily.  Marland Kitchen atorvastatin (LIPITOR) 10 MG tablet Take 1 tablet (10 mg total) by mouth daily.  Marland Kitchen azithromycin (ZITHROMAX) 250 MG tablet Take 2 the first day and then one each day after.  . Cholecalciferol (VITAMIN D-3 PO) Take 1 tablet by mouth daily.  . fish oil-omega-3 fatty acids 1000 MG capsule  Take 2 g by mouth daily.  Marland Kitchen glipiZIDE (GLUCOTROL XL) 5 MG 24 hr tablet Take 1 tablet (5 mg total) by mouth daily with breakfast.  . lisinopril (PRINIVIL,ZESTRIL) 10 MG tablet Take 1 tablet (10 mg total) by mouth daily.  . Potassium Citrate 15 MEQ (1620 MG) TBCR Take 2 tablets by mouth twice daily     New complaints: Was recently dx with neuropathy bil feet- he was dx by foot doctor and was given gabapentin  Social history: Works 2 jobs and is still working one of the jobs. [employer] has layed off until APril 27,2020     Review of Systems  Constitutional: Negative for diaphoresis and weight loss.  Eyes: Negative for blurred vision, double vision and pain.  Respiratory: Negative for shortness of breath.   Cardiovascular: Negative for chest pain, palpitations, orthopnea and leg swelling.  Gastrointestinal: Negative for abdominal pain.  Genitourinary: Negative.   Skin: Negative for rash.  Neurological: Negative for dizziness, sensory change, loss of consciousness, weakness and headaches.  Endo/Heme/Allergies: Negative for polydipsia. Does not bruise/bleed easily.  Psychiatric/Behavioral: Negative for memory loss. The patient does not have insomnia.   All other systems reviewed and are negative.    Observations/Objective: Alert and oriented No distress noted  Assessment and Plan: [name] comes in today with chief complaint of Medical Management of Chronic Issues   Diagnosis and orders addressed:  1. Essential hypertension, benign Low sodium diet - lisinopril (ZESTRIL) 10 MG tablet; Take 1 tablet (10 mg total) by mouth daily.  Dispense: 90  tablet; Refill: 1  2. Hypokalemia - Potassium Citrate 15 MEQ (1620 MG) TBCR; Take 2 tablets by mouth 2 (two) times daily.  Dispense: 360 tablet; Refill: 01  3. BMI 29.0-29.9,adult Discussed diet and exercise for person with BMI >25 Will recheck weight in 3-6 months  4. Mixed hyperlipidemia Low fat diet - atorvastatin (LIPITOR) 10  MG tablet; Take 1 tablet (10 mg total) by mouth daily.  Dispense: 90 tablet; Refill: 1  5. Type 2 diabetes mellitus with diabetic neuropathy, without long-term current use of insulin (HCC) Continue to watch carbs in diet - glipiZIDE (GLUCOTROL XL) 5 MG 24 hr tablet; Take 1 tablet (5 mg total) by mouth daily with breakfast.  Dispense: 90 tablet; Refill: 1  Previous lab results reviewed Health Maintenance reviewed Diet and exercise encouraged  Follow up plan: 3 months     I discussed the assessment and treatment plan with the patient. The patient was provided an opportunity to ask questions and all were answered. The patient agreed with the plan and demonstrated an understanding of the instructions.   The patient was advised to call back or seek an in-person evaluation if the symptoms worsen or if the condition fails to improve as anticipated.  The above assessment and management plan was discussed with the patient. The patient verbalized understanding of and has agreed to the management plan. Patient is aware to call the clinic if symptoms persist or worsen. Patient is aware when to return to the clinic for a follow-up visit. Patient educated on when it is appropriate to go to the emergency department.    I provided 12 minutes of non-face-to-face time during this encounter.    [name], FNP

## 2018-05-02 DIAGNOSIS — M79671 Pain in right foot: Secondary | ICD-10-CM | POA: Diagnosis not present

## 2018-05-02 DIAGNOSIS — M7741 Metatarsalgia, right foot: Secondary | ICD-10-CM | POA: Diagnosis not present

## 2018-06-13 ENCOUNTER — Other Ambulatory Visit (HOSPITAL_COMMUNITY): Payer: Self-pay | Admitting: Urology

## 2018-06-13 ENCOUNTER — Other Ambulatory Visit: Payer: Self-pay | Admitting: Urology

## 2018-06-13 DIAGNOSIS — N200 Calculus of kidney: Secondary | ICD-10-CM

## 2018-06-23 ENCOUNTER — Other Ambulatory Visit: Payer: Self-pay

## 2018-06-23 ENCOUNTER — Ambulatory Visit (HOSPITAL_COMMUNITY)
Admission: RE | Admit: 2018-06-23 | Discharge: 2018-06-23 | Disposition: A | Discharge status: Home / Self Care | Payer: BC Managed Care – PPO | Source: Ambulatory Visit | Attending: Urology | Admitting: Urology

## 2018-06-23 DIAGNOSIS — I10 Essential (primary) hypertension: Secondary | ICD-10-CM | POA: Diagnosis not present

## 2018-06-23 DIAGNOSIS — N390 Urinary tract infection, site not specified: Secondary | ICD-10-CM | POA: Diagnosis not present

## 2018-06-23 DIAGNOSIS — N200 Calculus of kidney: Secondary | ICD-10-CM

## 2018-07-24 DIAGNOSIS — Z008 Encounter for other general examination: Secondary | ICD-10-CM | POA: Diagnosis not present

## 2018-07-24 DIAGNOSIS — I10 Essential (primary) hypertension: Secondary | ICD-10-CM | POA: Diagnosis not present

## 2018-07-24 DIAGNOSIS — E119 Type 2 diabetes mellitus without complications: Secondary | ICD-10-CM | POA: Diagnosis not present

## 2018-07-24 DIAGNOSIS — E785 Hyperlipidemia, unspecified: Secondary | ICD-10-CM | POA: Diagnosis not present

## 2018-07-28 ENCOUNTER — Other Ambulatory Visit: Payer: Self-pay

## 2018-07-28 ENCOUNTER — Ambulatory Visit (INDEPENDENT_AMBULATORY_CARE_PROVIDER_SITE_OTHER): Payer: BC Managed Care – PPO | Admitting: Urology

## 2018-07-28 ENCOUNTER — Ambulatory Visit: Payer: BC Managed Care – PPO | Admitting: Urology

## 2018-07-28 ENCOUNTER — Other Ambulatory Visit (HOSPITAL_COMMUNITY)
Admission: AD | Admit: 2018-07-28 | Discharge: 2018-07-28 | Disposition: A | Discharge status: Home / Self Care | Payer: BC Managed Care – PPO | Source: Other Acute Inpatient Hospital | Attending: Urology | Admitting: Urology

## 2018-07-28 DIAGNOSIS — Z87442 Personal history of urinary calculi: Secondary | ICD-10-CM

## 2018-07-28 DIAGNOSIS — N3020 Other chronic cystitis without hematuria: Secondary | ICD-10-CM | POA: Insufficient documentation

## 2018-07-28 LAB — URINALYSIS, COMPLETE (UACMP) WITH MICROSCOPIC
Bilirubin Urine: NEGATIVE
Glucose, UA: 150 mg/dL — AB
Hgb urine dipstick: NEGATIVE
Ketones, ur: NEGATIVE mg/dL
Leukocytes,Ua: NEGATIVE
Nitrite: NEGATIVE
Protein, ur: NEGATIVE mg/dL
Specific Gravity, Urine: 1.017 (ref 1.005–1.030)
pH: 7 (ref 5.0–8.0)

## 2018-07-30 LAB — URINE CULTURE: Culture: >=100000 — AB

## 2018-08-10 ENCOUNTER — Other Ambulatory Visit: Payer: Self-pay

## 2018-08-10 ENCOUNTER — Other Ambulatory Visit (HOSPITAL_COMMUNITY): Payer: Self-pay | Admitting: Urology

## 2018-08-10 ENCOUNTER — Other Ambulatory Visit: Payer: Self-pay | Admitting: Urology

## 2018-08-10 DIAGNOSIS — N3020 Other chronic cystitis without hematuria: Secondary | ICD-10-CM

## 2018-08-10 DIAGNOSIS — Z87442 Personal history of urinary calculi: Secondary | ICD-10-CM

## 2018-08-11 ENCOUNTER — Ambulatory Visit: Payer: BC Managed Care – PPO | Admitting: Nurse Practitioner

## 2018-08-11 ENCOUNTER — Encounter: Payer: Self-pay | Admitting: Nurse Practitioner

## 2018-08-11 VITALS — BP 128/81 | HR 68 | Temp 98.7°F | Ht 67.0 in | Wt 232.0 lb

## 2018-08-11 DIAGNOSIS — E782 Mixed hyperlipidemia: Secondary | ICD-10-CM | POA: Diagnosis not present

## 2018-08-11 DIAGNOSIS — E876 Hypokalemia: Secondary | ICD-10-CM

## 2018-08-11 DIAGNOSIS — E1140 Type 2 diabetes mellitus with diabetic neuropathy, unspecified: Secondary | ICD-10-CM | POA: Diagnosis not present

## 2018-08-11 DIAGNOSIS — Z6829 Body mass index (BMI) 29.0-29.9, adult: Secondary | ICD-10-CM

## 2018-08-11 DIAGNOSIS — I10 Essential (primary) hypertension: Secondary | ICD-10-CM

## 2018-08-11 LAB — BAYER DCA HB A1C WAIVED: HB A1C (BAYER DCA - WAIVED): 7.3 % — ABNORMAL HIGH (ref <7.0)

## 2018-08-11 MED ORDER — GABAPENTIN 100 MG PO CAPS: ORAL_CAPSULE | ORAL | 1 refills | Status: DC

## 2018-08-11 MED ORDER — ATORVASTATIN CALCIUM 10 MG PO TABS: 10.0000 mg | ORAL_TABLET | Freq: Every day | ORAL | 1 refills | Status: DC

## 2018-08-11 MED ORDER — LISINOPRIL 10 MG PO TABS: 10.0000 mg | ORAL_TABLET | Freq: Every day | ORAL | 1 refills | Status: DC

## 2018-08-11 MED ORDER — GLIPIZIDE ER 5 MG PO TB24: 5.0000 mg | ORAL_TABLET | Freq: Every day | ORAL | 1 refills | Status: DC

## 2018-08-11 NOTE — Patient Instructions (Signed)
Diabetes Mellitus and Foot Care Foot care is an important part of your health, especially when you have diabetes. Diabetes may cause you to have problems because of poor blood flow (circulation) to your feet and legs, which can cause your skin to:  Become thinner and drier.  Break more easily.  Heal more slowly.  Peel and crack. You may also have nerve damage (neuropathy) in your legs and feet, causing decreased feeling in them. This means that you may not notice minor injuries to your feet that could lead to more serious problems. Noticing and addressing any potential problems early is the best way to prevent future foot problems. How to care for your feet Foot hygiene  Wash your feet daily with warm water and mild soap. Do not use hot water. Then, pat your feet and the areas between your toes until they are completely dry. Do not soak your feet as this can dry your skin.  Trim your toenails straight across. Do not dig under them or around the cuticle. File the edges of your nails with an emery board or nail file.  Apply a moisturizing lotion or petroleum jelly to the skin on your feet and to dry, brittle toenails. Use lotion that does not contain alcohol and is unscented. Do not apply lotion between your toes. Shoes and socks  Wear clean socks or stockings every day. Make sure they are not too tight. Do not wear knee-high stockings since they may decrease blood flow to your legs.  Wear shoes that fit properly and have enough cushioning. Always look in your shoes before you put them on to be sure there are no objects inside.  To break in new shoes, wear them for just a few hours a day. This prevents injuries on your feet. Wounds, scrapes, corns, and calluses  Check your feet daily for blisters, cuts, bruises, sores, and redness. If you cannot see the bottom of your feet, use a mirror or ask someone for help.  Do not cut corns or calluses or try to remove them with medicine.  If you  find a minor scrape, cut, or break in the skin on your feet, keep it and the skin around it clean and dry. You may clean these areas with mild soap and water. Do not clean the area with peroxide, alcohol, or iodine.  If you have a wound, scrape, corn, or callus on your foot, look at it several times a day to make sure it is healing and not infected. Check for: ? Redness, swelling, or pain. ? Fluid or blood. ? Warmth. ? Pus or a bad smell. General instructions  Do not cross your legs. This may decrease blood flow to your feet.  Do not use heating pads or hot water bottles on your feet. They may burn your skin. If you have lost feeling in your feet or legs, you may not know this is happening until it is too late.  Protect your feet from hot and cold by wearing shoes, such as at the beach or on hot pavement.  Schedule a complete foot exam at least once a year (annually) or more often if you have foot problems. If you have foot problems, report any cuts, sores, or bruises to your health care provider immediately. Contact a health care provider if:  You have a medical condition that increases your risk of infection and you have any cuts, sores, or bruises on your feet.  You have an injury that is not   healing.  You have redness on your legs or feet.  You feel burning or tingling in your legs or feet.  You have pain or cramps in your legs and feet.  Your legs or feet are numb.  Your feet always feel cold.  You have pain around a toenail. Get help right away if:  You have a wound, scrape, corn, or callus on your foot and: ? You have pain, swelling, or redness that gets worse. ? You have fluid or blood coming from the wound, scrape, corn, or callus. ? Your wound, scrape, corn, or callus feels warm to the touch. ? You have pus or a bad smell coming from the wound, scrape, corn, or callus. ? You have a fever. ? You have a red line going up your leg. Summary  Check your feet every day  for cuts, sores, red spots, swelling, and blisters.  Moisturize feet and legs daily.  Wear shoes that fit properly and have enough cushioning.  If you have foot problems, report any cuts, sores, or bruises to your health care provider immediately.  Schedule a complete foot exam at least once a year (annually) or more often if you have foot problems. This information is not intended to replace advice given to you by your health care provider. Make sure you discuss any questions you have with your health care provider. Document Released: 12/26/1999 Document Revised: 02/09/2017 Document Reviewed: 01/30/2016 Elsevier Patient Education  2020 [employer]  

## 2018-08-11 NOTE — Progress Notes (Signed)
Subjective:    Patient ID: Cameron Noble, male    DOB: 1960-07-25, 58 y.o.   MRN: [id number]   Chief Complaint: Medical Management of Chronic Issues    HPI:  1. Essential hypertension, benign No c/o chest pain, sob or headache. Does not check bloodpressure at home. BP Readings from Last 3 Encounters:  08/11/18 128/81  12/20/17 124/78  10/14/17 116/76     2. Mixed hyperlipidemia Trying to watch diet. No dedicated exercise. E does stay very busy  3. Type 2 diabetes mellitus with diabetic neuropathy, without long-term current use of insulin (HCC) Last HGBA1c 6.1. his fasting blood sugars are running below 120, but he does not check it very often. Denies any symptoms of low blood sugar. Is on gabapentin nightly for his feet.  4. Hypokalemia denies any lower ext cramping. Takes a daily potassium suplement  5. BMI 29.0-29.9,adult No recent weight changes    Outpatient Encounter Medications as of 08/11/2018  Medication Sig  . ammonium lactate (AMLACTIN) 12 % cream APPLY TO DRY SKIN ON BOTTOM OF FEET 2 TIMES A DAY  . aspirin 81 MG tablet Take 81 mg by mouth daily.  Marland Kitchen atorvastatin (LIPITOR) 10 MG tablet Take 1 tablet (10 mg total) by mouth daily.  . Cholecalciferol (VITAMIN D-3 PO) Take 1 tablet by mouth daily.  . ciclopirox (PENLAC) 8 % solution APPLY TO ALL TOENAILS DAILY ONCE WEEKLY CLEANSE NAILS THOROUGHLY WITH NAIL POLISH REMOVER  . fish oil-omega-3 fatty acids 1000 MG capsule Take 2 g by mouth daily.  Marland Kitchen gabapentin (NEURONTIN) 100 MG capsule TAKE 1 TO 2 CAPSULES BY MOUTH AT BEDTIME  . glipiZIDE (GLUCOTROL XL) 5 MG 24 hr tablet Take 1 tablet (5 mg total) by mouth daily with breakfast.  . lisinopril (ZESTRIL) 10 MG tablet Take 1 tablet (10 mg total) by mouth daily.  . Potassium Citrate 15 MEQ (1620 MG) TBCR Take 2 tablets by mouth 2 (two) times daily.     Past Surgical History:  Procedure Laterality Date  . BACK SURGERY  03/2002   Dr. Trenton Gammon   . CYSTOSCOPY WITH STENT  PLACEMENT Left 01/27/2015   Procedure: CYSTOSCOPY, RETROGRADE, WITH LEFT STENT PLACEMENT;  Surgeon: Irine Seal, MD;  Location: [facility];  Service: Urology;  Laterality: Left;  I have 2 cases at [facility] and a foley to place at [facility].  I would guess it will be 830-9 before I get up to [facility].   Marland Kitchen DDD C-Spine Repair    . KNEE ARTHROSCOPY Right     History reviewed. No pertinent family history.  New complaints: None today  Social history: Works 2 jobs which equals over 16 hours a day.  Controlled substance contract: N/A    Review of Systems  Constitutional: Negative for activity change and appetite change.  HENT: Negative.   Eyes: Negative for pain.  Respiratory: Negative for shortness of breath.   Cardiovascular: Negative for chest pain, palpitations and leg swelling.  Gastrointestinal: Negative for abdominal pain.  Endocrine: Negative for polydipsia.  Genitourinary: Negative.   Skin: Negative for rash.  Neurological: Positive for numbness (bil feet at night). Negative for dizziness, weakness and headaches.  Hematological: Does not bruise/bleed easily.  Psychiatric/Behavioral: Negative.   All other systems reviewed and are negative.      Objective:   Physical Exam Vitals signs and nursing note reviewed.  Constitutional:      Appearance: Normal appearance. He is well-developed.  HENT:     Head: Normocephalic.  Nose: Nose normal.  Eyes:     Pupils: Pupils are equal, round, and reactive to light.  Neck:     Musculoskeletal: Normal range of motion and neck supple.     Thyroid: No thyroid mass or thyromegaly.     Vascular: No carotid bruit or JVD.     Trachea: Phonation normal.  Cardiovascular:     Rate and Rhythm: Normal rate and regular rhythm.  Pulmonary:     Effort: Pulmonary effort is normal. No respiratory distress.     Breath sounds: Normal breath sounds.  Abdominal:     General: Bowel sounds are normal.     Palpations: Abdomen is soft.     Tenderness: There is no  abdominal tenderness.  Musculoskeletal: Normal range of motion.  Lymphadenopathy:     Cervical: No cervical adenopathy.  Skin:    General: Skin is warm and dry.  Neurological:     Mental Status: He is alert and oriented to person, place, and time.  Psychiatric:        Behavior: Behavior normal.        Thought Content: Thought content normal.        Judgment: Judgment normal.    BP 128/81   Pulse 68   Temp 98.7 F (37.1 C) (Oral)   Ht 5' 7"  (1.702 m)   Wt 232 lb (105.2 kg)   BMI 36.34 kg/m   HGBA1C- 7.3       Assessment & Plan:  Cameron Noble comes in today with chief complaint of Medical Management of Chronic Issues   Diagnosis and orders addressed:  1. Essential hypertension, benign Low sodium diet - CMP14+EGFR - lisinopril (ZESTRIL) 10 MG tablet; Take 1 tablet (10 mg total) by mouth daily.  Dispense: 90 tablet; Refill: 1  2. Mixed hyperlipidemia Low fat diet - Lipid panel - atorvastatin (LIPITOR) 10 MG tablet; Take 1 tablet (10 mg total) by mouth daily.  Dispense: 90 tablet; Refill: 1  3. Type 2 diabetes mellitus with diabetic neuropathy, without long-term current use of insulin (HCC) Continue watching carbs in diet - Bayer DCA Hb A1c Waived - Microalbumin / creatinine urine ratio - glipiZIDE (GLUCOTROL XL) 5 MG 24 hr tablet; Take 1 tablet (5 mg total) by mouth daily with breakfast.  Dispense: 90 tablet; Refill: 1  4. Hypokalemia Continue potassium supplements daily  5. BMI 29.0-29.9,adult Discussed diet and exercise for person with BMI >25 Will recheck weight in 3-6 months    Labs pending Health Maintenance reviewed Diet and exercise encouraged  Follow up plan: 3 months   [name], FNP

## 2018-08-12 LAB — CMP14+EGFR
ALT: 30 IU/L (ref 0–44)
AST: 20 IU/L (ref 0–40)
Albumin/Globulin Ratio: 1.5 (ref 1.2–2.2)
Albumin: 4.1 g/dL (ref 3.8–4.9)
Alkaline Phosphatase: 110 IU/L (ref 39–117)
BUN/Creatinine Ratio: 12 (ref 10–24)
BUN: 17 mg/dL (ref 8–27)
Bilirubin Total: 0.5 mg/dL (ref 0.0–1.2)
CO2: 24 mmol/L (ref 20–29)
Calcium: 9.3 mg/dL (ref 8.6–10.2)
Chloride: 103 mmol/L (ref 96–106)
Creatinine, Ser: 1.4 mg/dL — ABNORMAL HIGH (ref 0.76–1.27)
GFR calc Af Amer: 63 mL/min/{1.73_m2} (ref >59)
GFR calc non Af Amer: 54 mL/min/{1.73_m2} — ABNORMAL LOW (ref >59)
Globulin, Total: 2.8 g/dL (ref 1.5–4.5)
Glucose: 109 mg/dL — ABNORMAL HIGH (ref 65–99)
Potassium: 4.6 mmol/L (ref 3.5–5.2)
Sodium: 142 mmol/L (ref 134–144)
Total Protein: 6.9 g/dL (ref 6.0–8.5)

## 2018-08-12 LAB — LIPID PANEL
Chol/HDL Ratio: 3.2 ratio (ref 0.0–5.0)
Cholesterol, Total: 109 mg/dL (ref 100–199)
HDL: 34 mg/dL — ABNORMAL LOW (ref >39)
LDL Calculated: 61 mg/dL (ref 0–99)
Triglycerides: 70 mg/dL (ref 0–149)
VLDL Cholesterol Cal: 14 mg/dL (ref 5–40)

## 2018-08-21 DIAGNOSIS — E669 Obesity, unspecified: Secondary | ICD-10-CM | POA: Diagnosis not present

## 2018-08-21 DIAGNOSIS — Z7689 Persons encountering health services in other specified circumstances: Secondary | ICD-10-CM | POA: Diagnosis not present

## 2018-08-21 DIAGNOSIS — Z6834 Body mass index (BMI) 34.0-34.9, adult: Secondary | ICD-10-CM | POA: Diagnosis not present

## 2018-09-25 DIAGNOSIS — Z7689 Persons encountering health services in other specified circumstances: Secondary | ICD-10-CM | POA: Diagnosis not present

## 2018-09-25 DIAGNOSIS — E669 Obesity, unspecified: Secondary | ICD-10-CM | POA: Diagnosis not present

## 2018-09-25 DIAGNOSIS — Z6833 Body mass index (BMI) 33.0-33.9, adult: Secondary | ICD-10-CM | POA: Diagnosis not present

## 2018-10-26 DIAGNOSIS — Z23 Encounter for immunization: Secondary | ICD-10-CM | POA: Diagnosis not present

## 2018-11-15 ENCOUNTER — Ambulatory Visit: Payer: BC Managed Care – PPO | Admitting: Nurse Practitioner

## 2018-11-20 DIAGNOSIS — Z6833 Body mass index (BMI) 33.0-33.9, adult: Secondary | ICD-10-CM | POA: Diagnosis not present

## 2018-11-20 DIAGNOSIS — E669 Obesity, unspecified: Secondary | ICD-10-CM | POA: Diagnosis not present

## 2018-11-20 DIAGNOSIS — Z7689 Persons encountering health services in other specified circumstances: Secondary | ICD-10-CM | POA: Diagnosis not present

## 2018-11-30 ENCOUNTER — Other Ambulatory Visit: Payer: Self-pay

## 2018-12-01 ENCOUNTER — Ambulatory Visit: Payer: BC Managed Care – PPO | Admitting: Nurse Practitioner

## 2018-12-01 ENCOUNTER — Encounter: Payer: Self-pay | Admitting: Nurse Practitioner

## 2018-12-01 VITALS — BP 132/84 | HR 80 | Temp 97.3°F | Resp 20 | Ht 67.0 in | Wt 228.0 lb

## 2018-12-01 DIAGNOSIS — I10 Essential (primary) hypertension: Secondary | ICD-10-CM | POA: Diagnosis not present

## 2018-12-01 DIAGNOSIS — Z6829 Body mass index (BMI) 29.0-29.9, adult: Secondary | ICD-10-CM

## 2018-12-01 DIAGNOSIS — E876 Hypokalemia: Secondary | ICD-10-CM | POA: Diagnosis not present

## 2018-12-01 DIAGNOSIS — E782 Mixed hyperlipidemia: Secondary | ICD-10-CM | POA: Diagnosis not present

## 2018-12-01 DIAGNOSIS — R197 Diarrhea, unspecified: Secondary | ICD-10-CM

## 2018-12-01 DIAGNOSIS — E1140 Type 2 diabetes mellitus with diabetic neuropathy, unspecified: Secondary | ICD-10-CM

## 2018-12-01 DIAGNOSIS — Z125 Encounter for screening for malignant neoplasm of prostate: Secondary | ICD-10-CM | POA: Diagnosis not present

## 2018-12-01 LAB — LIPID PANEL
Chol/HDL Ratio: 3 ratio (ref 0.0–5.0)
Cholesterol, Total: 100 mg/dL (ref 100–199)
HDL: 33 mg/dL — ABNORMAL LOW (ref >39)
LDL Chol Calc (NIH): 49 mg/dL (ref 0–99)
Triglycerides: 94 mg/dL (ref 0–149)
VLDL Cholesterol Cal: 18 mg/dL (ref 5–40)

## 2018-12-01 LAB — CMP14+EGFR
ALT: 24 IU/L (ref 0–44)
AST: 19 IU/L (ref 0–40)
Albumin/Globulin Ratio: 1.5 (ref 1.2–2.2)
Albumin: 3.9 g/dL (ref 3.8–4.9)
Alkaline Phosphatase: 98 IU/L (ref 39–117)
BUN/Creatinine Ratio: 12 (ref 10–24)
BUN: 16 mg/dL (ref 8–27)
Bilirubin Total: 0.4 mg/dL (ref 0.0–1.2)
CO2: 22 mmol/L (ref 20–29)
Calcium: 9.6 mg/dL (ref 8.6–10.2)
Chloride: 106 mmol/L (ref 96–106)
Creatinine, Ser: 1.29 mg/dL — ABNORMAL HIGH (ref 0.76–1.27)
GFR calc Af Amer: 69 mL/min/{1.73_m2} (ref >59)
GFR calc non Af Amer: 60 mL/min/{1.73_m2} (ref >59)
Globulin, Total: 2.6 g/dL (ref 1.5–4.5)
Glucose: 209 mg/dL — ABNORMAL HIGH (ref 65–99)
Potassium: 4.6 mmol/L (ref 3.5–5.2)
Sodium: 141 mmol/L (ref 134–144)
Total Protein: 6.5 g/dL (ref 6.0–8.5)

## 2018-12-01 LAB — BAYER DCA HB A1C WAIVED: HB A1C (BAYER DCA - WAIVED): 7.3 % — ABNORMAL HIGH (ref <7.0)

## 2018-12-01 MED ORDER — GABAPENTIN 100 MG PO CAPS: ORAL_CAPSULE | ORAL | 1 refills | Status: DC

## 2018-12-01 MED ORDER — GLIPIZIDE ER 5 MG PO TB24: 5.0000 mg | ORAL_TABLET | Freq: Every day | ORAL | 1 refills | Status: DC

## 2018-12-01 MED ORDER — ATORVASTATIN CALCIUM 10 MG PO TABS: 10.0000 mg | ORAL_TABLET | Freq: Every day | ORAL | 1 refills | Status: DC

## 2018-12-01 MED ORDER — POTASSIUM CITRATE ER 15 MEQ (1620 MG) PO TBCR: 2.0000 | EXTENDED_RELEASE_TABLET | Freq: Two times a day (BID) | ORAL | Status: DC

## 2018-12-01 NOTE — Patient Instructions (Signed)
Diabetes Mellitus and Foot Care Foot care is an important part of your health, especially when you have diabetes. Diabetes may cause you to have problems because of poor blood flow (circulation) to your feet and legs, which can cause your skin to:  Become thinner and drier.  Break more easily.  Heal more slowly.  Peel and crack. You may also have nerve damage (neuropathy) in your legs and feet, causing decreased feeling in them. This means that you may not notice minor injuries to your feet that could lead to more serious problems. Noticing and addressing any potential problems early is the best way to prevent future foot problems. How to care for your feet Foot hygiene  Wash your feet daily with warm water and mild soap. Do not use hot water. Then, pat your feet and the areas between your toes until they are completely dry. Do not soak your feet as this can dry your skin.  Trim your toenails straight across. Do not dig under them or around the cuticle. File the edges of your nails with an emery board or nail file.  Apply a moisturizing lotion or petroleum jelly to the skin on your feet and to dry, brittle toenails. Use lotion that does not contain alcohol and is unscented. Do not apply lotion between your toes. Shoes and socks  Wear clean socks or stockings every day. Make sure they are not too tight. Do not wear knee-high stockings since they may decrease blood flow to your legs.  Wear shoes that fit properly and have enough cushioning. Always look in your shoes before you put them on to be sure there are no objects inside.  To break in new shoes, wear them for just a few hours a day. This prevents injuries on your feet. Wounds, scrapes, corns, and calluses  Check your feet daily for blisters, cuts, bruises, sores, and redness. If you cannot see the bottom of your feet, use a mirror or ask someone for help.  Do not cut corns or calluses or try to remove them with medicine.  If you  find a minor scrape, cut, or break in the skin on your feet, keep it and the skin around it clean and dry. You may clean these areas with mild soap and water. Do not clean the area with peroxide, alcohol, or iodine.  If you have a wound, scrape, corn, or callus on your foot, look at it several times a day to make sure it is healing and not infected. Check for: ? Redness, swelling, or pain. ? Fluid or blood. ? Warmth. ? Pus or a bad smell. General instructions  Do not cross your legs. This may decrease blood flow to your feet.  Do not use heating pads or hot water bottles on your feet. They may burn your skin. If you have lost feeling in your feet or legs, you may not know this is happening until it is too late.  Protect your feet from hot and cold by wearing shoes, such as at the beach or on hot pavement.  Schedule a complete foot exam at least once a year (annually) or more often if you have foot problems. If you have foot problems, report any cuts, sores, or bruises to your health care provider immediately. Contact a health care provider if:  You have a medical condition that increases your risk of infection and you have any cuts, sores, or bruises on your feet.  You have an injury that is not   healing.  You have redness on your legs or feet.  You feel burning or tingling in your legs or feet.  You have pain or cramps in your legs and feet.  Your legs or feet are numb.  Your feet always feel cold.  You have pain around a toenail. Get help right away if:  You have a wound, scrape, corn, or callus on your foot and: ? You have pain, swelling, or redness that gets worse. ? You have fluid or blood coming from the wound, scrape, corn, or callus. ? Your wound, scrape, corn, or callus feels warm to the touch. ? You have pus or a bad smell coming from the wound, scrape, corn, or callus. ? You have a fever. ? You have a red line going up your leg. Summary  Check your feet every day  for cuts, sores, red spots, swelling, and blisters.  Moisturize feet and legs daily.  Wear shoes that fit properly and have enough cushioning.  If you have foot problems, report any cuts, sores, or bruises to your health care provider immediately.  Schedule a complete foot exam at least once a year (annually) or more often if you have foot problems. This information is not intended to replace advice given to you by your health care provider. Make sure you discuss any questions you have with your health care provider. Document Released: 12/26/1999 Document Revised: 02/09/2017 Document Reviewed: 01/30/2016 Elsevier Patient Education  2020 [employer]  

## 2018-12-01 NOTE — Progress Notes (Signed)
Subjective:    Patient ID: Cameron Noble, male    DOB: [birth year], 58 y.o.   MRN: [id number]   Chief Complaint: Medical Management of Chronic Issues    HPI:  1. Essential hypertension, benign No c/o chest pain, sob or headache. Does not check blood pressure at home. BP Readings from Last 3 Encounters:  08/11/18 128/81  12/20/17 124/78  10/14/17 116/76     2. Mixed hyperlipidemia Watches diet and stays very active. Lab Results  Component Value Date   CHOL 109 08/11/2018   HDL 34 (L) 08/11/2018   LDLCALC 61 08/11/2018   TRIG 70 08/11/2018   CHOLHDL 3.2 08/11/2018     3. Type 2 diabetes mellitus with diabetic neuropathy, without long-term current use of insulin (HCC) Fasting blood sugars are no checked very often. He denies any symptoms of low blood sugar. Lab Results  Component Value Date   HGBA1C 7.3 (H) 08/11/2018     4. Hypokalemia No c/o of lower ext cramping Lab Results  Component Value Date   K 4.6 08/11/2018    5. BMI 29.0-29.9,adult No recent weight changes Wt Readings from Last 3 Encounters:  12/01/18 228 lb (103.4 kg)  08/11/18 232 lb (105.2 kg)  12/20/17 211 lb (95.7 kg)   BMI Readings from Last 3 Encounters:  12/01/18 35.71 kg/m  08/11/18 36.34 kg/m  12/20/17 33.05 kg/m       Outpatient Encounter Medications as of 12/01/2018  Medication Sig  . ammonium lactate (AMLACTIN) 12 % cream APPLY TO DRY SKIN ON BOTTOM OF FEET 2 TIMES A DAY  . aspirin 81 MG tablet Take 81 mg by mouth daily.  Marland Kitchen atorvastatin (LIPITOR) 10 MG tablet Take 1 tablet (10 mg total) by mouth daily.  . Cholecalciferol (VITAMIN D-3 PO) Take 1 tablet by mouth daily.  . ciclopirox (PENLAC) 8 % solution APPLY TO ALL TOENAILS DAILY ONCE WEEKLY CLEANSE NAILS THOROUGHLY WITH NAIL POLISH REMOVER  . fish oil-omega-3 fatty acids 1000 MG capsule Take 2 g by mouth daily.  Marland Kitchen gabapentin (NEURONTIN) 100 MG capsule TAKE 1 TO 2 CAPSULES BY MOUTH AT BEDTIME  . glipiZIDE (GLUCOTROL XL) 5  MG 24 hr tablet Take 1 tablet (5 mg total) by mouth daily with breakfast.  . lisinopril (ZESTRIL) 10 MG tablet Take 1 tablet (10 mg total) by mouth daily.  . Potassium Citrate 15 MEQ (1620 MG) TBCR Take 2 tablets by mouth 2 (two) times daily.     Past Surgical History:  Procedure Laterality Date  . BACK SURGERY  03/2002   Dr. Trenton Gammon   . CYSTOSCOPY WITH STENT PLACEMENT Left 01/27/2015   Procedure: CYSTOSCOPY, RETROGRADE, WITH LEFT STENT PLACEMENT;  Surgeon: Irine Seal, MD;  Location: [facility];  Service: Urology;  Laterality: Left;  I have 2 cases at [facility] and a foley to place at [facility].  I would guess it will be 830-9 before I get up to [facility].   Marland Kitchen DDD C-Spine Repair    . KNEE ARTHROSCOPY Right     History reviewed. No pertinent family history.  New complaints: Had bloody diarrhea last week for 1 day  Social history: Works 2 jobs right now.  Controlled substance contract: n/a    Review of Systems  Constitutional: Negative for activity change and appetite change.  HENT: Negative.   Eyes: Negative for pain.  Respiratory: Negative for shortness of breath.   Cardiovascular: Negative for chest pain, palpitations and leg swelling.  Gastrointestinal: Negative for abdominal pain.  Endocrine: Negative  for polydipsia.  Genitourinary: Negative.   Skin: Negative for rash.  Neurological: Negative for dizziness, weakness and headaches.  Hematological: Does not bruise/bleed easily.  Psychiatric/Behavioral: Negative.   All other systems reviewed and are negative.      Objective:   Physical Exam Vitals signs and nursing note reviewed.  Constitutional:      Appearance: Normal appearance. He is well-developed.  HENT:     Head: Normocephalic.     Nose: Nose normal.  Eyes:     Pupils: Pupils are equal, round, and reactive to light.  Neck:     Musculoskeletal: Normal range of motion and neck supple.     Thyroid: No thyroid mass or thyromegaly.     Vascular: No carotid bruit or JVD.     Trachea:  Phonation normal.  Cardiovascular:     Rate and Rhythm: Normal rate and regular rhythm.  Pulmonary:     Effort: Pulmonary effort is normal. No respiratory distress.     Breath sounds: Normal breath sounds.  Abdominal:     General: Bowel sounds are normal.     Palpations: Abdomen is soft.     Tenderness: There is no abdominal tenderness.  Musculoskeletal: Normal range of motion.  Lymphadenopathy:     Cervical: No cervical adenopathy.  Skin:    General: Skin is warm and dry.  Neurological:     Mental Status: He is alert and oriented to person, place, and time.  Psychiatric:        Behavior: Behavior normal.        Thought Content: Thought content normal.        Judgment: Judgment normal.     BP 132/84   Pulse 80   Temp (!) 97.3 F (36.3 C) (Temporal)   Resp 20   Ht 5' 7"  (1.702 m)   Wt 228 lb (103.4 kg)   SpO2 98%   BMI 35.71 kg/m   hgba1c 7.3%     Assessment & Plan:  Belvin Gauss comes in today with chief complaint of Medical Management of Chronic Issues   Diagnosis and orders addressed:  1. Essential hypertension, benign Low sodium diet - CMP14+EGFR  2. Mixed hyperlipidemia Low fat diet - Lipid panel - atorvastatin (LIPITOR) 10 MG tablet; Take 1 tablet (10 mg total) by mouth daily.  Dispense: 90 tablet; Refill: 1  3. Type 2 diabetes mellitus with diabetic neuropathy, without long-term current use of insulin (HCC) Continue to watch carbs in diet - hgba1c - Microalbumin / creatinine urine ratio - glipiZIDE (GLUCOTROL XL) 5 MG 24 hr tablet; Take 1 tablet (5 mg total) by mouth daily with breakfast.  Dispense: 90 tablet; Refill: 1 - gabapentin (NEURONTIN) 100 MG capsule; TAKE 1 TO 2 CAPSULES BY MOUTH AT BEDTIME  Dispense: 180 capsule; Refill: 1  4. Hypokalemia  - Potassium Citrate 15 MEQ (1620 MG) TBCR; Take 2 tablets by mouth 2 (two) times daily.  Dispense: 360 tablet; Refill: 01  5. BMI 29.0-29.9,adult Discussed diet and exercise for person with BMI >25  Will recheck weight in 3-6 months  6. Bloody diarrhea Referral made to gastro - Ambulatory referral to Gastroenterology  7. Prostate cancer screening - PSA, total and free   Labs pending Health Maintenance reviewed Diet and exercise encouraged  Follow up plan: 3 months   [name], FNP

## 2018-12-02 LAB — MICROALBUMIN / CREATININE URINE RATIO
Creatinine, Urine: 145.9 mg/dL
Microalb/Creat Ratio: 35 mg/g creat — ABNORMAL HIGH (ref 0–29)
Microalbumin, Urine: 50.4 ug/mL

## 2018-12-06 LAB — SPECIMEN STATUS REPORT

## 2018-12-06 LAB — PSA, TOTAL AND FREE
PSA, Free Pct: 22.3 %
PSA, Free: 0.29 ng/mL
Prostate Specific Ag, Serum: 1.3 ng/mL (ref 0.0–4.0)

## 2018-12-11 ENCOUNTER — Encounter: Payer: Self-pay | Admitting: Gastroenterology

## 2018-12-11 DIAGNOSIS — Z719 Counseling, unspecified: Secondary | ICD-10-CM | POA: Diagnosis not present

## 2018-12-11 DIAGNOSIS — Z6834 Body mass index (BMI) 34.0-34.9, adult: Secondary | ICD-10-CM | POA: Diagnosis not present

## 2018-12-11 DIAGNOSIS — Z7689 Persons encountering health services in other specified circumstances: Secondary | ICD-10-CM | POA: Diagnosis not present

## 2018-12-11 DIAGNOSIS — E669 Obesity, unspecified: Secondary | ICD-10-CM | POA: Diagnosis not present

## 2018-12-12 ENCOUNTER — Telehealth: Payer: Self-pay | Admitting: Nurse Practitioner

## 2018-12-12 NOTE — Telephone Encounter (Signed)
Yes- stop mobic for now

## 2018-12-12 NOTE — Telephone Encounter (Signed)
Patient says he made an appt to see a Doctor on 01/03/19 regarding his issue with bleeding and was told by his PA at work to ask Cameron Noble if pt should stop taking his aspirin and another medication because they can cause bleeding.

## 2018-12-12 NOTE — Telephone Encounter (Signed)
Blood in stool - please address if he should hold any meds on med list.

## 2018-12-13 NOTE — Telephone Encounter (Signed)
lmtcb

## 2018-12-13 NOTE — Telephone Encounter (Signed)
Patient aware and verbalized understanding. °

## 2018-12-27 DIAGNOSIS — E669 Obesity, unspecified: Secondary | ICD-10-CM | POA: Diagnosis not present

## 2018-12-27 DIAGNOSIS — Z6832 Body mass index (BMI) 32.0-32.9, adult: Secondary | ICD-10-CM | POA: Diagnosis not present

## 2018-12-27 DIAGNOSIS — Z7689 Persons encountering health services in other specified circumstances: Secondary | ICD-10-CM | POA: Diagnosis not present

## 2018-12-27 DIAGNOSIS — Z719 Counseling, unspecified: Secondary | ICD-10-CM | POA: Diagnosis not present

## 2019-01-03 ENCOUNTER — Encounter: Payer: Self-pay | Admitting: Gastroenterology

## 2019-01-03 ENCOUNTER — Ambulatory Visit: Payer: BC Managed Care – PPO | Admitting: Gastroenterology

## 2019-01-03 VITALS — BP 122/70 | HR 80 | Temp 97.4°F | Ht 67.0 in | Wt 222.0 lb

## 2019-01-03 DIAGNOSIS — K625 Hemorrhage of anus and rectum: Secondary | ICD-10-CM | POA: Diagnosis not present

## 2019-01-03 DIAGNOSIS — K5790 Diverticulosis of intestine, part unspecified, without perforation or abscess without bleeding: Secondary | ICD-10-CM

## 2019-01-03 DIAGNOSIS — Z1159 Encounter for screening for other viral diseases: Secondary | ICD-10-CM

## 2019-01-03 MED ORDER — NA SULFATE-K SULFATE-MG SULF 17.5-3.13-1.6 GM/177ML PO SOLN: 1.0000 | Freq: Once | ORAL | 0 refills | Status: AC

## 2019-01-03 NOTE — Progress Notes (Signed)
Reviewed and agree with management plan.  Kier Smead T. Andreia Gandolfi, MD FACG [facility]  

## 2019-01-03 NOTE — Patient Instructions (Signed)
If you are age 60 or older, your body mass index should be between 23-30. Your Body mass index is 34.77 kg/m. If this is out of the aforementioned range listed, please consider follow up with your Primary Care Provider.  If you are age 13 or younger, your body mass index should be between 19-25. Your Body mass index is 34.77 kg/m. If this is out of the aformentioned range listed, please consider follow up with your Primary Care Provider.   We have sent the following medications to your pharmacy for you to pick up at your convenience:  [place] have been scheduled for a colonoscopy. Please follow written instructions given to you at your visit today.  Please pick up your prep supplies at the pharmacy within the next 1-3 days. If you use inhalers (even only as needed), please bring them with you on the day of your procedure.  Due to recent changes in healthcare laws, you may see the results of your imaging and laboratory studies on MyChart before your provider has had a chance to review them.  We understand that in some cases there may be results that are confusing or concerning to you. Not all laboratory results come back in the same time frame and the provider may be waiting for multiple results in order to interpret others.  Please give [place] 48 hours in order for your provider to thoroughly review all the results before contacting the office for clarification of your results.   Thank you for choosing me and [facility]

## 2019-01-03 NOTE — Progress Notes (Signed)
01/03/2019 Cameron Noble [id number] [birth year]   HISTORY OF PRESENT ILLNESS: This is a pleasant 58 year old male who is a patient of Dr. Lynne Leader known to him from previous colonoscopy in September 2011.  At that time he was found to have diverticulosis and 1 polyp that was removed, but was hyperplastic on pathology.  He presents here today with complaints of rectal bleeding.  He says that back on November 11 he had sudden onset of rectal bleeding.  He says that he was at work and he felt like he was going to have diarrhea so he went to the bathroom and passed a "right much amount of blood".  This occurred 2 other times in about a 2-hour span.  He says the next morning he had some blood as well, but was smaller in amount.  After that it completely resolved and has not recurred.  He says that he moves his bowels fairly regularly.  Denies any abdominal pain.   Past Medical History:  Diagnosis Date  . Allergic rhinitis   . Chronic back pain   . DDD (degenerative disc disease), cervical   . Diabetes mellitus   . Diverticulosis   . Hyperlipidemia   . Hypertension   . kidney stones   . Kidney stones    multiple stones   . URI (upper respiratory infection)   . UTI (lower urinary tract infection) 2017   Past Surgical History:  Procedure Laterality Date  . BACK SURGERY  03/2002   Dr. Trenton Gammon   . CYSTOSCOPY WITH STENT PLACEMENT Left 01/27/2015   Procedure: CYSTOSCOPY, RETROGRADE, WITH LEFT STENT PLACEMENT;  Surgeon: Irine Seal, MD;  Location: [facility];  Service: Urology;  Laterality: Left;  I have 2 cases at [facility] and a foley to place at [facility].  I would guess it will be 830-9 before I get up to [facility].   Marland Kitchen DDD C-Spine Repair    . KNEE ARTHROSCOPY Right     reports that he has never smoked. He has never used smokeless tobacco. He reports that he does not drink alcohol or use drugs. family history includes Diabetes in his mother; Hypertension in his mother. No Known Allergies    Outpatient Encounter  Medications as of 01/03/2019  Medication Sig  . ammonium lactate (AMLACTIN) 12 % cream APPLY TO DRY SKIN ON BOTTOM OF FEET 2 TIMES A DAY  . aspirin 81 MG tablet Take 81 mg by mouth daily.  Marland Kitchen atorvastatin (LIPITOR) 10 MG tablet Take 1 tablet (10 mg total) by mouth daily.  . Cholecalciferol (VITAMIN D-3 PO) Take 1 tablet by mouth daily.  . ciclopirox (PENLAC) 8 % solution APPLY TO ALL TOENAILS DAILY ONCE WEEKLY CLEANSE NAILS THOROUGHLY WITH NAIL POLISH REMOVER  . fish oil-omega-3 fatty acids 1000 MG capsule Take 2 g by mouth daily.  Marland Kitchen gabapentin (NEURONTIN) 100 MG capsule TAKE 1 TO 2 CAPSULES BY MOUTH AT BEDTIME  . glipiZIDE (GLUCOTROL XL) 5 MG 24 hr tablet Take 1 tablet (5 mg total) by mouth daily with breakfast.  . lisinopril (ZESTRIL) 10 MG tablet Take 1 tablet (10 mg total) by mouth daily.  . meloxicam (MOBIC) 7.5 MG tablet Take 7.5 mg by mouth daily.  . Potassium Citrate 15 MEQ (1620 MG) TBCR Take 2 tablets by mouth 2 (two) times daily.   No facility-administered encounter medications on file as of 01/03/2019.     REVIEW OF SYSTEMS  : All other systems reviewed and negative except where noted in the History  of Present Illness.   PHYSICAL EXAM: BP 122/70   Pulse 80   Temp (!) 97.4 F (36.3 C)   Ht 5\' 7"  (1.702 m)   Wt 222 lb (100.7 kg)   BMI 34.77 kg/m  General: Well developed AA male in no acute distress Head: Normocephalic and atraumatic Eyes:  Sclerae anicteric, conjunctiva pink. Ears: Normal auditory acuity Lungs: Clear throughout to auscultation; no increased WOB. Heart: Regular rate and rhythm; no M/R/G. Abdomen: Soft, non-distended.  BS present.  Non-tender. Rectal:  Will be done at the time of colonoscopy. Musculoskeletal: Symmetrical with no gross deformities  Skin: No lesions on visible extremities Extremities: No edema  Neurological: Alert oriented x 4, grossly non-focal Psychological:  Alert and cooperative. Normal mood and affect  ASSESSMENT AND  PLAN: *Rectal bleeding: Has diverticulosis on previous colonoscopy and certainly sounds diverticular in origin as this occurred in rather large volume for a few episodes before resolving.  Has not recurred.  That being said, his last colonoscopy was over 9 years ago.  I think that we should repeat colonoscopy.  We will schedule Dr. Fuller Plan.  **The risks, benefits, and alternatives to colonoscopy were discussed with the patient and he consents to proceed.   CC:  Hassell Done, Mary-Margaret, *

## 2019-01-08 ENCOUNTER — Telehealth: Payer: Self-pay | Admitting: Gastroenterology

## 2019-01-08 NOTE — Telephone Encounter (Signed)
Called patient regarding coupon. Pt advised to pick up coupon before Thursday.  Coupon expires 01/11/19.  Pt aware.

## 2019-01-15 ENCOUNTER — Other Ambulatory Visit: Payer: Self-pay | Admitting: Gastroenterology

## 2019-01-15 ENCOUNTER — Ambulatory Visit (INDEPENDENT_AMBULATORY_CARE_PROVIDER_SITE_OTHER): Payer: BC Managed Care – PPO

## 2019-01-15 DIAGNOSIS — Z1159 Encounter for screening for other viral diseases: Secondary | ICD-10-CM

## 2019-01-16 LAB — SARS CORONAVIRUS 2 (TAT 6-24 HRS): SARS Coronavirus 2: NEGATIVE

## 2019-01-17 ENCOUNTER — Ambulatory Visit (AMBULATORY_SURGERY_CENTER): Payer: BC Managed Care – PPO | Admitting: Gastroenterology

## 2019-01-17 ENCOUNTER — Other Ambulatory Visit: Payer: Self-pay

## 2019-01-17 ENCOUNTER — Encounter: Payer: Self-pay | Admitting: Gastroenterology

## 2019-01-17 VITALS — BP 117/70 | HR 76 | Temp 99.0°F | Resp 16 | Ht 67.0 in | Wt 222.0 lb

## 2019-01-17 DIAGNOSIS — K625 Hemorrhage of anus and rectum: Secondary | ICD-10-CM | POA: Diagnosis not present

## 2019-01-17 DIAGNOSIS — D125 Benign neoplasm of sigmoid colon: Secondary | ICD-10-CM

## 2019-01-17 DIAGNOSIS — K635 Polyp of colon: Secondary | ICD-10-CM

## 2019-01-17 DIAGNOSIS — K5730 Diverticulosis of large intestine without perforation or abscess without bleeding: Secondary | ICD-10-CM | POA: Diagnosis not present

## 2019-01-17 DIAGNOSIS — K921 Melena: Secondary | ICD-10-CM | POA: Diagnosis not present

## 2019-01-17 DIAGNOSIS — K640 First degree hemorrhoids: Secondary | ICD-10-CM

## 2019-01-17 DIAGNOSIS — D124 Benign neoplasm of descending colon: Secondary | ICD-10-CM

## 2019-01-17 MED ORDER — SODIUM CHLORIDE 0.9 % IV SOLN: 500.0000 mL | Freq: Once | INTRAVENOUS | Status: DC

## 2019-01-17 NOTE — Progress Notes (Signed)
To PACU, VSS. Report to [email or web address] 

## 2019-01-17 NOTE — Patient Instructions (Signed)
Impression/Recommendations:  Polyp handout given to patient. Diverticulosis handout given to patient. Hemorrhoid handout given to patient. High fiber diet handout given to patient.  Repeat colonoscopy for surveillance.  Date to be determined after pathology results reviewed.  Consider Hemorrhoid banding if hemorrhoids are frequently symptomatic.  Continue present medications. Await pathology results.  YOU HAD AN ENDOSCOPIC PROCEDURE TODAY AT [facility]:   Refer to the procedure report that was given to you for any specific questions about what was found during the examination.  If the procedure report does not answer your questions, please call your gastroenterologist to clarify.  If you requested that your care partner not be given the details of your procedure findings, then the procedure report has been included in a sealed envelope for you to review at your convenience later.  YOU SHOULD EXPECT: Some feelings of bloating in the abdomen. Passage of more gas than usual.  Walking can help get rid of the air that was put into your GI tract during the procedure and reduce the bloating. If you had a lower endoscopy (such as a colonoscopy or flexible sigmoidoscopy) you may notice spotting of blood in your stool or on the toilet paper. If you underwent a bowel prep for your procedure, you may not have a normal bowel movement for a few days.  Please Note:  You might notice some irritation and congestion in your nose or some drainage.  This is from the oxygen used during your procedure.  There is no need for concern and it should clear up in a day or so.  SYMPTOMS TO REPORT IMMEDIATELY:   Following lower endoscopy (colonoscopy or flexible sigmoidoscopy):  Excessive amounts of blood in the stool  Significant tenderness or worsening of abdominal pains  Swelling of the abdomen that is new, acute  Fever of 100F or higher For urgent or emergent issues, a gastroenterologist can be  reached at any hour by calling [phone].   DIET:  We do recommend a small meal at first, but then you may proceed to your regular diet.  Drink plenty of fluids but you should avoid alcoholic beverages for 24 hours.  ACTIVITY:  You should plan to take it easy for the rest of today and you should NOT DRIVE or use heavy machinery until tomorrow (because of the sedation medicines used during the test).    FOLLOW UP: Our staff will call the number listed on your records 48-72 hours following your procedure to check on you and address any questions or concerns that you may have regarding the information given to you following your procedure. If we do not reach you, we will leave a message.  We will attempt to reach you two times.  During this call, we will ask if you have developed any symptoms of COVID 19. If you develop any symptoms (ie: fever, flu-like symptoms, shortness of breath, cough etc.) before then, please call [phone].  If you test positive for Covid 19 in the 2 weeks post procedure, please call and report this information to [place].    If any biopsies were taken you will be contacted by phone or by letter within the next 1-3 weeks.  Please call us at [phone] if you have not heard about the biopsies in 3 weeks.    SIGNATURES/CONFIDENTIALITY: You and/or your care partner have signed paperwork which will be entered into your electronic medical record.  These signatures attest to the fact that that the information above on  your After Visit Summary has been reviewed and is understood.  Full responsibility of the confidentiality of this discharge information lies with you and/or your care-partner. 

## 2019-01-17 NOTE — Progress Notes (Signed)
Called to room to assist during endoscopic procedure.  Patient ID and intended procedure confirmed with present staff. Received instructions for my participation in the procedure from the performing physician.  

## 2019-01-17 NOTE — Op Note (Signed)
[place] Patient Name: Cameron Noble Procedure Date: 01/17/2019 11:12 AM MRN: [id number] Endoscopist: Ladene Artist , MD Age: 59 Referring MD:  Date of Birth: May 21, 1960 Gender: Male Account #: [id number] Procedure:                Colonoscopy Indications:              Hematochezia Medicines:                Monitored Anesthesia Care Procedure:                Pre-Anesthesia Assessment:                           - Prior to the procedure, a History and Physical                            was performed, and patient medications and                            allergies were reviewed. The patient's tolerance of                            previous anesthesia was also reviewed. The risks                            and benefits of the procedure and the sedation                            options and risks were discussed with the patient.                            All questions were answered, and informed consent                            was obtained. Prior Anticoagulants: The patient has                            taken no previous anticoagulant or antiplatelet                            agents. ASA Grade Assessment: II - A patient with                            mild systemic disease. After reviewing the risks                            and benefits, the patient was deemed in                            satisfactory condition to undergo the procedure.                           After obtaining informed consent, the colonoscope  was passed under direct vision. Throughout the                            procedure, the patient's blood pressure, pulse, and                            oxygen saturations were monitored continuously. The                            Colonoscope was introduced through the anus and                            advanced to the the cecum, identified by                            appendiceal orifice and ileocecal valve. The               ileocecal valve, appendiceal orifice, and rectum                            were photographed. The quality of the bowel                            preparation was good. The colonoscopy was performed                            without difficulty. The patient tolerated the                            procedure well. Scope In: 11:18:17 AM Scope Out: 11:39:39 AM Scope Withdrawal Time: 0 hours 18 minutes 42 seconds  Total Procedure Duration: 0 hours 21 minutes 22 seconds  Findings:                 The perianal and digital rectal examinations were                            normal.                           Eight sessile polyps were found in the sigmoid                            colon (6) and descending colon (2). The polyps were                            5 to 8 mm in size. These polyps were removed with a                            cold snare. Resection and retrieval were complete                            for 7 of the 8 polyps. A 5 mm sigmoid colon polyp  was not retrieved.                           Scattered medium-mouthed diverticula were found in                            the right colon. There was no evidence of                            diverticular bleeding.                           Multiple medium-mouthed diverticula were found in                            the left colon. There was evidence of diverticular                            spasm. There was no evidence of diverticular                            bleeding.                           Internal hemorrhoids were found during                            retroflexion. The hemorrhoids were medium-sized and                            Grade I (internal hemorrhoids that do not prolapse).                           The exam was otherwise without abnormality on                            direct and retroflexion views. Complications:            No immediate complications. Estimated blood loss:                             None. Estimated Blood Loss:     Estimated blood loss: none. Impression:               - Eight 5 to 8 mm polyps in the sigmoid colon and                            in the descending colon, removed with a cold snare.                            Resected and retrieved.                           - Mild diverticulosis in the right colon.                           -  Moderate diverticulosis in the left colon.                           - Internal hemorrhoids.                           - The examination was otherwise normal on direct                            and retroflexion views. Recommendation:           - Repeat colonoscopy after studies are complete for                            surveillance based on pathology results.                           - Hematochezia due to diverticular bleed or                            hemorrhoidal bleed. If hemorrhoids are frequently                            symptomatic consider hemorrhoidal banding.                           - Patient has a contact number available for                            emergencies. The signs and symptoms of potential                            delayed complications were discussed with the                            patient. Return to normal activities tomorrow.                            Written discharge instructions were provided to the                            patient.                           - High fiber diet long term.                           - Continue present medications.                           - Await pathology results. Ladene Artist, MD 01/17/2019 11:47:01 AM This report has been signed electronically.

## 2019-01-17 NOTE — Progress Notes (Signed)
Temp  JR  VS  KA  Pt's states no medical or surgical changes since previsit or office visit.

## 2019-01-19 ENCOUNTER — Telehealth: Payer: Self-pay | Admitting: *Deleted

## 2019-01-19 NOTE — Telephone Encounter (Signed)
Attempted f/u phone call. No answer. Left message. °

## 2019-01-19 NOTE — Telephone Encounter (Signed)
  Follow up Call-  Call back number 01/17/2019  Post procedure Call Back phone  # [phone]  Permission to leave phone message Yes  Some recent data might be hidden     Patient questions:  Do you have a fever, pain , or abdominal swelling? No. Pain Score  0 *  Have you tolerated food without any problems? Yes.    Have you been able to return to your normal activities? Yes.    Do you have any questions about your discharge instructions: Diet   No. Medications  No. Follow up visit  No.  Do you have questions or concerns about your Care? No.  Actions: * If pain score is 4 or above: No action needed, pain <4.  1. Have you developed a fever since your procedure? no  2.   Have you had an respiratory symptoms (SOB or cough) since your procedure? no  3.   Have you tested positive for COVID 19 since your procedure no  4.   Have you had any family members/close contacts diagnosed with the COVID 19 since your procedure?  no   If yes to any of these questions please route to Joylene John, RN and Alphonsa Gin, Therapist, sports.

## 2019-01-22 ENCOUNTER — Encounter: Payer: Self-pay | Admitting: Gastroenterology

## 2019-03-06 ENCOUNTER — Ambulatory Visit: Payer: BC Managed Care – PPO | Admitting: Nurse Practitioner

## 2019-03-21 ENCOUNTER — Other Ambulatory Visit: Payer: Self-pay

## 2019-03-22 ENCOUNTER — Encounter: Payer: Self-pay | Admitting: Nurse Practitioner

## 2019-03-22 ENCOUNTER — Ambulatory Visit: Payer: BC Managed Care – PPO | Admitting: Nurse Practitioner

## 2019-03-22 VITALS — BP 107/69 | HR 71 | Temp 98.7°F | Resp 20 | Ht 67.0 in | Wt 224.0 lb

## 2019-03-22 DIAGNOSIS — B351 Tinea unguium: Secondary | ICD-10-CM | POA: Insufficient documentation

## 2019-03-22 DIAGNOSIS — E782 Mixed hyperlipidemia: Secondary | ICD-10-CM

## 2019-03-22 DIAGNOSIS — I10 Essential (primary) hypertension: Secondary | ICD-10-CM

## 2019-03-22 DIAGNOSIS — E876 Hypokalemia: Secondary | ICD-10-CM | POA: Diagnosis not present

## 2019-03-22 DIAGNOSIS — Z6829 Body mass index (BMI) 29.0-29.9, adult: Secondary | ICD-10-CM

## 2019-03-22 DIAGNOSIS — E1140 Type 2 diabetes mellitus with diabetic neuropathy, unspecified: Secondary | ICD-10-CM | POA: Diagnosis not present

## 2019-03-22 DIAGNOSIS — I495 Sick sinus syndrome: Secondary | ICD-10-CM | POA: Insufficient documentation

## 2019-03-22 LAB — BAYER DCA HB A1C WAIVED: HB A1C (BAYER DCA - WAIVED): 7.8 % — ABNORMAL HIGH

## 2019-03-22 MED ORDER — POTASSIUM CITRATE ER 15 MEQ (1620 MG) PO TBCR: 2.0000 | EXTENDED_RELEASE_TABLET | Freq: Two times a day (BID) | ORAL | Status: DC

## 2019-03-22 MED ORDER — CICLOPIROX 8 % EX SOLN: Freq: Every day | CUTANEOUS | 3 refills | Status: AC

## 2019-03-22 MED ORDER — LISINOPRIL 10 MG PO TABS: 10.0000 mg | ORAL_TABLET | Freq: Every day | ORAL | 1 refills | Status: DC

## 2019-03-22 MED ORDER — METFORMIN HCL 500 MG PO TABS: 500.0000 mg | ORAL_TABLET | Freq: Two times a day (BID) | ORAL | 3 refills | Status: DC

## 2019-03-22 MED ORDER — ATORVASTATIN CALCIUM 10 MG PO TABS: 10.0000 mg | ORAL_TABLET | Freq: Every day | ORAL | 1 refills | Status: DC

## 2019-03-22 MED ORDER — GLIPIZIDE ER 5 MG PO TB24: 5.0000 mg | ORAL_TABLET | Freq: Every day | ORAL | 1 refills | Status: DC

## 2019-03-22 MED ORDER — GABAPENTIN 100 MG PO CAPS: ORAL_CAPSULE | ORAL | 1 refills | Status: DC

## 2019-03-22 NOTE — Progress Notes (Signed)
Subjective:    Patient ID: Cameron Noble, male    DOB: 1960-11-18, 59 y.o.   MRN: [id number]   Chief Complaint: medical management of chronic issues   HPI:  1. Essential hypertension, benign No c/o chest pain, sob or headache. Does not check blood pressure at home. BP Readings from Last 3 Encounters:  01/17/19 117/70  01/03/19 122/70  12/01/18 132/84     2. Type 2 diabetes mellitus with diabetic neuropathy, without long-term current use of insulin (HCC) Fasting blood sugars are running below 140. He does ot check blood sugars every day. He some what watches diet. deneis any symptoms of low blood sugar. Lab Results  Component Value Date   HGBA1C 7.3 (H) 12/01/2018     3. Mixed hyperlipidemia Does not watch diet and does very little exercise. He does stay very active and busy. Lab Results  Component Value Date   CHOL 100 12/01/2018   HDL 33 (L) 12/01/2018   LDLCALC 49 12/01/2018   TRIG 94 12/01/2018   CHOLHDL 3.0 12/01/2018     4. Hypokalemia No c/o lower ext cramping Lab Results  Component Value Date   K 4.6 12/01/2018    5. BMI 29.0-29.9,adult No recent weight changes Wt Readings from Last 3 Encounters:  01/17/19 222 lb (100.7 kg)  01/03/19 222 lb (100.7 kg)  12/01/18 228 lb (103.4 kg)   BP Readings from Last 3 Encounters:  01/17/19 117/70  01/03/19 122/70  12/01/18 132/84       Outpatient Encounter Medications as of 03/22/2019  Medication Sig  . ammonium lactate (AMLACTIN) 12 % cream APPLY TO DRY SKIN ON BOTTOM OF FEET 2 TIMES A DAY  . aspirin 81 MG tablet Take 81 mg by mouth daily.  Marland Kitchen atorvastatin (LIPITOR) 10 MG tablet Take 1 tablet (10 mg total) by mouth daily.  . Cholecalciferol (VITAMIN D-3 PO) Take 1 tablet by mouth daily.  . ciclopirox (PENLAC) 8 % solution APPLY TO ALL TOENAILS DAILY ONCE WEEKLY CLEANSE NAILS THOROUGHLY WITH NAIL POLISH REMOVER  . fish oil-omega-3 fatty acids 1000 MG capsule Take 2 g by mouth daily.  Marland Kitchen gabapentin  (NEURONTIN) 100 MG capsule TAKE 1 TO 2 CAPSULES BY MOUTH AT BEDTIME  . glipiZIDE (GLUCOTROL XL) 5 MG 24 hr tablet Take 1 tablet (5 mg total) by mouth daily with breakfast.  . lisinopril (ZESTRIL) 10 MG tablet Take 1 tablet (10 mg total) by mouth daily.  . meloxicam (MOBIC) 7.5 MG tablet Take 7.5 mg by mouth daily.  . Potassium Citrate 15 MEQ (1620 MG) TBCR Take 2 tablets by mouth 2 (two) times daily.     Past Surgical History:  Procedure Laterality Date  . BACK SURGERY  03/2002   Dr. Trenton Gammon   . CYSTOSCOPY WITH STENT PLACEMENT Left 01/27/2015   Procedure: CYSTOSCOPY, RETROGRADE, WITH LEFT STENT PLACEMENT;  Surgeon: Irine Seal, MD;  Location: [facility];  Service: Urology;  Laterality: Left;  I have 2 cases at [facility] and a foley to place at [facility].  I would guess it will be 830-9 before I get up to [facility].   Marland Kitchen DDD C-Spine Repair    . KNEE ARTHROSCOPY Right     Family History  Problem Relation Age of Onset  . Diabetes Mother   . Hypertension Mother   . Colon cancer Neg Hx   . Esophageal cancer Neg Hx   . Rectal cancer Neg Hx   . Stomach cancer Neg Hx     New complaints: None  Social history: Wants to retire in a year  Controlled substance contract: n/a     Review of Systems  Constitutional: Negative for diaphoresis.  Eyes: Negative for pain.  Respiratory: Negative for shortness of breath.   Cardiovascular: Negative for chest pain, palpitations and leg swelling.  Gastrointestinal: Negative for abdominal pain.  Endocrine: Negative for polydipsia.  Skin: Negative for rash.  Neurological: Negative for dizziness, weakness and headaches.  Hematological: Does not bruise/bleed easily.  All other systems reviewed and are negative.      Objective:   Physical Exam Vitals and nursing note reviewed.  Constitutional:      Appearance: Normal appearance. He is well-developed.  HENT:     Head: Normocephalic.     Nose: Nose normal.  Eyes:     Pupils: Pupils are equal, round, and reactive to  light.  Neck:     Thyroid: No thyroid mass or thyromegaly.     Vascular: No carotid bruit or JVD.     Trachea: Phonation normal.  Cardiovascular:     Rate and Rhythm: Normal rate and regular rhythm.  Pulmonary:     Effort: Pulmonary effort is normal. No respiratory distress.     Breath sounds: Normal breath sounds.  Abdominal:     General: Bowel sounds are normal.     Palpations: Abdomen is soft.     Tenderness: There is no abdominal tenderness.  Musculoskeletal:        General: Normal range of motion.     Cervical back: Normal range of motion and neck supple.  Lymphadenopathy:     Cervical: No cervical adenopathy.  Skin:    General: Skin is warm and dry.  Neurological:     Mental Status: He is alert and oriented to person, place, and time.  Psychiatric:        Behavior: Behavior normal.        Thought Content: Thought content normal.        Judgment: Judgment normal.    BP 107/69   Pulse 71   Temp 98.7 F (37.1 C) (Temporal)   Resp 20   Ht 5' 7"  (1.702 m)   Wt 224 lb (101.6 kg)   SpO2 99%   BMI 35.08 kg/m         Assessment & Plan:  Cameron Noble comes in today with chief complaint of Medical Management of Chronic Issues   Diagnosis and orders addressed:  1. Essential hypertension, benign Low sodium diet  - CBC with Differential/Platelet - CMP14+EGFR - lisinopril (ZESTRIL) 10 MG tablet; Take 1 tablet (10 mg total) by mouth daily.  Dispense: 90 tablet; Refill: 1  2. Type 2 diabetes mellitus with diabetic neuropathy, without long-term current use of insulin (HCC) Carb counting Add metformin 535m bid back to meds Check fasting b lood sugar evry morning - Bayer DCA Hb A1c Waived - glipiZIDE (GLUCOTROL XL) 5 MG 24 hr tablet; Take 1 tablet (5 mg total) by mouth daily with breakfast.  Dispense: 90 tablet; Refill: 1 - gabapentin (NEURONTIN) 100 MG capsule; TAKE 1 TO 2 CAPSULES BY MOUTH AT BEDTIME  Dispense: 180 capsule; Refill: 1 - metFORMIN (GLUCOPHAGE) 500  MG tablet; Take 1 tablet (500 mg total) by mouth 2 (two) times daily with a meal.  Dispense: 180 tablet; Refill: 3  3. Mixed hyperlipidemia Low fat diet - Lipid panel - atorvastatin (LIPITOR) 10 MG tablet; Take 1 tablet (10 mg total) by mouth daily.  Dispense: 90 tablet; Refill: 1  4. Hypokalemia Labs  pending - Potassium Citrate 15 MEQ (1620 MG) TBCR; Take 2 tablets by mouth 2 (two) times daily.  Dispense: 360 tablet; Refill: 01  5. BMI 29.0-29.9,adult Discussed diet and exercise for person with BMI >25 Will recheck weight in 3-6 months  6. Sinoatrial node dysfunction (HCC)  7. Nail fungus - ciclopirox (PENLAC) 8 % solution; Apply topically at bedtime. Apply over nail and surrounding skin. Apply daily over previous coat. After seven (7) days, may remove with alcohol and continue cycle.  Dispense: 6.6 mL; Refill: 3   Labs pending Health Maintenance reviewed Diet and exercise encouraged  Follow up plan: 3 months   [name], FNP

## 2019-03-22 NOTE — Patient Instructions (Signed)
Diabetes Mellitus and Foot Care Foot care is an important part of your health, especially when you have diabetes. Diabetes may cause you to have problems because of poor blood flow (circulation) to your feet and legs, which can cause your skin to:  Become thinner and drier.  Break more easily.  Heal more slowly.  Peel and crack. You may also have nerve damage (neuropathy) in your legs and feet, causing decreased feeling in them. This means that you may not notice minor injuries to your feet that could lead to more serious problems. Noticing and addressing any potential problems early is the best way to prevent future foot problems. How to care for your feet Foot hygiene  Wash your feet daily with warm water and mild soap. Do not use hot water. Then, pat your feet and the areas between your toes until they are completely dry. Do not soak your feet as this can dry your skin.  Trim your toenails straight across. Do not dig under them or around the cuticle. File the edges of your nails with an emery board or nail file.  Apply a moisturizing lotion or petroleum jelly to the skin on your feet and to dry, brittle toenails. Use lotion that does not contain alcohol and is unscented. Do not apply lotion between your toes. Shoes and socks  Wear clean socks or stockings every day. Make sure they are not too tight. Do not wear knee-high stockings since they may decrease blood flow to your legs.  Wear shoes that fit properly and have enough cushioning. Always look in your shoes before you put them on to be sure there are no objects inside.  To break in new shoes, wear them for just a few hours a day. This prevents injuries on your feet. Wounds, scrapes, corns, and calluses  Check your feet daily for blisters, cuts, bruises, sores, and redness. If you cannot see the bottom of your feet, use a mirror or ask someone for help.  Do not cut corns or calluses or try to remove them with medicine.  If you  find a minor scrape, cut, or break in the skin on your feet, keep it and the skin around it clean and dry. You may clean these areas with mild soap and water. Do not clean the area with peroxide, alcohol, or iodine.  If you have a wound, scrape, corn, or callus on your foot, look at it several times a day to make sure it is healing and not infected. Check for: ? Redness, swelling, or pain. ? Fluid or blood. ? Warmth. ? Pus or a bad smell. General instructions  Do not cross your legs. This may decrease blood flow to your feet.  Do not use heating pads or hot water bottles on your feet. They may burn your skin. If you have lost feeling in your feet or legs, you may not know this is happening until it is too late.  Protect your feet from hot and cold by wearing shoes, such as at the beach or on hot pavement.  Schedule a complete foot exam at least once a year (annually) or more often if you have foot problems. If you have foot problems, report any cuts, sores, or bruises to your health care provider immediately. Contact a health care provider if:  You have a medical condition that increases your risk of infection and you have any cuts, sores, or bruises on your feet.  You have an injury that is not   healing.  You have redness on your legs or feet.  You feel burning or tingling in your legs or feet.  You have pain or cramps in your legs and feet.  Your legs or feet are numb.  Your feet always feel cold.  You have pain around a toenail. Get help right away if:  You have a wound, scrape, corn, or callus on your foot and: ? You have pain, swelling, or redness that gets worse. ? You have fluid or blood coming from the wound, scrape, corn, or callus. ? Your wound, scrape, corn, or callus feels warm to the touch. ? You have pus or a bad smell coming from the wound, scrape, corn, or callus. ? You have a fever. ? You have a red line going up your leg. Summary  Check your feet every day  for cuts, sores, red spots, swelling, and blisters.  Moisturize feet and legs daily.  Wear shoes that fit properly and have enough cushioning.  If you have foot problems, report any cuts, sores, or bruises to your health care provider immediately.  Schedule a complete foot exam at least once a year (annually) or more often if you have foot problems. This information is not intended to replace advice given to you by your health care provider. Make sure you discuss any questions you have with your health care provider. Document Revised: 09/20/2018 Document Reviewed: 01/30/2016 Elsevier Patient Education  2020 [employer]  

## 2019-03-23 LAB — CMP14+EGFR
ALT: 22 IU/L (ref 0–44)
AST: 26 IU/L (ref 0–40)
Albumin/Globulin Ratio: 1.4 (ref 1.2–2.2)
Albumin: 4.1 g/dL (ref 3.8–4.8)
Alkaline Phosphatase: 115 IU/L (ref 39–117)
BUN/Creatinine Ratio: 14 (ref 10–24)
BUN: 17 mg/dL (ref 8–27)
Bilirubin Total: 0.5 mg/dL (ref 0.0–1.2)
CO2: 21 mmol/L (ref 20–29)
Calcium: 9.2 mg/dL (ref 8.6–10.2)
Chloride: 104 mmol/L (ref 96–106)
Creatinine, Ser: 1.25 mg/dL (ref 0.76–1.27)
GFR calc Af Amer: 71 mL/min/{1.73_m2} (ref >59)
GFR calc non Af Amer: 62 mL/min/{1.73_m2} (ref >59)
Globulin, Total: 2.9 g/dL (ref 1.5–4.5)
Glucose: 140 mg/dL — ABNORMAL HIGH (ref 65–99)
Potassium: 4.3 mmol/L (ref 3.5–5.2)
Sodium: 139 mmol/L (ref 134–144)
Total Protein: 7 g/dL (ref 6.0–8.5)

## 2019-03-23 LAB — LIPID PANEL
Chol/HDL Ratio: 2.6 ratio (ref 0.0–5.0)
Cholesterol, Total: 95 mg/dL — ABNORMAL LOW (ref 100–199)
HDL: 37 mg/dL — ABNORMAL LOW (ref >39)
LDL Chol Calc (NIH): 44 mg/dL (ref 0–99)
Triglycerides: 62 mg/dL (ref 0–149)
VLDL Cholesterol Cal: 14 mg/dL (ref 5–40)

## 2019-03-23 LAB — CBC WITH DIFFERENTIAL/PLATELET
Basophils Absolute: 0 10*3/uL (ref 0.0–0.2)
Basos: 0 %
EOS (ABSOLUTE): 0.1 10*3/uL (ref 0.0–0.4)
Eos: 1 %
Hematocrit: 41.4 % (ref 37.5–51.0)
Hemoglobin: 12.8 g/dL — ABNORMAL LOW (ref 13.0–17.7)
Immature Grans (Abs): 0 10*3/uL (ref 0.0–0.1)
Immature Granulocytes: 0 %
Lymphocytes Absolute: 2.3 10*3/uL (ref 0.7–3.1)
Lymphs: 25 %
MCH: 24 pg — ABNORMAL LOW (ref 26.6–33.0)
MCHC: 30.9 g/dL — ABNORMAL LOW (ref 31.5–35.7)
MCV: 78 fL — ABNORMAL LOW (ref 79–97)
Monocytes Absolute: 0.8 10*3/uL (ref 0.1–0.9)
Monocytes: 9 %
Neutrophils Absolute: 6 10*3/uL (ref 1.4–7.0)
Neutrophils: 65 %
Platelets: 292 10*3/uL (ref 150–450)
RBC: 5.33 x10E6/uL (ref 4.14–5.80)
RDW: 15.9 % — ABNORMAL HIGH (ref 11.6–15.4)
WBC: 9.2 10*3/uL (ref 3.4–10.8)

## 2019-03-27 ENCOUNTER — Telehealth: Payer: Self-pay | Admitting: Nurse Practitioner

## 2019-03-27 NOTE — Telephone Encounter (Signed)
Patient aware of labs.  

## 2019-04-05 ENCOUNTER — Ambulatory Visit: Payer: BC Managed Care – PPO | Attending: Internal Medicine

## 2019-04-05 DIAGNOSIS — Z23 Encounter for immunization: Secondary | ICD-10-CM

## 2019-04-05 NOTE — Progress Notes (Signed)
   Covid-19 Vaccination Clinic  Name:  Cameron Noble    MRN: [id number] DOB: August 06, 1960  04/05/2019  Mr. Cameron Noble was observed post Covid-19 immunization for 15 minutes without incident. He was provided with Vaccine Information Sheet and instruction to access the V-Safe system.   Mr. Cameron Noble was instructed to call 911 with any severe reactions post vaccine: Marland Kitchen Difficulty breathing  . Swelling of face and throat  . A fast heartbeat  . A bad rash all over body  . Dizziness and weakness   Immunizations Administered    Name Date Dose VIS Date Route   Moderna COVID-19 Vaccine 04/05/2019  1:02 PM 0.5 mL 12/12/2018 Intramuscular   Manufacturer: Moderna   Lot: [id number]   [id number]

## 2019-05-03 ENCOUNTER — Ambulatory Visit: Payer: BC Managed Care – PPO | Attending: Internal Medicine

## 2019-05-03 DIAGNOSIS — Z23 Encounter for immunization: Secondary | ICD-10-CM

## 2019-05-03 NOTE — Progress Notes (Signed)
   Covid-19 Vaccination Clinic  Name:  Cameron Noble    MRN: [id number] DOB: Sep 09, 1960  05/03/2019  [name] was observed post Covid-19 immunization for 15 minutes without incident. He was provided with Vaccine Information Sheet and instruction to access the V-Safe system.   [name] was instructed to call 911 with any severe reactions post vaccine: Marland Kitchen Difficulty breathing  . Swelling of face and throat  . A fast heartbeat  . A bad rash all over body  . Dizziness and weakness   Immunizations Administered    Name Date Dose VIS Date Route   Moderna COVID-19 Vaccine 05/03/2019  1:08 PM 0.5 mL 12/2018 Intramuscular   Manufacturer: Moderna   Lot: [id number]   [id number]

## 2019-06-01 ENCOUNTER — Other Ambulatory Visit: Payer: Self-pay | Admitting: Nurse Practitioner

## 2019-06-01 DIAGNOSIS — E876 Hypokalemia: Secondary | ICD-10-CM

## 2019-06-27 ENCOUNTER — Ambulatory Visit: Payer: Self-pay | Admitting: Nurse Practitioner

## 2019-07-12 ENCOUNTER — Ambulatory Visit: Payer: BC Managed Care – PPO | Admitting: Nurse Practitioner

## 2019-07-12 ENCOUNTER — Encounter: Payer: Self-pay | Admitting: Nurse Practitioner

## 2019-07-12 ENCOUNTER — Other Ambulatory Visit: Payer: Self-pay

## 2019-07-12 VITALS — BP 136/89 | HR 74 | Temp 97.5°F | Resp 20 | Ht 67.0 in | Wt 223.0 lb

## 2019-07-12 DIAGNOSIS — E782 Mixed hyperlipidemia: Secondary | ICD-10-CM

## 2019-07-12 DIAGNOSIS — I10 Essential (primary) hypertension: Secondary | ICD-10-CM | POA: Diagnosis not present

## 2019-07-12 DIAGNOSIS — Z6829 Body mass index (BMI) 29.0-29.9, adult: Secondary | ICD-10-CM | POA: Diagnosis not present

## 2019-07-12 DIAGNOSIS — E1140 Type 2 diabetes mellitus with diabetic neuropathy, unspecified: Secondary | ICD-10-CM

## 2019-07-12 DIAGNOSIS — E876 Hypokalemia: Secondary | ICD-10-CM

## 2019-07-12 LAB — BAYER DCA HB A1C WAIVED: HB A1C (BAYER DCA - WAIVED): 6.4 % (ref <7.0)

## 2019-07-12 MED ORDER — GABAPENTIN 100 MG PO CAPS: ORAL_CAPSULE | ORAL | 1 refills | Status: DC

## 2019-07-12 MED ORDER — METFORMIN HCL 500 MG PO TABS: 500.0000 mg | ORAL_TABLET | Freq: Two times a day (BID) | ORAL | 1 refills | Status: DC

## 2019-07-12 MED ORDER — GLIPIZIDE ER 5 MG PO TB24: 5.0000 mg | ORAL_TABLET | Freq: Every day | ORAL | 1 refills | Status: DC

## 2019-07-12 MED ORDER — POTASSIUM CITRATE ER 15 MEQ (1620 MG) PO TBCR: 2.0000 | EXTENDED_RELEASE_TABLET | Freq: Two times a day (BID) | ORAL | 1 refills | Status: DC

## 2019-07-12 MED ORDER — ATORVASTATIN CALCIUM 10 MG PO TABS: 10.0000 mg | ORAL_TABLET | Freq: Every day | ORAL | 1 refills | Status: DC

## 2019-07-12 MED ORDER — LISINOPRIL 10 MG PO TABS: 10.0000 mg | ORAL_TABLET | Freq: Every day | ORAL | 1 refills | Status: DC

## 2019-07-12 NOTE — Patient Instructions (Signed)
DASH Eating Plan DASH stands for "Dietary Approaches to Stop Hypertension." The DASH eating plan is a healthy eating plan that has been shown to reduce high blood pressure (hypertension). It may also reduce your risk for type 2 diabetes, heart disease, and stroke. The DASH eating plan may also help with weight loss. What are tips for following this plan?  General guidelines  Avoid eating more than 2,300 mg (milligrams) of salt (sodium) a day. If you have hypertension, you may need to reduce your sodium intake to 1,500 mg a day.  Limit alcohol intake to no more than 1 drink a day for nonpregnant women and 2 drinks a day for men. One drink equals 12 oz of beer, 5 oz of wine, or 1 oz of hard liquor.  Work with your health care provider to maintain a healthy body weight or to lose weight. Ask what an ideal weight is for you.  Get at least 30 minutes of exercise that causes your heart to beat faster (aerobic exercise) most days of the week. Activities may include walking, swimming, or biking.  Work with your health care provider or diet and nutrition specialist (dietitian) to adjust your eating plan to your individual calorie needs. Reading food labels   Check food labels for the amount of sodium per serving. Choose foods with less than 5 percent of the Daily Value of sodium. Generally, foods with less than 300 mg of sodium per serving fit into this eating plan.  To find whole grains, look for the word "whole" as the first word in the ingredient list. Shopping  Buy products labeled as "low-sodium" or "no salt added."  Buy fresh foods. Avoid canned foods and premade or frozen meals. Cooking  Avoid adding salt when cooking. Use salt-free seasonings or herbs instead of table salt or sea salt. Check with your health care provider or pharmacist before using salt substitutes.  Do not fry foods. Cook foods using healthy methods such as baking, boiling, grilling, and broiling instead.  Cook with  heart-healthy oils, such as olive, canola, soybean, or sunflower oil. Meal planning  Eat a balanced diet that includes: ? 5 or more servings of fruits and vegetables each day. At each meal, try to fill half of your plate with fruits and vegetables. ? Up to 6-8 servings of whole grains each day. ? Less than 6 oz of lean meat, poultry, or fish each day. A 3-oz serving of meat is about the same size as a deck of cards. One egg equals 1 oz. ? 2 servings of low-fat dairy each day. ? A serving of nuts, seeds, or beans 5 times each week. ? Heart-healthy fats. Healthy fats called Omega-3 fatty acids are found in foods such as flaxseeds and coldwater fish, like sardines, salmon, and mackerel.  Limit how much you eat of the following: ? Canned or prepackaged foods. ? Food that is high in trans fat, such as fried foods. ? Food that is high in saturated fat, such as fatty meat. ? Sweets, desserts, sugary drinks, and other foods with added sugar. ? Full-fat dairy products.  Do not salt foods before eating.  Try to eat at least 2 vegetarian meals each week.  Eat more home-cooked food and less restaurant, buffet, and fast food.  When eating at a restaurant, ask that your food be prepared with less salt or no salt, if possible. What foods are recommended? The items listed may not be a complete list. Talk with your dietitian about   what dietary choices are best for you. Grains Whole-grain or whole-wheat bread. Whole-grain or whole-wheat pasta. Brown rice. Oatmeal. Quinoa. Bulgur. Whole-grain and low-sodium cereals. Pita bread. Low-fat, low-sodium crackers. Whole-wheat flour tortillas. Vegetables Fresh or frozen vegetables (raw, steamed, roasted, or grilled). Low-sodium or reduced-sodium tomato and vegetable juice. Low-sodium or reduced-sodium tomato sauce and tomato paste. Low-sodium or reduced-sodium canned vegetables. Fruits All fresh, dried, or frozen fruit. Canned fruit in natural juice (without  added sugar). Meat and other protein foods Skinless chicken or turkey. Ground chicken or turkey. Pork with fat trimmed off. Fish and seafood. Egg whites. Dried beans, peas, or lentils. Unsalted nuts, nut butters, and seeds. Unsalted canned beans. Lean cuts of beef with fat trimmed off. Low-sodium, lean deli meat. Dairy Low-fat (1%) or fat-free (skim) milk. Fat-free, low-fat, or reduced-fat cheeses. Nonfat, low-sodium ricotta or cottage cheese. Low-fat or nonfat yogurt. Low-fat, low-sodium cheese. Fats and oils Soft margarine without trans fats. Vegetable oil. Low-fat, reduced-fat, or light mayonnaise and salad dressings (reduced-sodium). Canola, safflower, olive, soybean, and sunflower oils. Avocado. Seasoning and other foods Herbs. Spices. Seasoning mixes without salt. Unsalted popcorn and pretzels. Fat-free sweets. What foods are not recommended? The items listed may not be a complete list. Talk with your dietitian about what dietary choices are best for you. Grains Baked goods made with fat, such as croissants, muffins, or some breads. Dry pasta or rice meal packs. Vegetables Creamed or fried vegetables. Vegetables in a cheese sauce. Regular canned vegetables (not low-sodium or reduced-sodium). Regular canned tomato sauce and paste (not low-sodium or reduced-sodium). Regular tomato and vegetable juice (not low-sodium or reduced-sodium). Pickles. Olives. Fruits Canned fruit in a light or heavy syrup. Fried fruit. Fruit in cream or butter sauce. Meat and other protein foods Fatty cuts of meat. Ribs. Fried meat. Bacon. Sausage. Bologna and other processed lunch meats. Salami. Fatback. Hotdogs. Bratwurst. Salted nuts and seeds. Canned beans with added salt. Canned or smoked fish. Whole eggs or egg yolks. Chicken or turkey with skin. Dairy Whole or 2% milk, cream, and half-and-half. Whole or full-fat cream cheese. Whole-fat or sweetened yogurt. Full-fat cheese. Nondairy creamers. Whipped toppings.  Processed cheese and cheese spreads. Fats and oils Butter. Stick margarine. Lard. Shortening. Ghee. Bacon fat. Tropical oils, such as coconut, palm kernel, or palm oil. Seasoning and other foods Salted popcorn and pretzels. Onion salt, garlic salt, seasoned salt, table salt, and sea salt. Worcestershire sauce. Tartar sauce. Barbecue sauce. Teriyaki sauce. Soy sauce, including reduced-sodium. Steak sauce. Canned and packaged gravies. Fish sauce. Oyster sauce. Cocktail sauce. Horseradish that you find on the shelf. Ketchup. Mustard. Meat flavorings and tenderizers. Bouillon cubes. Hot sauce and Tabasco sauce. Premade or packaged marinades. Premade or packaged taco seasonings. Relishes. Regular salad dressings. Where to find more information:  National Heart, Lung, and Blood Institute: www.nhlbi.nih.gov  American Heart Association: www.heart.org Summary  The DASH eating plan is a healthy eating plan that has been shown to reduce high blood pressure (hypertension). It may also reduce your risk for type 2 diabetes, heart disease, and stroke.  With the DASH eating plan, you should limit salt (sodium) intake to 2,300 mg a day. If you have hypertension, you may need to reduce your sodium intake to 1,500 mg a day.  When on the DASH eating plan, aim to eat more fresh fruits and vegetables, whole grains, lean proteins, low-fat dairy, and heart-healthy fats.  Work with your health care provider or diet and nutrition specialist (dietitian) to adjust your eating plan to your   individual calorie needs. This information is not intended to replace advice given to you by your health care provider. Make sure you discuss any questions you have with your health care provider. Document Revised: 12/10/2016 Document Reviewed: 12/22/2015 Elsevier Patient Education  2020 Elsevier Inc.  

## 2019-07-12 NOTE — Progress Notes (Signed)
Subjective:    Patient ID: Cameron Noble, male    DOB: November 11, 1960, 59 y.o.   MRN: [id number]  Chief Complaint: medical management of chronic issues     HPI:  1. Essential hypertension, benign BP Readings from Last 3 Encounters:  07/12/19 136/89  03/22/19 107/69  01/17/19 117/70   Denies checking BP at home, takes medication as prescribed. Watches Na+ intake. Walks for exercise. No chest pain, SOB, or headaches.    2. Type 2 diabetes mellitus with diabetic neuropathy, without long-term current use of insulin (HCC) Lab Results  Component Value Date   HGBA1C 7.8 (H) 03/22/2019   HA1C is 6.4 today. Watches carb & sugar intake. Checks feet regularly for wounds.   3. Mixed hyperlipidemia Lab Results  Component Value Date   CHOL 95 (L) 03/22/2019   HDL 37 (L) 03/22/2019   LDLCALC 44 03/22/2019   TRIG 62 03/22/2019   CHOLHDL 2.6 03/22/2019   Takes medication as prescribed. Avoids fatty and fried foods mostly.   4. BMI 29.0-29.9,adult BMI Readings from Last 3 Encounters:  07/12/19 34.93 kg/m  03/22/19 35.08 kg/m  01/17/19 34.77 kg/m   Wt Readings from Last 3 Encounters:  07/12/19 223 lb (101.2 kg)  03/22/19 224 lb (101.6 kg)  01/17/19 222 lb (100.7 kg)   Weight is stable. Watches diet for the most part. Walks.    Outpatient Encounter Medications as of 07/12/2019  Medication Sig  . ammonium lactate (AMLACTIN) 12 % cream APPLY TO DRY SKIN ON BOTTOM OF FEET 2 TIMES A DAY  . aspirin 81 MG tablet Take 81 mg by mouth daily.  Marland Kitchen atorvastatin (LIPITOR) 10 MG tablet Take 1 tablet (10 mg total) by mouth daily.  . Cholecalciferol (VITAMIN D-3 PO) Take 1 tablet by mouth daily.  . ciclopirox (PENLAC) 8 % solution Apply topically at bedtime. Apply over nail and surrounding skin. Apply daily over previous coat. After seven (7) days, may remove with alcohol and continue cycle.  . fish oil-omega-3 fatty acids 1000 MG capsule Take 2 g by mouth daily.  Marland Kitchen gabapentin (NEURONTIN) 100 MG  capsule TAKE 1 TO 2 CAPSULES BY MOUTH AT BEDTIME  . glipiZIDE (GLUCOTROL XL) 5 MG 24 hr tablet Take 1 tablet (5 mg total) by mouth daily with breakfast.  . lisinopril (ZESTRIL) 10 MG tablet Take 1 tablet (10 mg total) by mouth daily.  . meloxicam (MOBIC) 7.5 MG tablet Take 7.5 mg by mouth daily.  . metFORMIN (GLUCOPHAGE) 500 MG tablet Take 1 tablet (500 mg total) by mouth 2 (two) times daily with a meal.  . Potassium Citrate 15 MEQ (1620 MG) TBCR TAKE 2 TABLETS BY MOUTH  TWICE DAILY   No facility-administered encounter medications on file as of 07/12/2019.    Past Surgical History:  Procedure Laterality Date  . BACK SURGERY  03/2002   Dr. Trenton Gammon   . CYSTOSCOPY WITH STENT PLACEMENT Left 01/27/2015   Procedure: CYSTOSCOPY, RETROGRADE, WITH LEFT STENT PLACEMENT;  Surgeon: Irine Seal, MD;  Location: [facility];  Service: Urology;  Laterality: Left;  I have 2 cases at [facility] and a foley to place at [facility].  I would guess it will be 830-9 before I get up to [facility].   Marland Kitchen DDD C-Spine Repair    . KNEE ARTHROSCOPY Right     Family History  Problem Relation Age of Onset  . Diabetes Mother   . Hypertension Mother   . Colon cancer Neg Hx   . Esophageal cancer Neg Hx   .  Rectal cancer Neg Hx   . Stomach cancer Neg Hx     New complaints: C/O upper right sided scapula pain that radiates towards shoulder that he attributes to overuse at work with repetitive movement.   Social history: Lives at home with wife, watches grandchildren often. Works two jobs.   Controlled substance contract: n/a    Review of Systems  Constitutional: Negative.   HENT: Negative.   Eyes: Negative.   Respiratory: Negative.   Cardiovascular: Negative.   Gastrointestinal: Negative.   Endocrine: Negative.   Genitourinary: Negative.   Musculoskeletal: Negative.   Skin: Negative.   Allergic/Immunologic: Negative.   Neurological: Negative.   Hematological: Negative.   Psychiatric/Behavioral: Negative.   All other systems reviewed  and are negative.      Objective:   Physical Exam Vitals and nursing note reviewed.  Constitutional:      Appearance: Normal appearance. He is normal weight.  HENT:     Head: Normocephalic and atraumatic.     Right Ear: Tympanic membrane, ear canal and external ear normal.     Left Ear: Tympanic membrane, ear canal and external ear normal.     Nose: Nose normal.     Mouth/Throat:     Mouth: Mucous membranes are moist.     Pharynx: Oropharynx is clear.  Eyes:     Extraocular Movements: Extraocular movements intact.     Conjunctiva/sclera: Conjunctivae normal.     Pupils: Pupils are equal, round, and reactive to light.  Cardiovascular:     Rate and Rhythm: Normal rate and regular rhythm.     Pulses: Normal pulses.     Heart sounds: Normal heart sounds.  Pulmonary:     Effort: Pulmonary effort is normal.     Breath sounds: Normal breath sounds.  Abdominal:     General: Bowel sounds are normal.     Palpations: Abdomen is soft.  Musculoskeletal:        General: Normal range of motion.     Cervical back: Normal range of motion and neck supple.  Skin:    General: Skin is warm and dry.     Capillary Refill: Capillary refill takes less than 2 seconds.  Neurological:     General: No focal deficit present.     Mental Status: He is alert and oriented to person, place, and time. Mental status is at baseline.  Psychiatric:        Mood and Affect: Mood normal.        Behavior: Behavior normal.        Thought Content: Thought content normal.        Judgment: Judgment normal.    BP 136/89   Pulse 74   Temp (!) 97.5 F (36.4 C) (Temporal)   Resp 20   Ht 5' 7"  (1.702 m)   Wt 223 lb (101.2 kg)   SpO2 98%   BMI 34.93 kg/m  hgba1c 6.4%     Assessment & Plan:  Cameron Noble comes in today with chief complaint of No chief complaint on file.   Diagnosis and orders addressed:  1. Essential hypertension, benign Follow heart healthy diet with limited salt. Take medication as  prescribed. Exercise regularly. Walking is a great cardiovascular exercise.   - CBC with Differential/Platelet - CMP14+EGFR  2. Type 2 diabetes mellitus with diabetic neuropathy, without long-term current use of insulin (HCC) Continue to avoid high sugar and carb foods. Check blood glucose regularly. Take medication as prescribed. Drink plenty of water. Check  feet on a regular basis for wounds.   - Bayer DCA Hb A1c Waived  3. Mixed hyperlipidemia Avoid foods high in fat or fried foods. Continue taking medications.   - Lipid panel  4. BMI 29.0-29.9,adult Please exercise 3-5 times per week. Eat a diet low in salt, sugar, and carbs.   Meds ordered this encounter  Medications  . lisinopril (ZESTRIL) 10 MG tablet    Sig: Take 1 tablet (10 mg total) by mouth daily.    Dispense:  90 tablet    Refill:  1    Order Specific Question:   Supervising Provider    Answer:   Caryl Pina A [id number]  . Potassium Citrate 15 MEQ (1620 MG) TBCR    Sig: Take 2 tablets by mouth 2 (two) times daily.    Dispense:  360 tablet    Refill:  1    Order Specific Question:   Supervising Provider    Answer:   Caryl Pina A [id number]  . atorvastatin (LIPITOR) 10 MG tablet    Sig: Take 1 tablet (10 mg total) by mouth daily.    Dispense:  90 tablet    Refill:  1    Order Specific Question:   Supervising Provider    Answer:   Caryl Pina A [id number]  . metFORMIN (GLUCOPHAGE) 500 MG tablet    Sig: Take 1 tablet (500 mg total) by mouth 2 (two) times daily with a meal.    Dispense:  180 tablet    Refill:  1    Order Specific Question:   Supervising Provider    Answer:   Caryl Pina A [[id number]]  . glipiZIDE (GLUCOTROL XL) 5 MG 24 hr tablet    Sig: Take 1 tablet (5 mg total) by mouth daily with breakfast.    Dispense:  90 tablet    Refill:  1    Order Specific Question:   Supervising Provider    Answer:   Caryl Pina A [id number]  . gabapentin (NEURONTIN) 100 MG capsule    Sig:  TAKE 1 TO 2 CAPSULES BY MOUTH AT BEDTIME    Dispense:  180 capsule    Refill:  1    Order Specific Question:   Supervising Provider    Answer:   Caryl Pina A [id number]    Labs pending Health Maintenance reviewed Diet and exercise encouraged  Follow up plan: 3 month follow up.   [name], FNP

## 2019-07-13 LAB — LIPID PANEL
Chol/HDL Ratio: 3.3 ratio (ref 0.0–5.0)
Cholesterol, Total: 117 mg/dL (ref 100–199)
HDL: 36 mg/dL — ABNORMAL LOW (ref >39)
LDL Chol Calc (NIH): 62 mg/dL (ref 0–99)
Triglycerides: 102 mg/dL (ref 0–149)
VLDL Cholesterol Cal: 19 mg/dL (ref 5–40)

## 2019-07-13 LAB — CMP14+EGFR
ALT: 21 IU/L (ref 0–44)
AST: 21 IU/L (ref 0–40)
Albumin/Globulin Ratio: 1.2 (ref 1.2–2.2)
Albumin: 4.1 g/dL (ref 3.8–4.8)
Alkaline Phosphatase: 110 IU/L (ref 48–121)
BUN/Creatinine Ratio: 12 (ref 10–24)
BUN: 17 mg/dL (ref 8–27)
Bilirubin Total: 0.4 mg/dL (ref 0.0–1.2)
CO2: 24 mmol/L (ref 20–29)
Calcium: 9.6 mg/dL (ref 8.6–10.2)
Chloride: 107 mmol/L — ABNORMAL HIGH (ref 96–106)
Creatinine, Ser: 1.38 mg/dL — ABNORMAL HIGH (ref 0.76–1.27)
GFR calc Af Amer: 63 mL/min/{1.73_m2} (ref >59)
GFR calc non Af Amer: 55 mL/min/{1.73_m2} — ABNORMAL LOW (ref >59)
Globulin, Total: 3.3 g/dL (ref 1.5–4.5)
Glucose: 150 mg/dL — ABNORMAL HIGH (ref 65–99)
Potassium: 4.3 mmol/L (ref 3.5–5.2)
Sodium: 142 mmol/L (ref 134–144)
Total Protein: 7.4 g/dL (ref 6.0–8.5)

## 2019-07-13 LAB — CBC WITH DIFFERENTIAL/PLATELET
Basophils Absolute: 0.1 10*3/uL (ref 0.0–0.2)
Basos: 1 %
EOS (ABSOLUTE): 0.2 10*3/uL (ref 0.0–0.4)
Eos: 2 %
Hematocrit: 43 % (ref 37.5–51.0)
Hemoglobin: 14.1 g/dL (ref 13.0–17.7)
Immature Grans (Abs): 0 10*3/uL (ref 0.0–0.1)
Immature Granulocytes: 0 %
Lymphocytes Absolute: 2.7 10*3/uL (ref 0.7–3.1)
Lymphs: 28 %
MCH: 27.3 pg (ref 26.6–33.0)
MCHC: 32.8 g/dL (ref 31.5–35.7)
MCV: 83 fL (ref 79–97)
Monocytes Absolute: 1 10*3/uL — ABNORMAL HIGH (ref 0.1–0.9)
Monocytes: 10 %
Neutrophils Absolute: 5.8 10*3/uL (ref 1.4–7.0)
Neutrophils: 59 %
Platelets: 264 10*3/uL (ref 150–450)
RBC: 5.17 x10E6/uL (ref 4.14–5.80)
RDW: 14.8 % (ref 11.6–15.4)
WBC: 9.8 10*3/uL (ref 3.4–10.8)

## 2019-07-19 ENCOUNTER — Telehealth: Payer: Self-pay | Admitting: Nurse Practitioner

## 2019-07-19 NOTE — Telephone Encounter (Signed)
Pt returning a call to the nurse for lab results.

## 2019-07-19 NOTE — Telephone Encounter (Signed)
Details of lab results given on patient's voice mail.  Call back for questions.

## 2019-07-30 ENCOUNTER — Encounter: Payer: Self-pay | Admitting: Family Medicine

## 2019-07-30 ENCOUNTER — Ambulatory Visit (INDEPENDENT_AMBULATORY_CARE_PROVIDER_SITE_OTHER): Payer: BC Managed Care – PPO | Admitting: Family Medicine

## 2019-07-30 DIAGNOSIS — R0981 Nasal congestion: Secondary | ICD-10-CM

## 2019-07-30 DIAGNOSIS — K30 Functional dyspepsia: Secondary | ICD-10-CM

## 2019-07-30 NOTE — Progress Notes (Signed)
Virtual Visit via telephone Note  I connected with Cameron Noble on 07/30/19 at 1252 by telephone and verified that I am speaking with the correct person using two identifiers. Cameron Noble is currently located at home and no other people are currently with her during visit. The provider, Fransisca Kaufmann Kolt Mcwhirter, MD is located in their office at time of visit.  Call ended at 1302  I discussed the limitations, risks, security and privacy concerns of performing an evaluation and management service by telephone and the availability of in person appointments. I also discussed with the patient that there may be a patient responsible charge related to this service. The patient expressed understanding and agreed to proceed.   History and Present Illness: Patient is calling in for 3 days of symptoms and went in but wasn't seen.  He is having sinus headaches and temp of 99.2 and felt warm.  He is having some stomach issues and decreased appetite.  He felt slight stomach pains for a few seconds that passed and he was not eating as much Friday and Saturday.  He had a lot of drainage and is taking antacid OTC and alkaseltzer.  He took some ibuprofen for headache. The symptoms are slightly improved today.  He has sinus flonase and it is helping. He has been resting and feeling better today.   Outpatient Encounter Medications as of 07/30/2019  Medication Sig  . ammonium lactate (AMLACTIN) 12 % cream APPLY TO DRY SKIN ON BOTTOM OF FEET 2 TIMES A DAY  . aspirin 81 MG tablet Take 81 mg by mouth daily.  Marland Kitchen atorvastatin (LIPITOR) 10 MG tablet Take 1 tablet (10 mg total) by mouth daily.  Marland Kitchen azelastine (OPTIVAR) 0.05 % ophthalmic solution SMARTSIG:1 Drop(s) In Eye(s) Every 12 Hours  . Cholecalciferol (VITAMIN D-3 PO) Take 1 tablet by mouth daily.  . ciclopirox (PENLAC) 8 % solution Apply topically at bedtime. Apply over nail and surrounding skin. Apply daily over previous coat. After seven (7) days, may remove with  alcohol and continue cycle.  . fish oil-omega-3 fatty acids 1000 MG capsule Take 2 g by mouth daily.  Marland Kitchen gabapentin (NEURONTIN) 100 MG capsule TAKE 1 TO 2 CAPSULES BY MOUTH AT BEDTIME  . glipiZIDE (GLUCOTROL XL) 5 MG 24 hr tablet Take 1 tablet (5 mg total) by mouth daily with breakfast.  . lisinopril (ZESTRIL) 10 MG tablet Take 1 tablet (10 mg total) by mouth daily.  . meloxicam (MOBIC) 7.5 MG tablet Take 7.5 mg by mouth daily.  . metFORMIN (GLUCOPHAGE) 500 MG tablet Take 1 tablet (500 mg total) by mouth 2 (two) times daily with a meal.  . Potassium Citrate 15 MEQ (1620 MG) TBCR Take 2 tablets by mouth 2 (two) times daily.   No facility-administered encounter medications on file as of 07/30/2019.    Review of Systems  Constitutional: Negative for chills and fever.  HENT: Positive for congestion, postnasal drip, rhinorrhea, sinus pressure, sneezing and sore throat. Negative for ear discharge, ear pain and voice change.   Eyes: Negative for pain, discharge, redness and visual disturbance.  Respiratory: Positive for cough. Negative for shortness of breath and wheezing.   Cardiovascular: Negative for chest pain and leg swelling.  Gastrointestinal: Positive for abdominal pain and nausea. Negative for vomiting.  Musculoskeletal: Negative for gait problem.  Skin: Negative for rash.  Neurological: Positive for headaches. Negative for dizziness and weakness.  All other systems reviewed and are negative.   Observations/Objective: Patient sounds comfortable.  Assessment and  Plan: Problem List Items Addressed This Visit    None    Visit Diagnoses    Sinus congestion    -  Primary   Acid indigestion          Continue with Flonase and antiacid medicine, it seems like he is already improving.  If anything worsens or changes then let us know.  Gave work note Follow up plan: Return if symptoms worsen or fail to improve.     I discussed the assessment and treatment plan with the patient. The  patient was provided an opportunity to ask questions and all were answered. The patient agreed with the plan and demonstrated an understanding of the instructions.   The patient was advised to call back or seek an in-person evaluation if the symptoms worsen or if the condition fails to improve as anticipated.  The above assessment and management plan was discussed with the patient. The patient verbalized understanding of and has agreed to the management plan. Patient is aware to call the clinic if symptoms persist or worsen. Patient is aware when to return to the clinic for a follow-up visit. Patient educated on when it is appropriate to go to the emergency department.    I provided 10 minutes of non-face-to-face time during this encounter.    Worthy Rancher, MD

## 2019-08-16 ENCOUNTER — Ambulatory Visit: Payer: BC Managed Care – PPO | Admitting: Nurse Practitioner

## 2019-08-16 ENCOUNTER — Other Ambulatory Visit: Payer: Self-pay

## 2019-08-16 ENCOUNTER — Encounter: Payer: Self-pay | Admitting: Nurse Practitioner

## 2019-08-16 VITALS — BP 126/85 | HR 74 | Temp 98.3°F | Resp 20 | Ht 67.0 in | Wt 218.0 lb

## 2019-08-16 DIAGNOSIS — R079 Chest pain, unspecified: Secondary | ICD-10-CM | POA: Diagnosis not present

## 2019-08-16 DIAGNOSIS — R1013 Epigastric pain: Secondary | ICD-10-CM

## 2019-08-16 MED ORDER — OMEPRAZOLE 40 MG PO CPDR: 40.0000 mg | DELAYED_RELEASE_CAPSULE | Freq: Every day | ORAL | 3 refills | Status: DC

## 2019-08-16 NOTE — Patient Instructions (Signed)
Gastritis, Adult  Gastritis is swelling (inflammation) of the stomach. Gastritis can develop quickly (acute). It can also develop slowly over time (chronic). It is important to get help for this condition. If you do not get help, your stomach can bleed, and you can get sores (ulcers) in your stomach. What are the causes? This condition may be caused by:  Germs that get to your stomach.  Drinking too much alcohol.  Medicines you are taking.  Too much acid in the stomach.  A disease of the intestines or stomach.  Stress.  An allergic reaction.  Crohn's disease.  Some cancer treatments (radiation). Sometimes the cause of this condition is not known. What are the signs or symptoms? Symptoms of this condition include:  Pain in your stomach.  A burning feeling in your stomach.  Feeling sick to your stomach (nauseous).  Throwing up (vomiting).  Feeling too full after you eat.  Weight loss.  Bad breath.  Throwing up blood.  Blood in your poop (stool). How is this diagnosed? This condition may be diagnosed with:  Your medical history and symptoms.  A physical exam.  Tests. These can include: ? Blood tests. ? Stool tests. ? A procedure to look inside your stomach (upper endoscopy). ? A test in which a sample of tissue is taken for testing (biopsy). How is this treated? Treatment for this condition depends on what caused it. You may be given:  Antibiotic medicine, if your condition was caused by germs.  H2 blockers and similar medicines, if your condition was caused by too much acid. Follow these instructions at home: Medicines  Take over-the-counter and prescription medicines only as told by your doctor.  If you were prescribed an antibiotic medicine, take it as told by your doctor. Do not stop taking it even if you start to feel better. Eating and drinking   Eat small meals often, instead of large meals.  Avoid foods and drinks that make your symptoms  worse.  Drink enough fluid to keep your pee (urine) pale yellow. Alcohol use  Do not drink alcohol if: ? Your doctor tells you not to drink. ? You are pregnant, may be pregnant, or are planning to become pregnant.  If you drink alcohol: ? Limit your use to:  0-1 drink a day for women.  0-2 drinks a day for men. ? Be aware of how much alcohol is in your drink. In the [place] one drink equals one 12 oz bottle of beer (355 mL), one 5 oz glass of wine (148 mL), or one 1 oz glass of hard liquor (44 mL). General instructions  Talk with your doctor about ways to manage stress. You can exercise or do deep breathing, meditation, or yoga.  Do not smoke or use products that have nicotine or tobacco. If you need help quitting, ask your doctor.  Keep all follow-up visits as told by your doctor. This is important. Contact a doctor if:  Your symptoms get worse.  Your symptoms go away and then come back. Get help right away if:  You throw up blood or something that looks like coffee grounds.  You have black or dark red poop.  You throw up any time you try to drink fluids.  Your stomach pain gets worse.  You have a fever.  You do not feel better after one week. Summary  Gastritis is swelling (inflammation) of the stomach.  You must get help for this condition. If you do not get help, your stomach   can bleed, and you can get sores (ulcers).  This condition is diagnosed with medical history, physical exam, or tests.  You can be treated with medicines for germs or medicines to block too much acid in your stomach. This information is not intended to replace advice given to you by your health care provider. Make sure you discuss any questions you have with your health care provider. Document Revised: 05/17/2017 Document Reviewed: 05/17/2017 Elsevier Patient Education  2020 Elsevier Inc.  

## 2019-08-16 NOTE — Progress Notes (Signed)
   Subjective:    Patient ID: Cameron Noble, male    DOB: [birth year], 59 y.o.   MRN: [id number]   Chief Complaint: Abdominal Pain (moves up in chest at times)   HPI Patient come sin c/o of abdominal pain off and on for the last month. Has had nausea and vomiting with it a couple of times. He has not noticed if eating makes a difference in pain. He said it may make it better if he eats something. He denies sob but has fatigue.   Review of Systems  Constitutional: Positive for fatigue. Negative for appetite change and chills.  Respiratory: Positive for chest tightness and shortness of breath.   Cardiovascular: Positive for chest pain.  Gastrointestinal: Positive for abdominal pain, constipation (slight) and nausea. Negative for vomiting.  Genitourinary: Negative for dysuria, flank pain and urgency.  Neurological: Negative.   Psychiatric/Behavioral: Negative.   All other systems reviewed and are negative.      Objective:   Physical Exam Vitals and nursing note reviewed.  Constitutional:      Appearance: He is well-developed.  Cardiovascular:     Rate and Rhythm: Normal rate and regular rhythm.  Pulmonary:     Effort: Pulmonary effort is normal.     Breath sounds: Normal breath sounds.  Abdominal:     General: Abdomen is flat. Bowel sounds are normal.     Palpations: Abdomen is soft.     Tenderness: There is abdominal tenderness in the epigastric area. There is no guarding. Negative signs include Murphy's sign.  Skin:    General: Skin is warm.  Neurological:     General: No focal deficit present.     Mental Status: He is alert.    BP 126/85   Pulse 74   Temp 98.3 F (36.8 C) (Temporal)   Resp 20   Ht 5\' 7"  (1.702 m)   Wt 218 lb (98.9 kg)   SpO2 98%   BMI 34.14 kg/m   Adella Nissen, FNP       Assessment & Plan:  Cameron Noble in today with chief complaint of Abdominal Pain (moves up in chest at times)   1. Chest pain, unspecified type  - EKG  12-Lead  2. Epigastric pain probable gastritis First 24 Hours-Clear liquids  popsicles  Jello  gatorade  Sprite Second 24 hours-Add Full liquids ( Liquids you cant see through) Third 24 hours- Bland diet ( foods that are baked or broiled)  *avoiding fried foods and highly spiced foods* During these 3 days  Avoid milk, cheese, ice cream or any other dairy products  Avoid caffeine- REMEMBER Mt. Dew and Mello Yellow contain lots of caffeine You should eat and drink in  Frequent small volumes If no improvement in symptoms or worsen in 2-3 days should RETRUN TO OFFICE or go to ER! If no improvement with meds , will order GB U/S     - omeprazole (PRILOSEC) 40 MG capsule; Take 1 capsule (40 mg total) by mouth daily.  Dispense: 30 capsule; Refill: 3 - H Pylori, IGM, IGG, IGA AB    The above assessment and management plan was discussed with the patient. The patient verbalized understanding of and has agreed to the management plan. Patient is aware to call the clinic if symptoms persist or worsen. Patient is aware when to return to the clinic for a follow-up visit. Patient educated on when it is appropriate to go to the emergency department.   [name], FNP

## 2019-08-17 LAB — H PYLORI, IGM, IGG, IGA AB
H pylori, IgM Abs: <9 units (ref 0.0–8.9)
H. pylori, IgA Abs: 25.9 units — ABNORMAL HIGH (ref 0.0–8.9)
H. pylori, IgG AbS: 0.45 Index Value (ref 0.00–0.79)

## 2019-08-20 MED ORDER — CLARITHROMYCIN 500 MG PO TABS: 500.0000 mg | ORAL_TABLET | Freq: Two times a day (BID) | ORAL | 0 refills | Status: DC

## 2019-08-20 MED ORDER — AMOXICILLIN 500 MG PO CAPS
500.0000 mg | ORAL_CAPSULE | Freq: Two times a day (BID) | ORAL | 0 refills | Status: DC
Start: 2019-08-20 — End: 2019-11-14

## 2019-08-20 MED ORDER — OMEPRAZOLE 40 MG PO CPDR
40.0000 mg | DELAYED_RELEASE_CAPSULE | Freq: Two times a day (BID) | ORAL | 0 refills | Status: DC
Start: 2019-08-20 — End: 2020-06-12

## 2019-08-20 NOTE — Addendum Note (Signed)
Addended by: Chevis Pretty on: 08/20/2019 09:30 AM   Modules accepted: Orders

## 2019-08-23 ENCOUNTER — Telehealth: Payer: Self-pay | Admitting: Nurse Practitioner

## 2019-08-23 NOTE — Telephone Encounter (Signed)
Patient aware of results.

## 2019-09-04 ENCOUNTER — Telehealth: Payer: Self-pay | Admitting: Nurse Practitioner

## 2019-09-04 NOTE — Telephone Encounter (Signed)
Left message to call back  

## 2019-09-05 NOTE — Telephone Encounter (Signed)
Patient was given Omeprazole BID on 08/20/19 due to his H Pylori test result.  Patient would like to know if after he finished the rx should he go back to taking it daily ?Covering PCP-please advise

## 2019-09-05 NOTE — Telephone Encounter (Signed)
Should be twice daily until he completes H Pylori antibiotics.  Once completed, can go back to 1 time per day

## 2019-09-05 NOTE — Telephone Encounter (Signed)
lmtcb

## 2019-09-18 NOTE — Telephone Encounter (Signed)
Lmtcb   Attempts have been made to patient and no call back- this encounter will be closed.

## 2019-10-16 ENCOUNTER — Ambulatory Visit: Payer: Self-pay | Admitting: Nurse Practitioner

## 2019-10-22 DIAGNOSIS — Z23 Encounter for immunization: Secondary | ICD-10-CM | POA: Diagnosis not present

## 2019-11-14 ENCOUNTER — Encounter: Payer: Self-pay | Admitting: Nurse Practitioner

## 2019-11-14 ENCOUNTER — Ambulatory Visit: Payer: BC Managed Care – PPO | Admitting: Nurse Practitioner

## 2019-11-14 ENCOUNTER — Other Ambulatory Visit: Payer: Self-pay

## 2019-11-14 VITALS — BP 120/82 | HR 71 | Temp 97.2°F | Resp 20 | Ht 67.0 in | Wt 221.0 lb

## 2019-11-14 DIAGNOSIS — E782 Mixed hyperlipidemia: Secondary | ICD-10-CM

## 2019-11-14 DIAGNOSIS — E876 Hypokalemia: Secondary | ICD-10-CM

## 2019-11-14 DIAGNOSIS — G8929 Other chronic pain: Secondary | ICD-10-CM

## 2019-11-14 DIAGNOSIS — E1140 Type 2 diabetes mellitus with diabetic neuropathy, unspecified: Secondary | ICD-10-CM | POA: Diagnosis not present

## 2019-11-14 DIAGNOSIS — M549 Dorsalgia, unspecified: Secondary | ICD-10-CM

## 2019-11-14 DIAGNOSIS — I10 Essential (primary) hypertension: Secondary | ICD-10-CM

## 2019-11-14 DIAGNOSIS — Z6829 Body mass index (BMI) 29.0-29.9, adult: Secondary | ICD-10-CM

## 2019-11-14 LAB — BAYER DCA HB A1C WAIVED: HB A1C (BAYER DCA - WAIVED): 6.8 % (ref <7.0)

## 2019-11-14 MED ORDER — ATORVASTATIN CALCIUM 10 MG PO TABS: 10.0000 mg | ORAL_TABLET | Freq: Every day | ORAL | 1 refills | Status: DC

## 2019-11-14 MED ORDER — LISINOPRIL 10 MG PO TABS: 10.0000 mg | ORAL_TABLET | Freq: Every day | ORAL | 1 refills | Status: DC

## 2019-11-14 MED ORDER — GABAPENTIN 100 MG PO CAPS: ORAL_CAPSULE | ORAL | 1 refills | Status: DC

## 2019-11-14 MED ORDER — METFORMIN HCL 500 MG PO TABS: 500.0000 mg | ORAL_TABLET | Freq: Two times a day (BID) | ORAL | 1 refills | Status: DC

## 2019-11-14 MED ORDER — GLIPIZIDE ER 5 MG PO TB24: 5.0000 mg | ORAL_TABLET | Freq: Every day | ORAL | 1 refills | Status: DC

## 2019-11-14 MED ORDER — POTASSIUM CITRATE ER 15 MEQ (1620 MG) PO TBCR: 2.0000 | EXTENDED_RELEASE_TABLET | Freq: Two times a day (BID) | ORAL | 1 refills | Status: DC

## 2019-11-14 NOTE — Patient Instructions (Signed)
Diabetes Mellitus and Foot Care Foot care is an important part of your health, especially when you have diabetes. Diabetes may cause you to have problems because of poor blood flow (circulation) to your feet and legs, which can cause your skin to:  Become thinner and drier.  Break more easily.  Heal more slowly.  Peel and crack. You may also have nerve damage (neuropathy) in your legs and feet, causing decreased feeling in them. This means that you may not notice minor injuries to your feet that could lead to more serious problems. Noticing and addressing any potential problems early is the best way to prevent future foot problems. How to care for your feet Foot hygiene  Wash your feet daily with warm water and mild soap. Do not use hot water. Then, pat your feet and the areas between your toes until they are completely dry. Do not soak your feet as this can dry your skin.  Trim your toenails straight across. Do not dig under them or around the cuticle. File the edges of your nails with an emery board or nail file.  Apply a moisturizing lotion or petroleum jelly to the skin on your feet and to dry, brittle toenails. Use lotion that does not contain alcohol and is unscented. Do not apply lotion between your toes. Shoes and socks  Wear clean socks or stockings every day. Make sure they are not too tight. Do not wear knee-high stockings since they may decrease blood flow to your legs.  Wear shoes that fit properly and have enough cushioning. Always look in your shoes before you put them on to be sure there are no objects inside.  To break in new shoes, wear them for just a few hours a day. This prevents injuries on your feet. Wounds, scrapes, corns, and calluses  Check your feet daily for blisters, cuts, bruises, sores, and redness. If you cannot see the bottom of your feet, use a mirror or ask someone for help.  Do not cut corns or calluses or try to remove them with medicine.  If you  find a minor scrape, cut, or break in the skin on your feet, keep it and the skin around it clean and dry. You may clean these areas with mild soap and water. Do not clean the area with peroxide, alcohol, or iodine.  If you have a wound, scrape, corn, or callus on your foot, look at it several times a day to make sure it is healing and not infected. Check for: ? Redness, swelling, or pain. ? Fluid or blood. ? Warmth. ? Pus or a bad smell. General instructions  Do not cross your legs. This may decrease blood flow to your feet.  Do not use heating pads or hot water bottles on your feet. They may burn your skin. If you have lost feeling in your feet or legs, you may not know this is happening until it is too late.  Protect your feet from hot and cold by wearing shoes, such as at the beach or on hot pavement.  Schedule a complete foot exam at least once a year (annually) or more often if you have foot problems. If you have foot problems, report any cuts, sores, or bruises to your health care provider immediately. Contact a health care provider if:  You have a medical condition that increases your risk of infection and you have any cuts, sores, or bruises on your feet.  You have an injury that is not   healing.  You have redness on your legs or feet.  You feel burning or tingling in your legs or feet.  You have pain or cramps in your legs and feet.  Your legs or feet are numb.  Your feet always feel cold.  You have pain around a toenail. Get help right away if:  You have a wound, scrape, corn, or callus on your foot and: ? You have pain, swelling, or redness that gets worse. ? You have fluid or blood coming from the wound, scrape, corn, or callus. ? Your wound, scrape, corn, or callus feels warm to the touch. ? You have pus or a bad smell coming from the wound, scrape, corn, or callus. ? You have a fever. ? You have a red line going up your leg. Summary  Check your feet every day  for cuts, sores, red spots, swelling, and blisters.  Moisturize feet and legs daily.  Wear shoes that fit properly and have enough cushioning.  If you have foot problems, report any cuts, sores, or bruises to your health care provider immediately.  Schedule a complete foot exam at least once a year (annually) or more often if you have foot problems. This information is not intended to replace advice given to you by your health care provider. Make sure you discuss any questions you have with your health care provider. Document Revised: 09/20/2018 Document Reviewed: 01/30/2016 Elsevier Patient Education  2020 [employer]  

## 2019-11-14 NOTE — Progress Notes (Signed)
Subjective:    Patient ID: Cameron Noble, male    DOB: 07-24-1960, 59 y.o.   MRN: [id number]   Chief Complaint: medical management of chronic issues     HPI:  1. Essential hypertension, benign No c/o chest pain, sob or headache. Does not check blood pressure at home. BP Readings from Last 3 Encounters:  08/16/19 126/85  07/12/19 136/89  03/22/19 107/69     2. Mixed hyperlipidemia Doe snot watch diet and does little to no dedicated exercise. He says he does stay active however. Lab Results  Component Value Date   CHOL 117 07/12/2019   HDL 36 (L) 07/12/2019   LDLCALC 62 07/12/2019   TRIG 102 07/12/2019   CHOLHDL 3.3 07/12/2019     3. Hypokalemia Is on a daily potassium supplement. He denies any muscle cramping Lab Results  Component Value Date   K 4.3 07/12/2019      4. Type 2 diabetes mellitus with diabetic neuropathy, without long-term current use of insulin (HCC) fasting blood sugars are running normal he says, he does not chek it but maybe 1-2 x a week. He denies nay low blood sugars. Lab Results  Component Value Date   HGBA1C 6.4 07/12/2019     5. Chronic back pain, unspecified back location, unspecified back pain laterality He says it is tolerable. He still tries t stay active. Has occasional numbness in bik lower ext if he stands for long periods of time.  6. BMI 29.0-29.9,adult No recent weight changes  Wt Readings from Last 3 Encounters:  11/14/19 221 lb (100.2 kg)  08/16/19 218 lb (98.9 kg)  07/12/19 223 lb (101.2 kg)   BMI Readings from Last 3 Encounters:  11/14/19 34.61 kg/m  08/16/19 34.14 kg/m  07/12/19 34.93 kg/m     Outpatient Encounter Medications as of 11/14/2019  Medication Sig  . ammonium lactate (AMLACTIN) 12 % cream APPLY TO DRY SKIN ON BOTTOM OF FEET 2 TIMES A DAY  . amoxicillin (AMOXIL) 500 MG capsule Take 1 capsule (500 mg total) by mouth 2 (two) times daily.  Marland Kitchen aspirin 81 MG tablet Take 81 mg by mouth daily.  Marland Kitchen  atorvastatin (LIPITOR) 10 MG tablet Take 1 tablet (10 mg total) by mouth daily.  Marland Kitchen azelastine (OPTIVAR) 0.05 % ophthalmic solution SMARTSIG:1 Drop(s) In Eye(s) Every 12 Hours  . Cholecalciferol (VITAMIN D-3 PO) Take 1 tablet by mouth daily.  . ciclopirox (PENLAC) 8 % solution Apply topically at bedtime. Apply over nail and surrounding skin. Apply daily over previous coat. After seven (7) days, may remove with alcohol and continue cycle.  . clarithromycin (BIAXIN) 500 MG tablet Take 1 tablet (500 mg total) by mouth 2 (two) times daily.  . fish oil-omega-3 fatty acids 1000 MG capsule Take 2 g by mouth daily.  Marland Kitchen gabapentin (NEURONTIN) 100 MG capsule TAKE 1 TO 2 CAPSULES BY MOUTH AT BEDTIME  . glipiZIDE (GLUCOTROL XL) 5 MG 24 hr tablet Take 1 tablet (5 mg total) by mouth daily with breakfast.  . lisinopril (ZESTRIL) 10 MG tablet Take 1 tablet (10 mg total) by mouth daily.  . meloxicam (MOBIC) 7.5 MG tablet Take 7.5 mg by mouth daily.  . metFORMIN (GLUCOPHAGE) 500 MG tablet Take 1 tablet (500 mg total) by mouth 2 (two) times daily with a meal.  . omeprazole (PRILOSEC) 40 MG capsule Take 1 capsule (40 mg total) by mouth in the morning and at bedtime.  . Potassium Citrate 15 MEQ (1620 MG) TBCR Take 2 tablets  by mouth 2 (two) times daily.   No facility-administered encounter medications on file as of 11/14/2019.    Past Surgical History:  Procedure Laterality Date  . BACK SURGERY  03/2002   Dr. Trenton Gammon   . CYSTOSCOPY WITH STENT PLACEMENT Left 01/27/2015   Procedure: CYSTOSCOPY, RETROGRADE, WITH LEFT STENT PLACEMENT;  Surgeon: Irine Seal, MD;  Location: [facility];  Service: Urology;  Laterality: Left;  I have 2 cases at [facility] and a foley to place at [facility].  I would guess it will be 830-9 before I get up to [facility].   Marland Kitchen DDD C-Spine Repair    . KNEE ARTHROSCOPY Right     Family History  Problem Relation Age of Onset  . Diabetes Mother   . Hypertension Mother   . Colon cancer Neg Hx   . Esophageal cancer Neg Hx     . Rectal cancer Neg Hx   . Stomach cancer Neg Hx     New complaints: Currently having alotof dental work. Is working on get set of dentures and is having teth pulled.  Social history: Lives by hisself  Controlled substance contract: n/a     Review of Systems  Constitutional: Negative for diaphoresis.  Eyes: Negative for pain.  Respiratory: Negative for shortness of breath.   Cardiovascular: Negative for chest pain, palpitations and leg swelling.  Gastrointestinal: Negative for abdominal pain.  Endocrine: Negative for polydipsia.  Skin: Negative for rash.  Neurological: Negative for dizziness, weakness and headaches.  Hematological: Does not bruise/bleed easily.  All other systems reviewed and are negative.      Objective:   Physical Exam Vitals and nursing note reviewed.  Constitutional:      Appearance: Normal appearance. He is well-developed.  HENT:     Head: Normocephalic.     Nose: Nose normal.  Eyes:     Pupils: Pupils are equal, round, and reactive to light.  Neck:     Thyroid: No thyroid mass or thyromegaly.     Vascular: No carotid bruit or JVD.     Trachea: Phonation normal.  Cardiovascular:     Rate and Rhythm: Normal rate and regular rhythm.  Pulmonary:     Effort: Pulmonary effort is normal. No respiratory distress.     Breath sounds: Normal breath sounds.  Abdominal:     General: Bowel sounds are normal.     Palpations: Abdomen is soft.     Tenderness: There is no abdominal tenderness.  Musculoskeletal:        General: Normal range of motion.     Cervical back: Normal range of motion and neck supple.  Lymphadenopathy:     Cervical: No cervical adenopathy.  Skin:    General: Skin is warm and dry.  Neurological:     Mental Status: He is alert and oriented to person, place, and time.  Psychiatric:        Behavior: Behavior normal.        Thought Content: Thought content normal.        Judgment: Judgment normal.     BP 120/82   Pulse 71    Temp (!) 97.2 F (36.2 C) (Temporal)   Resp 20   Ht 5' 7"  (1.702 m)   Wt 221 lb (100.2 kg)   SpO2 98%   BMI 34.61 kg/m   hgba1c 6.8%     Assessment & Plan:  Dawn Kiper comes in today with chief complaint of Medical Management of Chronic Issues   Diagnosis and orders  addressed:  1. Essential hypertension, benign Low sodium diet - CBC with Differential/Platelet - CMP14+EGFR - lisinopril (ZESTRIL) 10 MG tablet; Take 1 tablet (10 mg total) by mouth daily.  Dispense: 90 tablet; Refill: 1  2. Mixed hyperlipidemia Low fat diet - Lipid panel - atorvastatin (LIPITOR) 10 MG tablet; Take 1 tablet (10 mg total) by mouth daily.  Dispense: 90 tablet; Refill: 1  3. Hypokalemia - Potassium Citrate 15 MEQ (1620 MG) TBCR; Take 2 tablets by mouth 2 (two) times daily.  Dispense: 360 tablet; Refill: 1  4. Type 2 diabetes mellitus with diabetic neuropathy, without long-term current use of insulin (HCC) Continue to watch carb sin diet - Bayer DCA Hb A1c Waived - metFORMIN (GLUCOPHAGE) 500 MG tablet; Take 1 tablet (500 mg total) by mouth 2 (two) times daily with a meal.  Dispense: 180 tablet; Refill: 1 - glipiZIDE (GLUCOTROL XL) 5 MG 24 hr tablet; Take 1 tablet (5 mg total) by mouth daily with breakfast.  Dispense: 90 tablet; Refill: 1 - gabapentin (NEURONTIN) 100 MG capsule; TAKE 1 TO 2 CAPSULES BY MOUTH AT BEDTIME  Dispense: 180 capsule; Refill: 1  5. Chronic back pain, unspecified back location, unspecified back pain laterality Continue back stretches Moist heat Rest   6. BMI 29.0-29.9,adult Discussed diet and exercise for person with BMI >25 Will recheck weight in 3-6 months   Labs pending Health Maintenance reviewed Diet and exercise encouraged  Follow up plan: 3 months   [name], FNP

## 2019-11-15 LAB — CMP14+EGFR
ALT: 16 IU/L (ref 0–44)
AST: 15 IU/L (ref 0–40)
Albumin/Globulin Ratio: 1.3 (ref 1.2–2.2)
Albumin: 4.1 g/dL (ref 3.8–4.8)
Alkaline Phosphatase: 119 IU/L (ref 44–121)
BUN/Creatinine Ratio: 13 (ref 10–24)
BUN: 18 mg/dL (ref 8–27)
Bilirubin Total: 0.3 mg/dL (ref 0.0–1.2)
CO2: 24 mmol/L (ref 20–29)
Calcium: 9.6 mg/dL (ref 8.6–10.2)
Chloride: 104 mmol/L (ref 96–106)
Creatinine, Ser: 1.4 mg/dL — ABNORMAL HIGH (ref 0.76–1.27)
GFR calc Af Amer: 62 mL/min/{1.73_m2} (ref >59)
GFR calc non Af Amer: 54 mL/min/{1.73_m2} — ABNORMAL LOW (ref >59)
Globulin, Total: 3.2 g/dL (ref 1.5–4.5)
Glucose: 140 mg/dL — ABNORMAL HIGH (ref 65–99)
Potassium: 4.3 mmol/L (ref 3.5–5.2)
Sodium: 141 mmol/L (ref 134–144)
Total Protein: 7.3 g/dL (ref 6.0–8.5)

## 2019-11-15 LAB — CBC WITH DIFFERENTIAL/PLATELET
Basophils Absolute: 0.1 10*3/uL (ref 0.0–0.2)
Basos: 1 %
EOS (ABSOLUTE): 0.2 10*3/uL (ref 0.0–0.4)
Eos: 2 %
Hematocrit: 41.4 % (ref 37.5–51.0)
Hemoglobin: 13.8 g/dL (ref 13.0–17.7)
Immature Grans (Abs): 0 10*3/uL (ref 0.0–0.1)
Immature Granulocytes: 0 %
Lymphocytes Absolute: 3.1 10*3/uL (ref 0.7–3.1)
Lymphs: 33 %
MCH: 28.4 pg (ref 26.6–33.0)
MCHC: 33.3 g/dL (ref 31.5–35.7)
MCV: 85 fL (ref 79–97)
Monocytes Absolute: 0.9 10*3/uL (ref 0.1–0.9)
Monocytes: 10 %
Neutrophils Absolute: 5.1 10*3/uL (ref 1.4–7.0)
Neutrophils: 54 %
Platelets: 285 10*3/uL (ref 150–450)
RBC: 4.86 x10E6/uL (ref 4.14–5.80)
RDW: 13.4 % (ref 11.6–15.4)
WBC: 9.4 10*3/uL (ref 3.4–10.8)

## 2019-11-15 LAB — LIPID PANEL
Chol/HDL Ratio: 3.5 ratio (ref 0.0–5.0)
Cholesterol, Total: 114 mg/dL (ref 100–199)
HDL: 33 mg/dL — ABNORMAL LOW (ref >39)
LDL Chol Calc (NIH): 55 mg/dL (ref 0–99)
Triglycerides: 147 mg/dL (ref 0–149)
VLDL Cholesterol Cal: 26 mg/dL (ref 5–40)

## 2019-12-26 ENCOUNTER — Other Ambulatory Visit: Payer: Self-pay | Admitting: Nurse Practitioner

## 2019-12-26 DIAGNOSIS — E782 Mixed hyperlipidemia: Secondary | ICD-10-CM

## 2020-01-11 ENCOUNTER — Other Ambulatory Visit: Payer: Self-pay | Admitting: Nurse Practitioner

## 2020-01-11 DIAGNOSIS — E782 Mixed hyperlipidemia: Secondary | ICD-10-CM

## 2020-01-31 ENCOUNTER — Other Ambulatory Visit: Payer: Self-pay | Admitting: Nurse Practitioner

## 2020-01-31 DIAGNOSIS — E1140 Type 2 diabetes mellitus with diabetic neuropathy, unspecified: Secondary | ICD-10-CM

## 2020-02-18 ENCOUNTER — Ambulatory Visit: Payer: Self-pay | Admitting: Nurse Practitioner

## 2020-03-06 ENCOUNTER — Other Ambulatory Visit: Payer: Self-pay

## 2020-03-06 ENCOUNTER — Encounter: Payer: Self-pay | Admitting: Nurse Practitioner

## 2020-03-06 ENCOUNTER — Ambulatory Visit: Payer: BC Managed Care – PPO | Admitting: Nurse Practitioner

## 2020-03-06 VITALS — BP 121/82 | HR 69 | Temp 98.1°F | Resp 20 | Ht 67.0 in | Wt 212.0 lb

## 2020-03-06 DIAGNOSIS — E782 Mixed hyperlipidemia: Secondary | ICD-10-CM | POA: Diagnosis not present

## 2020-03-06 DIAGNOSIS — I10 Essential (primary) hypertension: Secondary | ICD-10-CM | POA: Diagnosis not present

## 2020-03-06 DIAGNOSIS — M549 Dorsalgia, unspecified: Secondary | ICD-10-CM

## 2020-03-06 DIAGNOSIS — E876 Hypokalemia: Secondary | ICD-10-CM

## 2020-03-06 DIAGNOSIS — Z6833 Body mass index (BMI) 33.0-33.9, adult: Secondary | ICD-10-CM

## 2020-03-06 DIAGNOSIS — E1140 Type 2 diabetes mellitus with diabetic neuropathy, unspecified: Secondary | ICD-10-CM | POA: Diagnosis not present

## 2020-03-06 DIAGNOSIS — G8929 Other chronic pain: Secondary | ICD-10-CM

## 2020-03-06 LAB — BAYER DCA HB A1C WAIVED: HB A1C (BAYER DCA - WAIVED): 6.2 % (ref <7.0)

## 2020-03-06 MED ORDER — ATORVASTATIN CALCIUM 10 MG PO TABS: 10.0000 mg | ORAL_TABLET | Freq: Every day | ORAL | 1 refills | Status: DC

## 2020-03-06 MED ORDER — GABAPENTIN 100 MG PO CAPS: ORAL_CAPSULE | ORAL | 1 refills | Status: DC

## 2020-03-06 MED ORDER — LISINOPRIL 10 MG PO TABS: 10.0000 mg | ORAL_TABLET | Freq: Every day | ORAL | 1 refills | Status: DC

## 2020-03-06 MED ORDER — METFORMIN HCL 500 MG PO TABS
500.0000 mg | ORAL_TABLET | Freq: Two times a day (BID) | ORAL | 1 refills | Status: DC
Start: 2020-03-06 — End: 2020-06-12

## 2020-03-06 MED ORDER — GLIPIZIDE ER 5 MG PO TB24: 5.0000 mg | ORAL_TABLET | Freq: Every day | ORAL | 1 refills | Status: DC

## 2020-03-06 MED ORDER — POTASSIUM CITRATE ER 15 MEQ (1620 MG) PO TBCR: 2.0000 | EXTENDED_RELEASE_TABLET | Freq: Two times a day (BID) | ORAL | 1 refills | Status: DC

## 2020-03-06 NOTE — Progress Notes (Signed)
Subjective:    Patient ID: Cameron Noble, male    DOB: 08/14/1960, 60 y.o.   MRN: [id number]   Chief Complaint: medical management of chronic issues     HPI:  1. Essential hypertension, benign No c/o chest pain, sob or headache. Dos not check blood pressure at home. BP Readings from Last 3 Encounters:  11/14/19 120/82  08/16/19 126/85  07/12/19 136/89     2. Mixed hyperlipidemia Dos not watch diet. Eats whatever. Does not do much cooking hisself. Lab Results  Component Value Date   CHOL 114 11/14/2019   HDL 33 (L) 11/14/2019   LDLCALC 55 11/14/2019   TRIG 147 11/14/2019   CHOLHDL 3.5 11/14/2019      3. Type 2 diabetes mellitus with diabetic neuropathy, without long-term current use of insulin (HCC) Fasting blood sugars are running aroun 120-130. He doe snot check it very often. Denies feeling of low blood sugars. Lab Results  Component Value Date   HGBA1C 6.8 11/14/2019     4. Hypokalemia No c/o lower ext cramping. Lab Results  Component Value Date   K 4.3 11/14/2019     5. Chronic back pain, unspecified back location, unspecified back pain laterality Tolerable. Really no change. Rate [place] currently 7/10. Just uses OTC meds which help.  6. BMI 33.0-33.9,adult Weight is down 9lbs  Wt Readings from Last 3 Encounters:  03/06/20 212 lb (96.2 kg)  11/14/19 221 lb (100.2 kg)  08/16/19 218 lb (98.9 kg)   BMI Readings from Last 3 Encounters:  03/06/20 33.20 kg/m  11/14/19 34.61 kg/m  08/16/19 34.14 kg/m     Outpatient Encounter Medications as of 03/06/2020  Medication Sig  . ammonium lactate (AMLACTIN) 12 % cream APPLY TO DRY SKIN ON BOTTOM OF FEET 2 TIMES A DAY  . aspirin 81 MG tablet Take 81 mg by mouth daily.  Marland Kitchen atorvastatin (LIPITOR) 10 MG tablet TAKE 1 TABLET BY MOUTH  DAILY  . azelastine (OPTIVAR) 0.05 % ophthalmic solution SMARTSIG:1 Drop(s) In Eye(s) Every 12 Hours  . Cholecalciferol (VITAMIN D-3 PO) Take 1 tablet by mouth daily.  .  ciclopirox (PENLAC) 8 % solution Apply topically at bedtime. Apply over nail and surrounding skin. Apply daily over previous coat. After seven (7) days, may remove with alcohol and continue cycle.  . fish oil-omega-3 fatty acids 1000 MG capsule Take 2 g by mouth daily.  Marland Kitchen gabapentin (NEURONTIN) 100 MG capsule TAKE 1 TO 2 CAPSULES BY MOUTH AT BEDTIME  . glipiZIDE (GLUCOTROL XL) 5 MG 24 hr tablet TAKE 1 TABLET BY MOUTH  DAILY WITH BREAKFAST  . lisinopril (ZESTRIL) 10 MG tablet Take 1 tablet (10 mg total) by mouth daily.  . meloxicam (MOBIC) 7.5 MG tablet Take 7.5 mg by mouth daily.  . metFORMIN (GLUCOPHAGE) 500 MG tablet Take 1 tablet (500 mg total) by mouth 2 (two) times daily with a meal.  . omeprazole (PRILOSEC) 40 MG capsule Take 1 capsule (40 mg total) by mouth in the morning and at bedtime.  . Potassium Citrate 15 MEQ (1620 MG) TBCR Take 2 tablets by mouth 2 (two) times daily.     Past Surgical History:  Procedure Laterality Date  . BACK SURGERY  03/2002   Dr. Trenton Gammon   . CYSTOSCOPY WITH STENT PLACEMENT Left 01/27/2015   Procedure: CYSTOSCOPY, RETROGRADE, WITH LEFT STENT PLACEMENT;  Surgeon: Irine Seal, MD;  Location: [facility];  Service: Urology;  Laterality: Left;  I have 2 cases at [facility] and a foley to  place at [facility].  I would guess it will be 830-9 before I get up to [facility].   Marland Kitchen DDD C-Spine Repair    . KNEE ARTHROSCOPY Right     Family History  Problem Relation Age of Onset  . Diabetes Mother   . Hypertension Mother   . Colon cancer Neg Hx   . Esophageal cancer Neg Hx   . Rectal cancer Neg Hx   . Stomach cancer Neg Hx     New complaints: None today  Social history: Lives with wife.   Controlled substance contract: n/a    Review of Systems  Constitutional: Negative for diaphoresis.  Eyes: Negative for pain.  Respiratory: Negative for shortness of breath.   Cardiovascular: Negative for chest pain, palpitations and leg swelling.  Gastrointestinal: Negative for abdominal pain.   Endocrine: Negative for polydipsia.  Skin: Negative for rash.  Neurological: Negative for dizziness, weakness and headaches.  Hematological: Does not bruise/bleed easily.  All other systems reviewed and are negative.      Objective:   Physical Exam Vitals and nursing note reviewed.  Constitutional:      Appearance: Normal appearance. He is well-developed and well-nourished.  HENT:     Head: Normocephalic.     Nose: Nose normal.     Mouth/Throat:     Mouth: Oropharynx is clear and moist.  Eyes:     Extraocular Movements: EOM normal.     Pupils: Pupils are equal, round, and reactive to light.  Neck:     Thyroid: No thyroid mass or thyromegaly.     Vascular: No carotid bruit or JVD.     Trachea: Phonation normal.  Cardiovascular:     Rate and Rhythm: Normal rate and regular rhythm.  Pulmonary:     Effort: Pulmonary effort is normal. No respiratory distress.     Breath sounds: Normal breath sounds.  Abdominal:     General: Bowel sounds are normal. Aorta is normal.     Palpations: Abdomen is soft.     Tenderness: There is no abdominal tenderness.  Musculoskeletal:        General: Normal range of motion.     Cervical back: Normal range of motion and neck supple.  Lymphadenopathy:     Cervical: No cervical adenopathy.  Skin:    General: Skin is warm and dry.  Neurological:     Mental Status: He is alert and oriented to person, place, and time.  Psychiatric:        Mood and Affect: Mood and affect normal.        Behavior: Behavior normal.        Thought Content: Thought content normal.        Judgment: Judgment normal.    BP 121/82   Pulse 69   Temp 98.1 F (36.7 C) (Temporal)   Resp 20   Ht $R'5\' 7"'YI$  (1.702 m)   Wt 212 lb (96.2 kg)   SpO2 99%   BMI 33.20 kg/m         Assessment & Plan:  Cameron Noble comes in today with chief complaint of No chief complaint on file.   Diagnosis and orders addressed:  1. Essential hypertension, benign Low sodium diet -  CBC with Differential/Platelet - CMP14+EGFR - lisinopril (ZESTRIL) 10 MG tablet; Take 1 tablet (10 mg total) by mouth daily.  Dispense: 90 tablet; Refill: 1  2. Mixed hyperlipidemia Low fat diet - Lipid panel - atorvastatin (LIPITOR) 10 MG tablet; Take 1 tablet (10 mg  total) by mouth daily.  Dispense: 90 tablet; Refill: 1  3. Type 2 diabetes mellitus with diabetic neuropathy, without long-term current use of insulin (HCC) contniue to watch carbs in diet - Bayer DCA Hb A1c Waived - Microalbumin / creatinine urine ratio - glipiZIDE (GLUCOTROL XL) 5 MG 24 hr tablet; Take 1 tablet (5 mg total) by mouth daily with breakfast.  Dispense: 90 tablet; Refill: 1 - metFORMIN (GLUCOPHAGE) 500 MG tablet; Take 1 tablet (500 mg total) by mouth 2 (two) times daily with a meal.  Dispense: 180 tablet; Refill: 1  4. Hypokalemia Labs pending - Potassium Citrate 15 MEQ (1620 MG) TBCR; Take 2 tablets by mouth 2 (two) times daily.  Dispense: 360 tablet; Refill: 1  5. Chronic back pain, unspecified back location, unspecified back pain laterality Moist heat rest - gabapentin (NEURONTIN) 100 MG capsule; TAKE 1 TO 2 CAPSULES BY MOUTH AT BEDTIME  Dispense: 180 capsule; Refill: 1  6. BMI 33.0-33.9,adult Discussed diet and exercise for person with BMI >25 Will recheck weight in 3-6 months    Labs pending Health Maintenance reviewed Diet and exercise encouraged  Follow up plan: 3 months   [name], FNP

## 2020-03-06 NOTE — Patient Instructions (Signed)
Diabetes Mellitus and Foot Care Foot care is an important part of your health, especially when you have diabetes. Diabetes may cause you to have problems because of poor blood flow (circulation) to your feet and legs, which can cause your skin to:  Become thinner and drier.  Break more easily.  Heal more slowly.  Peel and crack. You may also have nerve damage (neuropathy) in your legs and feet, causing decreased feeling in them. This means that you may not notice minor injuries to your feet that could lead to more serious problems. Noticing and addressing any potential problems early is the best way to prevent future foot problems. How to care for your feet Foot hygiene  Wash your feet daily with warm water and mild soap. Do not use hot water. Then, pat your feet and the areas between your toes until they are completely dry. Do not soak your feet as this can dry your skin.  Trim your toenails straight across. Do not dig under them or around the cuticle. File the edges of your nails with an emery board or nail file.  Apply a moisturizing lotion or petroleum jelly to the skin on your feet and to dry, brittle toenails. Use lotion that does not contain alcohol and is unscented. Do not apply lotion between your toes.   Shoes and socks  Wear clean socks or stockings every day. Make sure they are not too tight. Do not wear knee-high stockings since they may decrease blood flow to your legs.  Wear shoes that fit properly and have enough cushioning. Always look in your shoes before you put them on to be sure there are no objects inside.  To break in new shoes, wear them for just a few hours a day. This prevents injuries on your feet. Wounds, scrapes, corns, and calluses  Check your feet daily for blisters, cuts, bruises, sores, and redness. If you cannot see the bottom of your feet, use a mirror or ask someone for help.  Do not cut corns or calluses or try to remove them with medicine.  If you  find a minor scrape, cut, or break in the skin on your feet, keep it and the skin around it clean and dry. You may clean these areas with mild soap and water. Do not clean the area with peroxide, alcohol, or iodine.  If you have a wound, scrape, corn, or callus on your foot, look at it several times a day to make sure it is healing and not infected. Check for: ? Redness, swelling, or pain. ? Fluid or blood. ? Warmth. ? Pus or a bad smell.   General tips  Do not cross your legs. This may decrease blood flow to your feet.  Do not use heating pads or hot water bottles on your feet. They may burn your skin. If you have lost feeling in your feet or legs, you may not know this is happening until it is too late.  Protect your feet from hot and cold by wearing shoes, such as at the beach or on hot pavement.  Schedule a complete foot exam at least once a year (annually) or more often if you have foot problems. Report any cuts, sores, or bruises to your health care provider immediately. Where to find more information  American Diabetes Association: www.diabetes.org  Association of Diabetes Care & Education Specialists: www.diabeteseducator.org Contact a health care provider if:  You have a medical condition that increases your risk of infection and   you have any cuts, sores, or bruises on your feet.  You have an injury that is not healing.  You have redness on your legs or feet.  You feel burning or tingling in your legs or feet.  You have pain or cramps in your legs and feet.  Your legs or feet are numb.  Your feet always feel cold.  You have pain around any toenails. Get help right away if:  You have a wound, scrape, corn, or callus on your foot and: ? You have pain, swelling, or redness that gets worse. ? You have fluid or blood coming from the wound, scrape, corn, or callus. ? Your wound, scrape, corn, or callus feels warm to the touch. ? You have pus or a bad smell coming from  the wound, scrape, corn, or callus. ? You have a fever. ? You have a red line going up your leg. Summary  Check your feet every day for blisters, cuts, bruises, sores, and redness.  Apply a moisturizing lotion or petroleum jelly to the skin on your feet and to dry, brittle toenails.  Wear shoes that fit properly and have enough cushioning.  If you have foot problems, report any cuts, sores, or bruises to your health care provider immediately.  Schedule a complete foot exam at least once a year (annually) or more often if you have foot problems. This information is not intended to replace advice given to you by your health care provider. Make sure you discuss any questions you have with your health care provider. Document Revised: 07/19/2019 Document Reviewed: 07/19/2019 Elsevier Patient Education  2021 Elsevier Inc.  

## 2020-03-07 LAB — CBC WITH DIFFERENTIAL/PLATELET
Basophils Absolute: 0.1 10*3/uL (ref 0.0–0.2)
Basos: 1 %
EOS (ABSOLUTE): 0.2 10*3/uL (ref 0.0–0.4)
Eos: 2 %
Hematocrit: 43.8 % (ref 37.5–51.0)
Hemoglobin: 14.4 g/dL (ref 13.0–17.7)
Immature Grans (Abs): 0 10*3/uL (ref 0.0–0.1)
Immature Granulocytes: 0 %
Lymphocytes Absolute: 2.6 10*3/uL (ref 0.7–3.1)
Lymphs: 25 %
MCH: 27.9 pg (ref 26.6–33.0)
MCHC: 32.9 g/dL (ref 31.5–35.7)
MCV: 85 fL (ref 79–97)
Monocytes Absolute: 0.8 10*3/uL (ref 0.1–0.9)
Monocytes: 8 %
Neutrophils Absolute: 6.6 10*3/uL (ref 1.4–7.0)
Neutrophils: 64 %
Platelets: 284 10*3/uL (ref 150–450)
RBC: 5.17 x10E6/uL (ref 4.14–5.80)
RDW: 13.8 % (ref 11.6–15.4)
WBC: 10.3 10*3/uL (ref 3.4–10.8)

## 2020-03-07 LAB — CMP14+EGFR
ALT: 18 IU/L (ref 0–44)
AST: 18 IU/L (ref 0–40)
Albumin/Globulin Ratio: 1.3 (ref 1.2–2.2)
Albumin: 4.2 g/dL (ref 3.8–4.8)
Alkaline Phosphatase: 115 IU/L (ref 44–121)
BUN/Creatinine Ratio: 9 — ABNORMAL LOW (ref 10–24)
BUN: 12 mg/dL (ref 8–27)
Bilirubin Total: 0.3 mg/dL (ref 0.0–1.2)
CO2: 25 mmol/L (ref 20–29)
Calcium: 9.9 mg/dL (ref 8.6–10.2)
Chloride: 103 mmol/L (ref 96–106)
Creatinine, Ser: 1.41 mg/dL — ABNORMAL HIGH (ref 0.76–1.27)
GFR calc Af Amer: 62 mL/min/{1.73_m2} (ref >59)
GFR calc non Af Amer: 53 mL/min/{1.73_m2} — ABNORMAL LOW (ref >59)
Globulin, Total: 3.3 g/dL (ref 1.5–4.5)
Glucose: 120 mg/dL — ABNORMAL HIGH (ref 65–99)
Potassium: 5 mmol/L (ref 3.5–5.2)
Sodium: 141 mmol/L (ref 134–144)
Total Protein: 7.5 g/dL (ref 6.0–8.5)

## 2020-03-07 LAB — LIPID PANEL
Chol/HDL Ratio: 3.2 ratio (ref 0.0–5.0)
Cholesterol, Total: 108 mg/dL (ref 100–199)
HDL: 34 mg/dL — ABNORMAL LOW (ref >39)
LDL Chol Calc (NIH): 53 mg/dL (ref 0–99)
Triglycerides: 112 mg/dL (ref 0–149)
VLDL Cholesterol Cal: 21 mg/dL (ref 5–40)

## 2020-06-12 ENCOUNTER — Ambulatory Visit (INDEPENDENT_AMBULATORY_CARE_PROVIDER_SITE_OTHER): Payer: BC Managed Care – PPO | Admitting: Nurse Practitioner

## 2020-06-12 ENCOUNTER — Telehealth: Payer: Self-pay | Admitting: Nurse Practitioner

## 2020-06-12 ENCOUNTER — Encounter: Payer: Self-pay | Admitting: Nurse Practitioner

## 2020-06-12 ENCOUNTER — Other Ambulatory Visit: Payer: Self-pay

## 2020-06-12 VITALS — BP 136/89 | HR 72 | Temp 98.4°F | Resp 20 | Ht 67.0 in | Wt 218.0 lb

## 2020-06-12 DIAGNOSIS — R8290 Unspecified abnormal findings in urine: Secondary | ICD-10-CM | POA: Diagnosis not present

## 2020-06-12 DIAGNOSIS — Z6829 Body mass index (BMI) 29.0-29.9, adult: Secondary | ICD-10-CM

## 2020-06-12 DIAGNOSIS — E782 Mixed hyperlipidemia: Secondary | ICD-10-CM

## 2020-06-12 DIAGNOSIS — E876 Hypokalemia: Secondary | ICD-10-CM

## 2020-06-12 DIAGNOSIS — Z125 Encounter for screening for malignant neoplasm of prostate: Secondary | ICD-10-CM

## 2020-06-12 DIAGNOSIS — M549 Dorsalgia, unspecified: Secondary | ICD-10-CM

## 2020-06-12 DIAGNOSIS — I10 Essential (primary) hypertension: Secondary | ICD-10-CM | POA: Diagnosis not present

## 2020-06-12 DIAGNOSIS — E1140 Type 2 diabetes mellitus with diabetic neuropathy, unspecified: Secondary | ICD-10-CM

## 2020-06-12 DIAGNOSIS — G8929 Other chronic pain: Secondary | ICD-10-CM

## 2020-06-12 LAB — URINALYSIS, COMPLETE
Bilirubin, UA: NEGATIVE
Ketones, UA: NEGATIVE
Nitrite, UA: POSITIVE — AB
Protein,UA: NEGATIVE
Specific Gravity, UA: 1.015 (ref 1.005–1.030)
Urobilinogen, Ur: 4 mg/dL — ABNORMAL HIGH (ref 0.2–1.0)
pH, UA: 7.5 (ref 5.0–7.5)

## 2020-06-12 LAB — BAYER DCA HB A1C WAIVED: HB A1C (BAYER DCA - WAIVED): 6.9 % (ref <7.0)

## 2020-06-12 LAB — MICROSCOPIC EXAMINATION: Epithelial Cells (non renal): NONE SEEN /hpf (ref 0–10)

## 2020-06-12 MED ORDER — GLIPIZIDE ER 5 MG PO TB24: 5.0000 mg | ORAL_TABLET | Freq: Every day | ORAL | 1 refills | Status: DC

## 2020-06-12 MED ORDER — CEPHALEXIN 500 MG PO CAPS: 500.0000 mg | ORAL_CAPSULE | Freq: Two times a day (BID) | ORAL | 0 refills | Status: DC

## 2020-06-12 MED ORDER — LISINOPRIL 10 MG PO TABS: 10.0000 mg | ORAL_TABLET | Freq: Every day | ORAL | 1 refills | Status: DC

## 2020-06-12 MED ORDER — GABAPENTIN 100 MG PO CAPS: ORAL_CAPSULE | ORAL | 1 refills | Status: DC

## 2020-06-12 MED ORDER — POTASSIUM CITRATE ER 15 MEQ (1620 MG) PO TBCR: 2.0000 | EXTENDED_RELEASE_TABLET | Freq: Two times a day (BID) | ORAL | 1 refills | Status: DC

## 2020-06-12 MED ORDER — METFORMIN HCL 500 MG PO TABS: 500.0000 mg | ORAL_TABLET | Freq: Two times a day (BID) | ORAL | 1 refills | Status: DC

## 2020-06-12 MED ORDER — ATORVASTATIN CALCIUM 10 MG PO TABS: 10.0000 mg | ORAL_TABLET | Freq: Every day | ORAL | 1 refills | Status: DC

## 2020-06-12 MED ORDER — OMEPRAZOLE 40 MG PO CPDR: 40.0000 mg | DELAYED_RELEASE_CAPSULE | Freq: Two times a day (BID) | ORAL | 0 refills | Status: DC

## 2020-06-12 NOTE — Addendum Note (Signed)
Addended by: Rolena Infante on: 06/12/2020 02:42 PM   Modules accepted: Orders

## 2020-06-12 NOTE — Addendum Note (Signed)
Addended by: Rolena Infante on: 06/12/2020 12:49 PM   Modules accepted: Orders

## 2020-06-12 NOTE — Telephone Encounter (Signed)
Checked urine today for UTI and was positive. antibiobiotic prescription was sent to pharmacy .

## 2020-06-12 NOTE — Progress Notes (Signed)
Subjective:    Patient ID: Cameron Noble, male    DOB: [birth year], 60 y.o.   MRN: [id number]   Chief Complaint: medical management of chronic issues     HPI:  1. Mixed hyperlipidemia Does not really watch diet. Does no dedicated exercise. Lab Results  Component Value Date   CHOL 108 03/06/2020   HDL 34 (L) 03/06/2020   LDLCALC 53 03/06/2020   TRIG 112 03/06/2020   CHOLHDL 3.2 03/06/2020     2. Type 2 diabetes mellitus with diabetic neuropathy, without long-term current use of insulin (HCC) Does not check blood sugars very often at home. does  3. Essential hypertension, benign No c/o chect pain, sob or headache. Does not check blood pressure at home. He does try to watch his diet. BP Readings from Last 3 Encounters:  03/06/20 121/82  11/14/19 120/82  08/16/19 126/85     4. Hypokalemia No c/o lower ext cramping Lab Results  Component Value Date   K 5.0 03/06/2020     5. Chronic back pain, unspecified back location, unspecified back pain laterality His chart list Hydrocodone, but he says he is not taking that. He just takes motrin to tylrnol when needed. Back pain is usually tolerable and does not limit his activity.  6. BMI 29.0-29.9,adult Weight is up 6 lbs Wt Readings from Last 3 Encounters:  06/12/20 218 lb (98.9 kg)  03/06/20 212 lb (96.2 kg)  11/14/19 221 lb (100.2 kg)   BMI Readings from Last 3 Encounters:  06/12/20 34.14 kg/m  03/06/20 33.20 kg/m  11/14/19 34.61 kg/m     Outpatient Encounter Medications as of 06/12/2020  Medication Sig  . ammonium lactate (AMLACTIN) 12 % cream APPLY TO DRY SKIN ON BOTTOM OF FEET 2 TIMES A DAY  . aspirin 81 MG tablet Take 81 mg by mouth daily.  Marland Kitchen atorvastatin (LIPITOR) 10 MG tablet Take 1 tablet (10 mg total) by mouth daily.  Marland Kitchen azelastine (OPTIVAR) 0.05 % ophthalmic solution SMARTSIG:1 Drop(s) In Eye(s) Every 12 Hours  . Cholecalciferol (VITAMIN D-3 PO) Take 1 tablet by mouth daily.  . ciclopirox (PENLAC) 8 %  solution Apply topically at bedtime. Apply over nail and surrounding skin. Apply daily over previous coat. After seven (7) days, may remove with alcohol and continue cycle.  . fish oil-omega-3 fatty acids 1000 MG capsule Take 2 g by mouth daily.  Marland Kitchen gabapentin (NEURONTIN) 100 MG capsule TAKE 1 TO 2 CAPSULES BY MOUTH AT BEDTIME  . glipiZIDE (GLUCOTROL XL) 5 MG 24 hr tablet Take 1 tablet (5 mg total) by mouth daily with breakfast.  . HYDROcodone-acetaminophen (NORCO/VICODIN) 5-325 MG tablet Take 1 tablet by mouth every 6 (six) hours as needed. for pain  . lisinopril (ZESTRIL) 10 MG tablet Take 1 tablet (10 mg total) by mouth daily.  . meloxicam (MOBIC) 7.5 MG tablet Take 7.5 mg by mouth daily.  . metFORMIN (GLUCOPHAGE) 500 MG tablet Take 1 tablet (500 mg total) by mouth 2 (two) times daily with a meal.  . omeprazole (PRILOSEC) 40 MG capsule Take 1 capsule (40 mg total) by mouth in the morning and at bedtime.  . Potassium Citrate 15 MEQ (1620 MG) TBCR Take 2 tablets by mouth 2 (two) times daily.   No facility-administered encounter medications on file as of 06/12/2020.    Past Surgical History:  Procedure Laterality Date  . BACK SURGERY  03/2002   Dr. Trenton Gammon   . CYSTOSCOPY WITH STENT PLACEMENT Left 01/27/2015   Procedure: CYSTOSCOPY, RETROGRADE,  WITH LEFT STENT PLACEMENT;  Surgeon: Irine Seal, MD;  Location: [facility];  Service: Urology;  Laterality: Left;  I have 2 cases at [facility] and a foley to place at [facility].  I would guess it will be 830-9 before I get up to [facility].   Marland Kitchen DDD C-Spine Repair    . KNEE ARTHROSCOPY Right     Family History  Problem Relation Age of Onset  . Diabetes Mother   . Hypertension Mother   . Colon cancer Neg Hx   . Esophageal cancer Neg Hx   . Rectal cancer Neg Hx   . Stomach cancer Neg Hx     New complaints: None today  Social history: He lives with his wife.  Controlled substance contract: n/a    Review of Systems  Constitutional: Negative for diaphoresis.  Eyes:  Negative for pain.  Respiratory: Negative for shortness of breath.   Cardiovascular: Negative for chest pain, palpitations and leg swelling.  Gastrointestinal: Negative for abdominal pain.  Endocrine: Negative for polydipsia.  Skin: Negative for rash.  Neurological: Negative for dizziness, weakness and headaches.  Hematological: Does not bruise/bleed easily.  All other systems reviewed and are negative.      Objective:   Physical Exam Vitals and nursing note reviewed.  Constitutional:      Appearance: Normal appearance. He is well-developed.  HENT:     Head: Normocephalic.     Nose: Nose normal.  Eyes:     Pupils: Pupils are equal, round, and reactive to light.  Neck:     Thyroid: No thyroid mass or thyromegaly.     Vascular: No carotid bruit or JVD.     Trachea: Phonation normal.  Cardiovascular:     Rate and Rhythm: Normal rate and regular rhythm.  Pulmonary:     Effort: Pulmonary effort is normal. No respiratory distress.     Breath sounds: Normal breath sounds.  Abdominal:     General: Bowel sounds are normal.     Palpations: Abdomen is soft.     Tenderness: There is no abdominal tenderness.  Musculoskeletal:        General: Normal range of motion.     Cervical back: Normal range of motion and neck supple.  Lymphadenopathy:     Cervical: No cervical adenopathy.  Skin:    General: Skin is warm and dry.  Neurological:     Mental Status: He is alert and oriented to person, place, and time.  Psychiatric:        Behavior: Behavior normal.        Thought Content: Thought content normal.        Judgment: Judgment normal.     BP 136/89   Pulse 72   Temp 98.4 F (36.9 C) (Temporal)   Resp 20   Ht 5' 7"  (1.702 m)   Wt 218 lb (98.9 kg)   SpO2 99%   BMI 34.14 kg/m   HGBA1c 6.9%      Assessment & Plan:  Cameron Noble comes in today with chief complaint of Medical Management of Chronic Issues   Diagnosis and orders addressed:  1. Mixed hyperlipidemia Low  sodium diet - Lipid panel - atorvastatin (LIPITOR) 10 MG tablet; Take 1 tablet (10 mg total) by mouth daily.  Dispense: 90 tablet; Refill: 1  2. Type 2 diabetes mellitus with diabetic neuropathy, without long-term current use of insulin (HCC) Continue to watch carbs in diet - Bayer DCA Hb A1c Waived - Microalbumin / creatinine urine ratio -  metFORMIN (GLUCOPHAGE) 500 MG tablet; Take 1 tablet (500 mg total) by mouth 2 (two) times daily with a meal.  Dispense: 180 tablet; Refill: 1 - glipiZIDE (GLUCOTROL XL) 5 MG 24 hr tablet; Take 1 tablet (5 mg total) by mouth daily with breakfast.  Dispense: 90 tablet; Refill: 1  3. Essential hypertension, benign Low sodium diet - CBC with Differential/Platelet - CMP14+EGFR - lisinopril (ZESTRIL) 10 MG tablet; Take 1 tablet (10 mg total) by mouth daily.  Dispense: 90 tablet; Refill: 1  4. Hypokalemia Labs pending - Potassium Citrate 15 MEQ (1620 MG) TBCR; Take 2 tablets by mouth 2 (two) times daily.  Dispense: 360 tablet; Refill: 1  5. Chronic back pain, unspecified back location, unspecified back pain laterality Moist heat rest - gabapentin (NEURONTIN) 100 MG capsule; TAKE 1 TO 2 CAPSULES BY MOUTH AT BEDTIME  Dispense: 180 capsule; Refill: 1  6. BMI 29.0-29.9,adult Discussed diet and exercise for person with BMI >25 Will recheck weight in 3-6 months    Labs pending Health Maintenance reviewed Diet and exercise encouraged  Follow up plan: 3 months   [name], FNP

## 2020-06-12 NOTE — Patient Instructions (Signed)
Diabetes Mellitus and Foot Care Foot care is an important part of your health, especially when you have diabetes. Diabetes may cause you to have problems because of poor blood flow (circulation) to your feet and legs, which can cause your skin to:  Become thinner and drier.  Break more easily.  Heal more slowly.  Peel and crack. You may also have nerve damage (neuropathy) in your legs and feet, causing decreased feeling in them. This means that you may not notice minor injuries to your feet that could lead to more serious problems. Noticing and addressing any potential problems early is the best way to prevent future foot problems. How to care for your feet Foot hygiene  Wash your feet daily with warm water and mild soap. Do not use hot water. Then, pat your feet and the areas between your toes until they are completely dry. Do not soak your feet as this can dry your skin.  Trim your toenails straight across. Do not dig under them or around the cuticle. File the edges of your nails with an emery board or nail file.  Apply a moisturizing lotion or petroleum jelly to the skin on your feet and to dry, brittle toenails. Use lotion that does not contain alcohol and is unscented. Do not apply lotion between your toes.   Shoes and socks  Wear clean socks or stockings every day. Make sure they are not too tight. Do not wear knee-high stockings since they may decrease blood flow to your legs.  Wear shoes that fit properly and have enough cushioning. Always look in your shoes before you put them on to be sure there are no objects inside.  To break in new shoes, wear them for just a few hours a day. This prevents injuries on your feet. Wounds, scrapes, corns, and calluses  Check your feet daily for blisters, cuts, bruises, sores, and redness. If you cannot see the bottom of your feet, use a mirror or ask someone for help.  Do not cut corns or calluses or try to remove them with medicine.  If you  find a minor scrape, cut, or break in the skin on your feet, keep it and the skin around it clean and dry. You may clean these areas with mild soap and water. Do not clean the area with peroxide, alcohol, or iodine.  If you have a wound, scrape, corn, or callus on your foot, look at it several times a day to make sure it is healing and not infected. Check for: ? Redness, swelling, or pain. ? Fluid or blood. ? Warmth. ? Pus or a bad smell.   General tips  Do not cross your legs. This may decrease blood flow to your feet.  Do not use heating pads or hot water bottles on your feet. They may burn your skin. If you have lost feeling in your feet or legs, you may not know this is happening until it is too late.  Protect your feet from hot and cold by wearing shoes, such as at the beach or on hot pavement.  Schedule a complete foot exam at least once a year (annually) or more often if you have foot problems. Report any cuts, sores, or bruises to your health care provider immediately. Where to find more information  American Diabetes Association: www.diabetes.org  Association of Diabetes Care & Education Specialists: www.diabeteseducator.org Contact a health care provider if:  You have a medical condition that increases your risk of infection and   you have any cuts, sores, or bruises on your feet.  You have an injury that is not healing.  You have redness on your legs or feet.  You feel burning or tingling in your legs or feet.  You have pain or cramps in your legs and feet.  Your legs or feet are numb.  Your feet always feel cold.  You have pain around any toenails. Get help right away if:  You have a wound, scrape, corn, or callus on your foot and: ? You have pain, swelling, or redness that gets worse. ? You have fluid or blood coming from the wound, scrape, corn, or callus. ? Your wound, scrape, corn, or callus feels warm to the touch. ? You have pus or a bad smell coming from  the wound, scrape, corn, or callus. ? You have a fever. ? You have a red line going up your leg. Summary  Check your feet every day for blisters, cuts, bruises, sores, and redness.  Apply a moisturizing lotion or petroleum jelly to the skin on your feet and to dry, brittle toenails.  Wear shoes that fit properly and have enough cushioning.  If you have foot problems, report any cuts, sores, or bruises to your health care provider immediately.  Schedule a complete foot exam at least once a year (annually) or more often if you have foot problems. This information is not intended to replace advice given to you by your health care provider. Make sure you discuss any questions you have with your health care provider. Document Revised: 07/19/2019 Document Reviewed: 07/19/2019 Elsevier Patient Education  2021 Elsevier Inc.  

## 2020-06-13 LAB — CMP14+EGFR
ALT: 18 IU/L (ref 0–44)
AST: 15 IU/L (ref 0–40)
Albumin/Globulin Ratio: 1.3 (ref 1.2–2.2)
Albumin: 4.2 g/dL (ref 3.8–4.8)
Alkaline Phosphatase: 139 IU/L — ABNORMAL HIGH (ref 44–121)
BUN/Creatinine Ratio: 11 (ref 10–24)
BUN: 15 mg/dL (ref 8–27)
Bilirubin Total: 0.5 mg/dL (ref 0.0–1.2)
CO2: 23 mmol/L (ref 20–29)
Calcium: 9.6 mg/dL (ref 8.6–10.2)
Chloride: 101 mmol/L (ref 96–106)
Creatinine, Ser: 1.37 mg/dL — ABNORMAL HIGH (ref 0.76–1.27)
Globulin, Total: 3.3 g/dL (ref 1.5–4.5)
Glucose: 262 mg/dL — ABNORMAL HIGH (ref 65–99)
Potassium: 4.4 mmol/L (ref 3.5–5.2)
Sodium: 140 mmol/L (ref 134–144)
Total Protein: 7.5 g/dL (ref 6.0–8.5)
eGFR: 58 mL/min/{1.73_m2} — ABNORMAL LOW (ref >59)

## 2020-06-13 LAB — MICROALBUMIN / CREATININE URINE RATIO
Creatinine, Urine: 96.9 mg/dL
Microalb/Creat Ratio: 22 mg/g creat (ref 0–29)
Microalbumin, Urine: 21.6 ug/mL

## 2020-06-13 LAB — CBC WITH DIFFERENTIAL/PLATELET
Basophils Absolute: 0.1 10*3/uL (ref 0.0–0.2)
Basos: 0 %
EOS (ABSOLUTE): 0.1 10*3/uL (ref 0.0–0.4)
Eos: 1 %
Hematocrit: 44.4 % (ref 37.5–51.0)
Hemoglobin: 14.3 g/dL (ref 13.0–17.7)
Immature Grans (Abs): 0 10*3/uL (ref 0.0–0.1)
Immature Granulocytes: 0 %
Lymphocytes Absolute: 2.2 10*3/uL (ref 0.7–3.1)
Lymphs: 16 %
MCH: 27.6 pg (ref 26.6–33.0)
MCHC: 32.2 g/dL (ref 31.5–35.7)
MCV: 86 fL (ref 79–97)
Monocytes Absolute: 0.8 10*3/uL (ref 0.1–0.9)
Monocytes: 6 %
Neutrophils Absolute: 10.7 10*3/uL — ABNORMAL HIGH (ref 1.4–7.0)
Neutrophils: 77 %
Platelets: 269 10*3/uL (ref 150–450)
RBC: 5.19 x10E6/uL (ref 4.14–5.80)
RDW: 13.6 % (ref 11.6–15.4)
WBC: 13.9 10*3/uL — ABNORMAL HIGH (ref 3.4–10.8)

## 2020-06-13 LAB — LIPID PANEL
Chol/HDL Ratio: 3.8 ratio (ref 0.0–5.0)
Cholesterol, Total: 135 mg/dL (ref 100–199)
HDL: 36 mg/dL — ABNORMAL LOW (ref >39)
LDL Chol Calc (NIH): 82 mg/dL (ref 0–99)
Triglycerides: 87 mg/dL (ref 0–149)
VLDL Cholesterol Cal: 17 mg/dL (ref 5–40)

## 2020-06-13 LAB — PSA, TOTAL AND FREE
PSA, Free Pct: 22.4 %
PSA, Free: 0.38 ng/mL
Prostate Specific Ag, Serum: 1.7 ng/mL (ref 0.0–4.0)

## 2020-06-16 NOTE — Telephone Encounter (Signed)
Patient aware and verbalized understanding. °

## 2020-06-18 LAB — URINE CULTURE

## 2020-06-26 ENCOUNTER — Other Ambulatory Visit: Payer: Self-pay | Admitting: *Deleted

## 2020-06-26 DIAGNOSIS — E782 Mixed hyperlipidemia: Secondary | ICD-10-CM

## 2020-06-26 DIAGNOSIS — E876 Hypokalemia: Secondary | ICD-10-CM

## 2020-06-26 DIAGNOSIS — G8929 Other chronic pain: Secondary | ICD-10-CM

## 2020-06-26 DIAGNOSIS — I10 Essential (primary) hypertension: Secondary | ICD-10-CM

## 2020-06-26 DIAGNOSIS — E1140 Type 2 diabetes mellitus with diabetic neuropathy, unspecified: Secondary | ICD-10-CM

## 2020-06-26 MED ORDER — MELOXICAM 7.5 MG PO TABS: 7.5000 mg | ORAL_TABLET | Freq: Every day | ORAL | 1 refills | Status: DC

## 2020-06-26 MED ORDER — METFORMIN HCL 500 MG PO TABS: 500.0000 mg | ORAL_TABLET | Freq: Two times a day (BID) | ORAL | 1 refills | Status: DC

## 2020-06-26 MED ORDER — LISINOPRIL 10 MG PO TABS: 10.0000 mg | ORAL_TABLET | Freq: Every day | ORAL | 1 refills | Status: DC

## 2020-06-26 MED ORDER — POTASSIUM CITRATE ER 15 MEQ (1620 MG) PO TBCR: 2.0000 | EXTENDED_RELEASE_TABLET | Freq: Two times a day (BID) | ORAL | 1 refills | Status: DC

## 2020-06-26 MED ORDER — ATORVASTATIN CALCIUM 10 MG PO TABS: 10.0000 mg | ORAL_TABLET | Freq: Every day | ORAL | 1 refills | Status: DC

## 2020-06-26 MED ORDER — GLIPIZIDE ER 5 MG PO TB24: 5.0000 mg | ORAL_TABLET | Freq: Every day | ORAL | 1 refills | Status: DC

## 2020-06-26 MED ORDER — GABAPENTIN 100 MG PO CAPS: ORAL_CAPSULE | ORAL | 1 refills | Status: DC

## 2020-06-26 MED ORDER — OMEPRAZOLE 40 MG PO CPDR: 40.0000 mg | DELAYED_RELEASE_CAPSULE | Freq: Two times a day (BID) | ORAL | 1 refills | Status: DC

## 2020-06-27 ENCOUNTER — Telehealth: Payer: Self-pay | Admitting: Nurse Practitioner

## 2020-06-27 DIAGNOSIS — M549 Dorsalgia, unspecified: Secondary | ICD-10-CM

## 2020-06-27 DIAGNOSIS — G8929 Other chronic pain: Secondary | ICD-10-CM

## 2020-06-27 MED ORDER — GABAPENTIN 100 MG PO CAPS: ORAL_CAPSULE | ORAL | 0 refills | Status: DC

## 2020-06-27 NOTE — Telephone Encounter (Signed)
  Prescription Request  06/27/2020  What is the name of the medication or equipment? Gabapentin 100mg   Have you contacted your pharmacy to request a refill? (if applicable) no  Which pharmacy would you like this sent to? [facility]   Patient notified that their request is being sent to the clinical staff for review and that they should receive a response within 2 business days.    MMM's pt  Pt has ordered his meds thru [facility], but wants 2 weeks supply until Beatrice gets here.

## 2020-06-27 NOTE — Telephone Encounter (Signed)
Pharm changed

## 2020-06-30 ENCOUNTER — Ambulatory Visit: Payer: BC Managed Care – PPO | Admitting: Nurse Practitioner

## 2020-06-30 ENCOUNTER — Other Ambulatory Visit: Payer: Self-pay

## 2020-06-30 ENCOUNTER — Encounter: Payer: Self-pay | Admitting: Nurse Practitioner

## 2020-06-30 VITALS — BP 118/81 | HR 91 | Temp 97.7°F | Ht 67.0 in | Wt 218.0 lb

## 2020-06-30 DIAGNOSIS — W57XXXA Bitten or stung by nonvenomous insect and other nonvenomous arthropods, initial encounter: Secondary | ICD-10-CM

## 2020-06-30 DIAGNOSIS — S80862A Insect bite (nonvenomous), left lower leg, initial encounter: Secondary | ICD-10-CM | POA: Diagnosis not present

## 2020-06-30 MED ORDER — DOXYCYCLINE HYCLATE 200 MG PO TBEC: 200.0000 mg | DELAYED_RELEASE_TABLET | Freq: Once | ORAL | 0 refills | Status: AC

## 2020-06-30 NOTE — Patient Instructions (Signed)
Tick Bite Information, Adult  Ticks are insects that can bite. Most ticks live in shrubs and grassy areas. They climb onto people and animals that go by. Then they bite. Some ticks carrygerms that can make you sick. How can I prevent tick bites? Take these steps: Use insect repellent Use an insect repellent that has 20% or higher of the ingredients DEET, picaridin, or IR3535. Follow the instructions on the label. Put it on: Bare skin. The tops of your boots. Your pant legs. The ends of your sleeves. If you use an insect repellent that has the ingredient permethrin, follow the instructions on the label. Put it on: Clothing. Boots. Supplies or outdoor gear. Tents. When you are outside Wear long sleeves and long pants. Wear light-colored clothes. Tuck your pant legs into your socks. Stay in the middle of the trail. Do not touch the bushes. Avoid walking through long grass. Check for ticks on your clothes, hair, and skin often while you are outside. Before going inside your house, check your clothes, skin, head, neck, armpits, waist, groin, and joint areas. When you go indoors Check your clothes for ticks. Dry your clothes in a dryer on high heat for 10 minutes or more. If clothes are damp, additional time may be needed. Wash your clothes right away if they need to be washed. Use hot water. Check your pets and outdoor gear. Shower right away. Check your body for ticks. Do a full body check using a mirror. What is the right way to remove a tick? Remove the tick from your skin as soon as possible. Do not remove the tick with your bare fingers. To remove a tick that is crawling on your skin: Go outdoors and brush the tick off. Use tape or a lint roller. To remove a tick that is biting: Wash your hands. If you have latex gloves, put them on. Use tweezers, curved forceps, or a tick-removal tool to grasp the tick. Grasp the tick as close to your skin and as close to the tick's head as  possible. Gently pull up until the tick lets go. Try to keep the tick's head attached to its body. Do not twist or jerk the tick. Do not squeeze or crush the tick. Do not try to remove a tick with heat, alcohol, petroleum jelly, or fingernail polish. What should I do after taking out a tick? Throw away the tick. Do not crush a tick with your fingers. Clean the bite area and your hands with soap and water, rubbing alcohol, or an iodine wash. If an antiseptic cream or ointment is available, apply a small amount to the bite area. Wash and disinfect any instruments that you used to remove the tick. How should I get rid of a live tick? To dispose of a live tick, use one of these methods: Place the tick in rubbing alcohol. Place the tick in a bag or container you can close tightly. Wrap the tick tightly in tape. Flush the tick down the toilet. Contact a doctor if: You have symptoms, such as: A fever or chills. A red rash that makes a circle (bull's-eye rash) in the bite area. Redness and swelling where the tick bit you. Headache. Pain in a muscle, joint, or bone. Being more tired than normal. Trouble walking or moving your legs. Numbness in your legs. Tender and swollen lymph glands. A part of a tick breaks off and gets stuck in your skin. Get help right away if: You cannot remove a   tick. You cannot move (have paralysis) or feel weak. You are feeling worse or have new symptoms. You find a tick that is biting you and filled with blood. This is important if you are in an area where diseases from ticks are common. Summary Ticks may carry germs that can make you sick. To prevent tick bites wear long sleeves, long pants, and light colors. Use insect repellent. Follow the instructions on the label. If the tick is biting, do not try to remove it with heat, alcohol, petroleum jelly, or fingernail polish. Use tweezers, curved forceps, or a tick-removal tool to grasp the tick. Gently pull up  until the tick lets go. Do not twist or jerk the tick. Do not squeeze or crush the tick. If you have symptoms, contact a doctor. This information is not intended to replace advice given to you by your health care provider. Make sure you discuss any questions you have with your healthcare provider. Document Revised: 12/25/2018 Document Reviewed: 12/25/2018 Elsevier Patient Education  2022 Elsevier Inc.  

## 2020-06-30 NOTE — Progress Notes (Signed)
Acute Office Visit  Subjective:    Patient ID: Cameron Noble, male    DOB: [birth year], 60 y.o.   MRN: [id number]  Chief Complaint  Patient presents with   Tick Removal    HPI Patient is a 60 year old male presents to clinic for tick bite.  Patient cannot remember how long tick was stuck on his skin.  He presents take today in clinic.  Patient was able to remove fatigue and wiped.  With alcohol.  Reporting slight tenderness and pain in bite area.  No flulike symptoms, nausea vomiting body aches.  Past Medical History:  Diagnosis Date   Allergic rhinitis    Chronic back pain    DDD (degenerative disc disease), cervical    Diabetes mellitus    Diverticulosis    Hyperlipidemia    Hypertension    kidney stones    Kidney stones    multiple stones    URI (upper respiratory infection)    UTI (lower urinary tract infection) 2017    Past Surgical History:  Procedure Laterality Date   BACK SURGERY  03/2002   Dr. Trenton Gammon    CYSTOSCOPY WITH STENT PLACEMENT Left 01/27/2015   Procedure: CYSTOSCOPY, RETROGRADE, WITH LEFT STENT PLACEMENT;  Surgeon: Irine Seal, MD;  Location: [facility];  Service: Urology;  Laterality: Left;  I have 2 cases at [facility] and a foley to place at [facility].  I would guess it will be 830-9 before I get up to [facility].    DDD C-Spine Repair     KNEE ARTHROSCOPY Right     Family History  Problem Relation Age of Onset   Diabetes Mother    Hypertension Mother    Colon cancer Neg Hx    Esophageal cancer Neg Hx    Rectal cancer Neg Hx    Stomach cancer Neg Hx     Social History   Socioeconomic History   Marital status: Married    Spouse name: Not on file   Number of children: Not on file   Years of education: Not on file   Highest education level: Not on file  Occupational History   Not on file  Tobacco Use   Smoking status: Never   Smokeless tobacco: Never  Substance and Sexual Activity   Alcohol use: No    Comment: none   Drug use: No   Sexual activity: Not on file   Other Topics Concern   Not on file  Social History Narrative   Not on file   Social Determinants of Health   Financial Resource Strain: Not on file  Food Insecurity: Not on file  Transportation Needs: Not on file  Physical Activity: Not on file  Stress: Not on file  Social Connections: Not on file  Intimate Partner Violence: Not on file    Outpatient Medications Prior to Visit  Medication Sig Dispense Refill   ammonium lactate (AMLACTIN) 12 % cream APPLY TO DRY SKIN ON BOTTOM OF FEET 2 TIMES A DAY     aspirin 81 MG tablet Take 81 mg by mouth daily.     atorvastatin (LIPITOR) 10 MG tablet Take 1 tablet (10 mg total) by mouth daily. 90 tablet 1   azelastine (OPTIVAR) 0.05 % ophthalmic solution SMARTSIG:1 Drop(s) In Eye(s) Every 12 Hours     Cholecalciferol (VITAMIN D-3 PO) Take 1 tablet by mouth daily.     ciclopirox (PENLAC) 8 % solution Apply topically at bedtime. Apply over nail and surrounding skin. Apply daily over previous coat.  After seven (7) days, may remove with alcohol and continue cycle. 6.6 mL 3   fish oil-omega-3 fatty acids 1000 MG capsule Take 2 g by mouth daily.     gabapentin (NEURONTIN) 100 MG capsule TAKE 1 TO 2 CAPSULES BY MOUTH AT BEDTIME 180 capsule 0   glipiZIDE (GLUCOTROL XL) 5 MG 24 hr tablet Take 1 tablet (5 mg total) by mouth daily with breakfast. 90 tablet 1   lisinopril (ZESTRIL) 10 MG tablet Take 1 tablet (10 mg total) by mouth daily. 90 tablet 1   meloxicam (MOBIC) 7.5 MG tablet Take 1 tablet (7.5 mg total) by mouth daily. 90 tablet 1   metFORMIN (GLUCOPHAGE) 500 MG tablet Take 1 tablet (500 mg total) by mouth 2 (two) times daily with a meal. 180 tablet 1   omeprazole (PRILOSEC) 40 MG capsule Take 1 capsule (40 mg total) by mouth in the morning and at bedtime. 180 capsule 1   Potassium Citrate 15 MEQ (1620 MG) TBCR Take 2 tablets by mouth 2 (two) times daily. 360 tablet 1   cephALEXin (KEFLEX) 500 MG capsule Take 1 capsule (500 mg total) by mouth 2  (two) times daily. 14 capsule 0   No facility-administered medications prior to visit.    No Known Allergies  Review of Systems  Constitutional: Negative.   HENT: Negative.    Eyes: Negative.   Respiratory: Negative.    Cardiovascular: Negative.   Genitourinary: Negative.   Skin:  Positive for color change and rash.  All other systems reviewed and are negative.     Objective:    Physical Exam Vitals and nursing note reviewed.  Constitutional:      Appearance: Normal appearance.  HENT:     Head: Normocephalic.     Nose: Nose normal.  Eyes:     Conjunctiva/sclera: Conjunctivae normal.  Cardiovascular:     Pulses: Normal pulses.     Heart sounds: Normal heart sounds.  Pulmonary:     Effort: Pulmonary effort is normal.     Breath sounds: Normal breath sounds.  Abdominal:     General: Bowel sounds are normal.  Skin:    General: Skin is warm.     Findings: Erythema and rash present.  Neurological:     Mental Status: He is alert and oriented to person, place, and time.  Psychiatric:        Behavior: Behavior normal.    BP 118/81   Pulse 91   Temp 97.7 F (36.5 C) (Temporal)   Ht 5' 7"  (1.702 m)   Wt 218 lb (98.9 kg)   SpO2 97%   BMI 34.14 kg/m  Wt Readings from Last 3 Encounters:  06/30/20 218 lb (98.9 kg)  06/12/20 218 lb (98.9 kg)  03/06/20 212 lb (96.2 kg)    Health Maintenance Due  Topic Date Due   COVID-19 Vaccine (4 - Booster for Moderna series) 04/11/2020    There are no preventive care reminders to display for this patient.   No results found for: TSH Lab Results  Component Value Date   WBC 13.9 (H) 06/12/2020   HGB 14.3 06/12/2020   HCT 44.4 06/12/2020   MCV 86 06/12/2020   PLT 269 06/12/2020   Lab Results  Component Value Date   NA 140 06/12/2020   K 4.4 06/12/2020   CO2 23 06/12/2020   GLUCOSE 262 (H) 06/12/2020   BUN 15 06/12/2020   CREATININE 1.37 (H) 06/12/2020   BILITOT 0.5 06/12/2020   ALKPHOS 139 (  H) 06/12/2020   AST 15  06/12/2020   ALT 18 06/12/2020   PROT 7.5 06/12/2020   ALBUMIN 4.2 06/12/2020   CALCIUM 9.6 06/12/2020   ANIONGAP 10 02/06/2015   EGFR 58 (L) 06/12/2020   Lab Results  Component Value Date   CHOL 135 06/12/2020   Lab Results  Component Value Date   HDL 36 (L) 06/12/2020   Lab Results  Component Value Date   LDLCALC 82 06/12/2020   Lab Results  Component Value Date   TRIG 87 06/12/2020   Lab Results  Component Value Date   CHOLHDL 3.8 06/12/2020   Lab Results  Component Value Date   HGBA1C 6.9 06/12/2020       Assessment & Plan:   Problem List Items Addressed This Visit       Musculoskeletal and Integument   Tick bite of left lower leg - Primary    Prophylaxis education completed for Lyme disease.  Doxycycline 200 mg tablet by mouth once.  Lyme ABS/Western blot labs in 2 to 3 weeks.  Rx sent to pharmacy.  Patient knows to follow-up with flulike symptoms. Education provided to patient with printed handouts given.        Relevant Orders   Lyme Disease Serology w/Reflex     Meds ordered this encounter  Medications   Doxycycline Hyclate 200 MG TBEC    Sig: Take 200 mg by mouth once for 1 dose.    Dispense:  1 tablet    Refill:  0    Order Specific Question:   Supervising Provider    Answer:   Janora Norlander [[id number]]     Ivy Lynn, NP

## 2020-07-01 ENCOUNTER — Telehealth: Payer: Self-pay | Admitting: *Deleted

## 2020-07-01 DIAGNOSIS — S80862A Insect bite (nonvenomous), left lower leg, initial encounter: Secondary | ICD-10-CM

## 2020-07-01 DIAGNOSIS — W57XXXA Bitten or stung by nonvenomous insect and other nonvenomous arthropods, initial encounter: Secondary | ICD-10-CM

## 2020-07-01 NOTE — Telephone Encounter (Signed)
PA in process  Key: [id number] - Rx #: [id number] Need help? Call us at [phone] Status Additional Information Required Drug Doxycycline Hyclate 200MG  dr tablets

## 2020-07-01 NOTE — Telephone Encounter (Signed)
Gabriel Cirri from [facility] calling--doxycycline change to100MG  and take two tablets for insurance to pay--or does he need to wait for PA

## 2020-07-02 MED ORDER — DOXYCYCLINE HYCLATE 100 MG PO TABS: 200.0000 mg | ORAL_TABLET | Freq: Once | ORAL | 0 refills | Status: AC

## 2020-07-02 NOTE — Telephone Encounter (Signed)
Prescription changed

## 2020-07-02 NOTE — Telephone Encounter (Signed)
Left detailed message.   

## 2020-07-02 NOTE — Assessment & Plan Note (Signed)
Prophylaxis education completed for Lyme disease.  Doxycycline 200 mg tablet by mouth once.  Lyme ABS/Western blot labs in 2 to 3 weeks.  Rx sent to pharmacy.  Patient knows to follow-up with flulike symptoms. Education provided to patient with printed handouts given.

## 2020-07-15 ENCOUNTER — Other Ambulatory Visit: Payer: BC Managed Care – PPO

## 2020-07-15 ENCOUNTER — Other Ambulatory Visit: Payer: Self-pay

## 2020-07-15 DIAGNOSIS — S80862A Insect bite (nonvenomous), left lower leg, initial encounter: Secondary | ICD-10-CM | POA: Diagnosis not present

## 2020-07-15 DIAGNOSIS — W57XXXA Bitten or stung by nonvenomous insect and other nonvenomous arthropods, initial encounter: Secondary | ICD-10-CM | POA: Diagnosis not present

## 2020-07-16 LAB — LYME DISEASE SEROLOGY W/REFLEX: Lyme Total Antibody EIA: NEGATIVE

## 2020-08-20 ENCOUNTER — Other Ambulatory Visit: Payer: Self-pay | Admitting: Nurse Practitioner

## 2020-09-16 ENCOUNTER — Other Ambulatory Visit: Payer: Self-pay

## 2020-09-16 ENCOUNTER — Encounter: Payer: Self-pay | Admitting: Nurse Practitioner

## 2020-09-16 ENCOUNTER — Ambulatory Visit: Payer: BC Managed Care – PPO | Admitting: Nurse Practitioner

## 2020-09-16 VITALS — BP 130/89 | HR 78 | Temp 97.8°F | Resp 20 | Ht 67.0 in | Wt 220.0 lb

## 2020-09-16 DIAGNOSIS — I10 Essential (primary) hypertension: Secondary | ICD-10-CM | POA: Diagnosis not present

## 2020-09-16 DIAGNOSIS — I495 Sick sinus syndrome: Secondary | ICD-10-CM

## 2020-09-16 DIAGNOSIS — E782 Mixed hyperlipidemia: Secondary | ICD-10-CM

## 2020-09-16 DIAGNOSIS — Z6829 Body mass index (BMI) 29.0-29.9, adult: Secondary | ICD-10-CM

## 2020-09-16 DIAGNOSIS — E1142 Type 2 diabetes mellitus with diabetic polyneuropathy: Secondary | ICD-10-CM | POA: Diagnosis not present

## 2020-09-16 DIAGNOSIS — E876 Hypokalemia: Secondary | ICD-10-CM | POA: Diagnosis not present

## 2020-09-16 DIAGNOSIS — M549 Dorsalgia, unspecified: Secondary | ICD-10-CM

## 2020-09-16 DIAGNOSIS — G8929 Other chronic pain: Secondary | ICD-10-CM

## 2020-09-16 LAB — CMP14+EGFR
ALT: 21 IU/L (ref 0–44)
AST: 16 IU/L (ref 0–40)
Albumin/Globulin Ratio: 1.3 (ref 1.2–2.2)
Albumin: 4.3 g/dL (ref 3.8–4.8)
Alkaline Phosphatase: 130 IU/L — ABNORMAL HIGH (ref 44–121)
BUN/Creatinine Ratio: 13 (ref 10–24)
BUN: 19 mg/dL (ref 8–27)
Bilirubin Total: 0.4 mg/dL (ref 0.0–1.2)
CO2: 21 mmol/L (ref 20–29)
Calcium: 10.4 mg/dL — ABNORMAL HIGH (ref 8.6–10.2)
Chloride: 105 mmol/L (ref 96–106)
Creatinine, Ser: 1.43 mg/dL — ABNORMAL HIGH (ref 0.76–1.27)
Globulin, Total: 3.2 g/dL (ref 1.5–4.5)
Glucose: 169 mg/dL — ABNORMAL HIGH (ref 65–99)
Potassium: 4.3 mmol/L (ref 3.5–5.2)
Sodium: 141 mmol/L (ref 134–144)
Total Protein: 7.5 g/dL (ref 6.0–8.5)
eGFR: 55 mL/min/{1.73_m2} — ABNORMAL LOW (ref >59)

## 2020-09-16 LAB — CBC WITH DIFFERENTIAL/PLATELET
Basophils Absolute: 0 10*3/uL (ref 0.0–0.2)
Basos: 0 %
EOS (ABSOLUTE): 0.3 10*3/uL (ref 0.0–0.4)
Eos: 3 %
Hematocrit: 44.5 % (ref 37.5–51.0)
Hemoglobin: 14.8 g/dL (ref 13.0–17.7)
Immature Grans (Abs): 0 10*3/uL (ref 0.0–0.1)
Immature Granulocytes: 0 %
Lymphocytes Absolute: 3.3 10*3/uL — ABNORMAL HIGH (ref 0.7–3.1)
Lymphs: 29 %
MCH: 27.9 pg (ref 26.6–33.0)
MCHC: 33.3 g/dL (ref 31.5–35.7)
MCV: 84 fL (ref 79–97)
Monocytes Absolute: 0.9 10*3/uL (ref 0.1–0.9)
Monocytes: 8 %
Neutrophils Absolute: 6.9 10*3/uL (ref 1.4–7.0)
Neutrophils: 60 %
Platelets: 291 10*3/uL (ref 150–450)
RBC: 5.3 x10E6/uL (ref 4.14–5.80)
RDW: 13.7 % (ref 11.6–15.4)
WBC: 11.4 10*3/uL — ABNORMAL HIGH (ref 3.4–10.8)

## 2020-09-16 LAB — LIPID PANEL
Chol/HDL Ratio: 3.8 ratio (ref 0.0–5.0)
Cholesterol, Total: 138 mg/dL (ref 100–199)
HDL: 36 mg/dL — ABNORMAL LOW (ref >39)
LDL Chol Calc (NIH): 88 mg/dL (ref 0–99)
Triglycerides: 72 mg/dL (ref 0–149)
VLDL Cholesterol Cal: 14 mg/dL (ref 5–40)

## 2020-09-16 LAB — BAYER DCA HB A1C WAIVED: HB A1C (BAYER DCA - WAIVED): 8.7 % — ABNORMAL HIGH (ref 4.8–5.6)

## 2020-09-16 MED ORDER — METFORMIN HCL 1000 MG PO TABS: 1000.0000 mg | ORAL_TABLET | Freq: Two times a day (BID) | ORAL | 1 refills | Status: DC

## 2020-09-16 MED ORDER — GABAPENTIN 100 MG PO CAPS: ORAL_CAPSULE | ORAL | 0 refills | Status: DC

## 2020-09-16 NOTE — Patient Instructions (Signed)
Back Exercises These exercises help to make your trunk and back strong. They also help to keep the lower back flexible. Doing these exercises can help to prevent or lessen pain in your lower back. If you have back pain, try to do these exercises 2-3 times each day or as told by your doctor. As you get better, do the exercises once each day. Repeat the exercises more often as told by your doctor. To stop back pain from coming back, do the exercises once each day, or as told by your doctor. Do exercises exactly as told by your doctor. Stop right away if you feel sudden pain or your pain gets worse. Exercises Single knee to chest Do these steps 3-5 times in a row for each leg: Lie on your back on a firm bed or the floor with your legs stretched out. Bring one knee to your chest. Grab your knee or thigh with both hands and hold it in place. Pull on your knee until you feel a gentle stretch in your lower back or butt. Keep doing the stretch for 10-30 seconds. Slowly let go of your leg and straighten it. Pelvic tilt Do these steps 5-10 times in a row: Lie on your back on a firm bed or the floor with your legs stretched out. Bend your knees so they point up to the ceiling. Your feet should be flat on the floor. Tighten your lower belly (abdomen) muscles to press your lower back against the floor. This will make your tailbone point up to the ceiling instead of pointing down to your feet or the floor. Stay in this position for 5-10 seconds while you gently tighten your muscles and breathe evenly. Cat-cow Do these steps until your lower back bends more easily: Get on your hands and knees on a firm bed or the floor. Keep your hands under your shoulders, and keep your knees under your hips. You may put padding under your knees. Let your head hang down toward your chest. Tighten (contract) the muscles in your belly. Point your tailbone toward the floor so your lower back becomes rounded like the back of a  cat. Stay in this position for 5 seconds. Slowly lift your head. Let the muscles of your belly relax. Point your tailbone up toward the ceiling so your back forms a sagging arch like the back of a cow. Stay in this position for 5 seconds.  Press-ups Do these steps 5-10 times in a row: Lie on your belly (face-down) on a firm bed or the floor. Place your hands near your head, about shoulder-width apart. While you keep your back relaxed and keep your hips on the floor, slowly straighten your arms to raise the top half of your body and lift your shoulders. Do not use your back muscles. You may change where you place your hands to make yourself more comfortable. Stay in this position for 5 seconds. Keep your back relaxed. Slowly return to lying flat on the floor.  Bridges Do these steps 10 times in a row: Lie on your back on a firm bed or the floor. Bend your knees so they point up to the ceiling. Your feet should be flat on the floor. Your arms should be flat at your sides, next to your body. Tighten your butt muscles and lift your butt off the floor until your waist is almost as high as your knees. If you do not feel the muscles working in your butt and the back of   your thighs, slide your feet 1-2 inches (2.5-5 cm) farther away from your butt. Stay in this position for 3-5 seconds. Slowly lower your butt to the floor, and let your butt muscles relax. If this exercise is too easy, try doing it with your arms crossed over your chest. Belly crunches Do these steps 5-10 times in a row: Lie on your back on a firm bed or the floor with your legs stretched out. Bend your knees so they point up to the ceiling. Your feet should be flat on the floor. Cross your arms over your chest. Tip your chin a little bit toward your chest, but do not bend your neck. Tighten your belly muscles and slowly raise your chest just enough to lift your shoulder blades a tiny bit off the floor. Avoid raising your body  higher than that because it can put too much stress on your lower back. Slowly lower your chest and your head to the floor. Back lifts Do these steps 5-10 times in a row: Lie on your belly (face-down) with your arms at your sides, and rest your forehead on the floor. Tighten the muscles in your legs and your butt. Slowly lift your chest off the floor while you keep your hips on the floor. Keep the back of your head in line with the curve in your back. Look at the floor while you do this. Stay in this position for 3-5 seconds. Slowly lower your chest and your face to the floor. Contact a doctor if: Your back pain gets a lot worse when you do an exercise. Your back pain does not get better within 2 hours after you exercise. If you have any of these problems, stop doing the exercises. Do not do them again unless your doctor says it is okay. Get help right away if: You have sudden, very bad back pain. If this happens, stop doing the exercises. Do not do them again unless your doctor says it is okay. This information is not intended to replace advice given to you by your health care provider. Make sure you discuss any questions you have with your health care provider. Document Revised: 03/12/2020 Document Reviewed: 03/12/2020 Elsevier Patient Education  [employer].

## 2020-09-16 NOTE — Progress Notes (Signed)
Subjective:    Patient ID: Cameron Noble, male    DOB: 10/07/1960, 60 y.o.   MRN: [id number]   Chief Complaint: medical management of chronic issues     HPI:  1. Essential hypertension, benign No c/o chest pain, sob or headache. Doe snot check blood pressure a home. BP Readings from Last 3 Encounters:  06/30/20 118/81  06/12/20 136/89  03/06/20 121/82     2. Mixed hyperlipidemia Doe snot rally watch diet and does no dedicated exercise. Lab Results  Component Value Date   CHOL 135 06/12/2020   HDL 36 (L) 06/12/2020   LDLCALC 82 06/12/2020   TRIG 87 06/12/2020   CHOLHDL 3.8 06/12/2020     3. Type 2 diabetes mellitus with diabetic polyneuropathy, without long-term current use of insulin (HCC) He does not check his blood sugars very often. Ha snot been wathcing diet veery closely. Lab Results  Component Value Date   HGBA1C 6.9 06/12/2020     4. Hypokalemia No c/o lower ext cramping Lab Results  Component Value Date   K 4.4 06/12/2020     5. Sinoatrial node dysfunction (HCC) Has not seen cardiology in several years.  6. Chronic back pain, unspecified back location, unspecified back pain laterality Has chronic back pain. Takes motrin or tylenol which works fine for now.  7. BMI 29.0-29.9,adult Weight is up 2 lbs Wt Readings from Last 3 Encounters:  09/16/20 220 lb (99.8 kg)  06/30/20 218 lb (98.9 kg)  06/12/20 218 lb (98.9 kg)   BMI Readings from Last 3 Encounters:  09/16/20 34.46 kg/m  06/30/20 34.14 kg/m  06/12/20 34.14 kg/m       Outpatient Encounter Medications as of 09/16/2020  Medication Sig   ammonium lactate (AMLACTIN) 12 % cream APPLY TO DRY SKIN ON BOTTOM OF FEET 2 TIMES A DAY   aspirin 81 MG tablet Take 81 mg by mouth daily.   atorvastatin (LIPITOR) 10 MG tablet Take 1 tablet (10 mg total) by mouth daily.   azelastine (OPTIVAR) 0.05 % ophthalmic solution SMARTSIG:1 Drop(s) In Eye(s) Every 12 Hours   Cholecalciferol (VITAMIN D-3 PO)  Take 1 tablet by mouth daily.   ciclopirox (PENLAC) 8 % solution Apply topically at bedtime. Apply over nail and surrounding skin. Apply daily over previous coat. After seven (7) days, may remove with alcohol and continue cycle.   fish oil-omega-3 fatty acids 1000 MG capsule Take 2 g by mouth daily.   gabapentin (NEURONTIN) 100 MG capsule TAKE 1 TO 2 CAPSULES BY MOUTH AT BEDTIME   glipiZIDE (GLUCOTROL XL) 5 MG 24 hr tablet Take 1 tablet (5 mg total) by mouth daily with breakfast.   lisinopril (ZESTRIL) 10 MG tablet Take 1 tablet (10 mg total) by mouth daily.   meloxicam (MOBIC) 7.5 MG tablet Take 1 tablet (7.5 mg total) by mouth daily.   metFORMIN (GLUCOPHAGE) 500 MG tablet Take 1 tablet (500 mg total) by mouth 2 (two) times daily with a meal.   omeprazole (PRILOSEC) 40 MG capsule Take 1 capsule by mouth once daily   Potassium Citrate 15 MEQ (1620 MG) TBCR Take 2 tablets by mouth 2 (two) times daily.   No facility-administered encounter medications on file as of 09/16/2020.    Past Surgical History:  Procedure Laterality Date   BACK SURGERY  03/2002   Dr. Trenton Gammon    CYSTOSCOPY WITH STENT PLACEMENT Left 01/27/2015   Procedure: CYSTOSCOPY, RETROGRADE, WITH LEFT STENT PLACEMENT;  Surgeon: Irine Seal, MD;  Location: [facility];  Service: Urology;  Laterality: Left;  I have 2 cases at [facility] and a foley to place at [facility].  I would guess it will be 830-9 before I get up to [facility].    DDD C-Spine Repair     KNEE ARTHROSCOPY Right     Family History  Problem Relation Age of Onset   Diabetes Mother    Hypertension Mother    Colon cancer Neg Hx    Esophageal cancer Neg Hx    Rectal cancer Neg Hx    Stomach cancer Neg Hx     New complaints: None today  Social history: Lives with his wife  Controlled substance contract: n/a      Review of Systems  Constitutional:  Negative for diaphoresis.  Eyes:  Negative for pain.  Respiratory:  Negative for shortness of breath.   Cardiovascular:  Negative for  chest pain, palpitations and leg swelling.  Gastrointestinal:  Negative for abdominal pain.  Endocrine: Negative for polydipsia.  Skin:  Negative for rash.  Neurological:  Negative for dizziness, weakness and headaches.  Hematological:  Does not bruise/bleed easily.  All other systems reviewed and are negative.     Objective:   Physical Exam Vitals and nursing note reviewed.  Constitutional:      Appearance: Normal appearance. He is well-developed.  HENT:     Head: Normocephalic.     Nose: Nose normal.  Eyes:     Pupils: Pupils are equal, round, and reactive to light.  Neck:     Thyroid: No thyroid mass or thyromegaly.     Vascular: No carotid bruit or JVD.     Trachea: Phonation normal.  Cardiovascular:     Rate and Rhythm: Normal rate and regular rhythm.  Pulmonary:     Effort: Pulmonary effort is normal. No respiratory distress.     Breath sounds: Normal breath sounds.  Abdominal:     General: Bowel sounds are normal.     Palpations: Abdomen is soft.     Tenderness: There is no abdominal tenderness.  Musculoskeletal:        General: Normal range of motion.     Cervical back: Normal range of motion and neck supple.  Lymphadenopathy:     Cervical: No cervical adenopathy.  Skin:    General: Skin is warm and dry.  Neurological:     Mental Status: He is alert and oriented to person, place, and time.  Psychiatric:        Behavior: Behavior normal.        Thought Content: Thought content normal.        Judgment: Judgment normal.    BP 130/89   Pulse 78   Temp 97.8 F (36.6 C) (Temporal)   Resp 20   Ht 5' 7"  (1.702 m)   Wt 220 lb (99.8 kg)   SpO2 93%   BMI 34.46 kg/m   Hgba1c 8.7%      Assessment & Plan:  Cameron Noble comes in today with chief complaint of Medical Management of Chronic Issues   Diagnosis and orders addressed:  1. Essential hypertension, benign Low sodium diet - CBC with Differential/Platelet - CMP14+EGFR  2. Mixed  hyperlipidemia Low fat diet - Lipid panel  3. Type 2 diabetes mellitus with diabetic polyneuropathy, without long-term current use of insulin (HCC) Stricter carb counting Increase metformin from 520m BID to 10043mBID Need to keep diary of daily fasting blood sugars - Bayer DCA Hb A1c Waived - metFORMIN (GLUCOPHAGE) 1000 MG tablet;  Take 1 tablet (1,000 mg total) by mouth 2 (two) times daily with a meal.  Dispense: 180 tablet; Refill: 1  4. Hypokalemia Labs pending  5. Sinoatrial node dysfunction (HCC)  6. Chronic back pain, unspecified back location, unspecified back pain laterality Moist heat back stretches reviewed - gabapentin (NEURONTIN) 100 MG capsule; TAKE 1 TO 2 CAPSULES BY MOUTH AT BEDTIME  Dispense: 180 capsule; Refill: 0  7. BMI 29.0-29.9,adult Discussed diet and exercise for person with BMI >25 Will recheck weight in 3-6 months    Labs pending Health Maintenance reviewed Diet and exercise encouraged  Follow up plan: 3 months   [name], FNP

## 2020-09-19 NOTE — Progress Notes (Signed)
Pt r/c about labs 

## 2020-11-17 ENCOUNTER — Other Ambulatory Visit: Payer: Self-pay | Admitting: Nurse Practitioner

## 2020-12-05 ENCOUNTER — Other Ambulatory Visit: Payer: Self-pay | Admitting: Nurse Practitioner

## 2020-12-05 DIAGNOSIS — I10 Essential (primary) hypertension: Secondary | ICD-10-CM

## 2020-12-05 DIAGNOSIS — E782 Mixed hyperlipidemia: Secondary | ICD-10-CM

## 2020-12-05 DIAGNOSIS — E1140 Type 2 diabetes mellitus with diabetic neuropathy, unspecified: Secondary | ICD-10-CM

## 2020-12-18 ENCOUNTER — Encounter: Payer: Self-pay | Admitting: Nurse Practitioner

## 2020-12-18 ENCOUNTER — Ambulatory Visit: Payer: BC Managed Care – PPO | Admitting: Nurse Practitioner

## 2020-12-18 VITALS — BP 126/81 | HR 69 | Temp 97.9°F | Resp 20 | Ht 67.0 in | Wt 214.0 lb

## 2020-12-18 DIAGNOSIS — K625 Hemorrhage of anus and rectum: Secondary | ICD-10-CM

## 2020-12-18 DIAGNOSIS — E782 Mixed hyperlipidemia: Secondary | ICD-10-CM | POA: Diagnosis not present

## 2020-12-18 DIAGNOSIS — Z6833 Body mass index (BMI) 33.0-33.9, adult: Secondary | ICD-10-CM

## 2020-12-18 DIAGNOSIS — I10 Essential (primary) hypertension: Secondary | ICD-10-CM

## 2020-12-18 DIAGNOSIS — G8929 Other chronic pain: Secondary | ICD-10-CM

## 2020-12-18 DIAGNOSIS — E876 Hypokalemia: Secondary | ICD-10-CM

## 2020-12-18 DIAGNOSIS — K219 Gastro-esophageal reflux disease without esophagitis: Secondary | ICD-10-CM

## 2020-12-18 DIAGNOSIS — S161XXA Strain of muscle, fascia and tendon at neck level, initial encounter: Secondary | ICD-10-CM

## 2020-12-18 DIAGNOSIS — M549 Dorsalgia, unspecified: Secondary | ICD-10-CM

## 2020-12-18 DIAGNOSIS — E1142 Type 2 diabetes mellitus with diabetic polyneuropathy: Secondary | ICD-10-CM | POA: Diagnosis not present

## 2020-12-18 LAB — BAYER DCA HB A1C WAIVED: HB A1C (BAYER DCA - WAIVED): 7.5 % — ABNORMAL HIGH (ref 4.8–5.6)

## 2020-12-18 MED ORDER — GLIPIZIDE ER 5 MG PO TB24: 5.0000 mg | ORAL_TABLET | Freq: Every day | ORAL | 1 refills | Status: DC

## 2020-12-18 MED ORDER — POTASSIUM CITRATE ER 15 MEQ (1620 MG) PO TBCR: 2.0000 | EXTENDED_RELEASE_TABLET | Freq: Two times a day (BID) | ORAL | 1 refills | Status: DC

## 2020-12-18 MED ORDER — PREDNISONE 20 MG PO TABS
40.0000 mg | ORAL_TABLET | Freq: Every day | ORAL | 0 refills | Status: AC
Start: 2020-12-18 — End: 2020-12-23

## 2020-12-18 MED ORDER — METHOCARBAMOL 500 MG PO TABS: 500.0000 mg | ORAL_TABLET | Freq: Four times a day (QID) | ORAL | 1 refills | Status: DC

## 2020-12-18 MED ORDER — ATORVASTATIN CALCIUM 10 MG PO TABS: 10.0000 mg | ORAL_TABLET | Freq: Every day | ORAL | 1 refills | Status: DC

## 2020-12-18 MED ORDER — GABAPENTIN 100 MG PO CAPS: ORAL_CAPSULE | ORAL | 1 refills | Status: DC

## 2020-12-18 MED ORDER — OMEPRAZOLE 40 MG PO CPDR: 40.0000 mg | DELAYED_RELEASE_CAPSULE | Freq: Two times a day (BID) | ORAL | 1 refills | Status: DC

## 2020-12-18 MED ORDER — METFORMIN HCL 1000 MG PO TABS: 1000.0000 mg | ORAL_TABLET | Freq: Two times a day (BID) | ORAL | 1 refills | Status: DC

## 2020-12-18 MED ORDER — LISINOPRIL 10 MG PO TABS: 10.0000 mg | ORAL_TABLET | Freq: Every day | ORAL | 1 refills | Status: DC

## 2020-12-18 NOTE — Patient Instructions (Signed)

## 2020-12-18 NOTE — Progress Notes (Signed)
Subjective:    Patient ID: Cameron Noble, male    DOB: Jul 25, 1960, 60 y.o.   MRN: [id number]  Chief Complaint: Medical Management of Chronic Issues    HPI:  1. Essential hypertension, benign No c/o chest pain, sob or headache. Does not check blood pressure at home. BP Readings from Last 3 Encounters:  09/16/20 130/89  06/30/20 118/81  06/12/20 136/89     2. Mixed hyperlipidemia Does not watch diet very closely. Does no dedicated exercise. Lab Results  Component Value Date   CHOL 138 09/16/2020   HDL 36 (L) 09/16/2020   LDLCALC 88 09/16/2020   TRIG 72 09/16/2020   CHOLHDL 3.8 09/16/2020     3. Type 2 diabetes mellitus with diabetic polyneuropathy, without long-term current use of insulin ([facility]) He has not been checking his blood sugars at home. Metformin was increased to 1075m bid. Lab Results  Component Value Date   HGBA1C 8.7 (H) 09/16/2020     4. Hypokalemia No c/o lower ext cramping Lab Results  Component Value Date   K 4.3 09/16/2020     5. Chronic back pain, unspecified back location, unspecified back pain laterality Still has back pain daily. He just tylenol which helps. He is also on neurontin which seems to help.  6. Rectal bleeding No blood noted in stool.  7. BMI 34.0-34.9,adult Weight is done 6lbs since last visit Wt Readings from Last 3 Encounters:  12/18/20 214 lb (97.1 kg)  09/16/20 220 lb (99.8 kg)  06/30/20 218 lb (98.9 kg)   BMI Readings from Last 3 Encounters:  12/18/20 33.52 kg/m  09/16/20 34.46 kg/m  06/30/20 34.14 kg/m      Outpatient Encounter Medications as of 12/18/2020  Medication Sig   ammonium lactate (AMLACTIN) 12 % cream APPLY TO DRY SKIN ON BOTTOM OF FEET 2 TIMES A DAY   aspirin 81 MG tablet Take 81 mg by mouth daily.   atorvastatin (LIPITOR) 10 MG tablet TAKE 1 TABLET DAILY   azelastine (OPTIVAR) 0.05 % ophthalmic solution SMARTSIG:1 Drop(s) In Eye(s) Every 12 Hours   Cholecalciferol (VITAMIN D-3 PO) Take 1  tablet by mouth daily.   ciclopirox (PENLAC) 8 % solution Apply topically at bedtime. Apply over nail and surrounding skin. Apply daily over previous coat. After seven (7) days, may remove with alcohol and continue cycle.   fish oil-omega-3 fatty acids 1000 MG capsule Take 2 g by mouth daily.   gabapentin (NEURONTIN) 100 MG capsule TAKE 1 TO 2 CAPSULES BY MOUTH AT BEDTIME   glipiZIDE (GLUCOTROL XL) 5 MG 24 hr tablet TAKE 1 TABLET DAILY WITH BREAKFAST   lisinopril (ZESTRIL) 10 MG tablet TAKE 1 TABLET DAILY   meloxicam (MOBIC) 7.5 MG tablet TAKE 1 TABLET DAILY   metFORMIN (GLUCOPHAGE) 1000 MG tablet Take 1 tablet (1,000 mg total) by mouth 2 (two) times daily with a meal.   omeprazole (PRILOSEC) 40 MG capsule TAKE 1 CAPSULE IN THE MORNING AND AT BEDTIME   Potassium Citrate 15 MEQ (1620 MG) TBCR Take 2 tablets by mouth 2 (two) times daily.   No facility-administered encounter medications on file as of 12/18/2020.    Past Surgical History:  Procedure Laterality Date   BACK SURGERY  03/2002   Dr. PTrenton Gammon   CYSTOSCOPY WITH STENT PLACEMENT Left 01/27/2015   Procedure: CYSTOSCOPY, RETROGRADE, WITH LEFT STENT PLACEMENT;  Surgeon: JIrine Seal MD;  Location: [facility];  Service: Urology;  Laterality: Left;  [facility]  I would guess it will be 830-9 before I get up to [facility].    DDD C-Spine Repair     KNEE ARTHROSCOPY Right     Family History  Problem Relation Age of Onset   Diabetes Mother    Hypertension Mother    Colon cancer Neg Hx    Esophageal cancer Neg Hx    Rectal cancer Neg Hx    Stomach cancer Neg Hx     New complaints: Still have neck pain. Mainly on right side. Pain is constant ab=nd ranges from 2-10/10. He was using voltarne gel which did not help  Social history: Lives with is wife  Controlled substance contract: n/a     Review of Systems  Constitutional:  Negative for diaphoresis.  Eyes:  Negative for pain.  Respiratory:  Negative for  shortness of breath.   Cardiovascular:  Negative for chest pain, palpitations and leg swelling.  Gastrointestinal:  Negative for abdominal pain.  Endocrine: Negative for polydipsia.  Musculoskeletal:  Negative for neck pain (right side).  Skin:  Negative for rash.  Neurological:  Negative for dizziness, weakness and headaches.  Hematological:  Does not bruise/bleed easily.  All other systems reviewed and are negative.     Objective:   Physical Exam Vitals and nursing note reviewed.  Constitutional:      Appearance: Normal appearance. He is well-developed.  HENT:     Head: Normocephalic.     Nose: Nose normal.     Mouth/Throat:     Mouth: Mucous membranes are moist.     Pharynx: Oropharynx is clear.  Eyes:     Pupils: Pupils are equal, round, and reactive to light.  Neck:     Thyroid: No thyroid mass or thyromegaly.     Vascular: No carotid bruit or JVD.     Trachea: Phonation normal.  Cardiovascular:     Rate and Rhythm: Normal rate and regular rhythm.  Pulmonary:     Effort: Pulmonary effort is normal. No respiratory distress.     Breath sounds: Normal breath sounds.  Abdominal:     General: Bowel sounds are normal.     Palpations: Abdomen is soft.     Tenderness: There is no abdominal tenderness.  Musculoskeletal:        General: Normal range of motion.     Cervical back: Normal range of motion and neck supple.     Comments: ROM of cervial spine with pulling sensation on leaning to left. Pain on palpation along right sternocleidomastoid muscle. Grips equal bil.  Lymphadenopathy:     Cervical: No cervical adenopathy.  Skin:    General: Skin is warm and dry.  Neurological:     Mental Status: He is alert and oriented to person, place, and time.  Psychiatric:        Behavior: Behavior normal.        Thought Content: Thought content normal.        Judgment: Judgment normal.    BP 126/81   Pulse 69   Temp 97.9 F (36.6 C) (Temporal)   Resp 20   Ht _0  (1.702  m)   Wt 214 lb (97.1 kg)   SpO2 99%   BMI 33.52 kg/m   Hgba1c discussed at appointment 7.5%     Assessment & Plan:  Cameron Noble comes in today with chief complaint of Medical Management of Chronic Issues   Diagnosis and orders addressed:  1. Essential hypertension, benign Low sodium  diet - CBC with Differential/Platelet - CMP14+EGFR - lisinopril (ZESTRIL) 10 MG tablet; Take 1 tablet (10 mg total) by mouth daily.  Dispense: 90 tablet; Refill: 1  2. Mixed hyperlipidemia Low fat diet - Lipid panel - atorvastatin (LIPITOR) 10 MG tablet; Take 1 tablet (10 mg total) by mouth daily.  Dispense: 90 tablet; Refill: 1  3. Type 2 diabetes mellitus with diabetic polyneuropathy, without long-term current use of insulin (HCC) Stricter carb counting Really watch diet while on steroids because will increase blood sugars - Bayer DCA Hb A1c Waived - glipiZIDE (GLUCOTROL XL) 5 MG 24 hr tablet; Take 1 tablet (5 mg total) by mouth daily with breakfast.  Dispense: 90 tablet; Refill: 1 - metFORMIN (GLUCOPHAGE) 1000 MG tablet; Take 1 tablet (1,000 mg total) by mouth 2 (two) times daily with a meal.  Dispense: 180 tablet; Refill: 1  4. Hypokalemia Labs pending - Potassium Citrate 15 MEQ (1620 MG) TBCR; Take 2 tablets by mouth 2 (two) times daily.  Dispense: 360 tablet; Refill: 1  5. Chronic back pain, unspecified back location, unspecified back pain laterality Moist heat  rest - gabapentin (NEURONTIN) 100 MG capsule; TAKE 1 TO 2 CAPSULES BY MOUTH AT BEDTIME  Dispense: 180 capsule; Refill: 1  6. Rectal bleeding Report any blood on stools or black stools  7. BMI 33.0-33.9,adult Discussed diet and exercise for person with BMI >25 Will recheck weight in 3-6 months   8. Strain of neck muscle, initial encounter Moist heat  Rest Robaxin at night  - predniSONE (DELTASONE) 20 MG tablet; Take 2 tablets (40 mg total) by mouth daily with breakfast for 5 days. 2 po daily for 5 days  Dispense: 10  tablet; Refill: 0  9. Gastroesophageal reflux disease without esophagitis Avoid spicy foods Do not eat 2 hours prior to bedtime  - omeprazole (PRILOSEC) 40 MG capsule; Take 1 capsule (40 mg total) by mouth in the morning and at bedtime.  Dispense: 180 capsule; Refill: 1   Labs pending Health Maintenance reviewed Diet and exercise encouraged  Follow up plan: 3 months   [name], FNP

## 2020-12-19 LAB — CBC WITH DIFFERENTIAL/PLATELET
Basophils Absolute: 0.1 10*3/uL (ref 0.0–0.2)
Basos: 1 %
EOS (ABSOLUTE): 0.5 10*3/uL — ABNORMAL HIGH (ref 0.0–0.4)
Eos: 5 %
Hematocrit: 42.3 % (ref 37.5–51.0)
Hemoglobin: 14.2 g/dL (ref 13.0–17.7)
Immature Grans (Abs): 0 10*3/uL (ref 0.0–0.1)
Immature Granulocytes: 0 %
Lymphocytes Absolute: 2.8 10*3/uL (ref 0.7–3.1)
Lymphs: 29 %
MCH: 28 pg (ref 26.6–33.0)
MCHC: 33.6 g/dL (ref 31.5–35.7)
MCV: 83 fL (ref 79–97)
Monocytes Absolute: 0.7 10*3/uL (ref 0.1–0.9)
Monocytes: 8 %
Neutrophils Absolute: 5.4 10*3/uL (ref 1.4–7.0)
Neutrophils: 57 %
Platelets: 314 10*3/uL (ref 150–450)
RBC: 5.07 x10E6/uL (ref 4.14–5.80)
RDW: 14 % (ref 11.6–15.4)
WBC: 9.4 10*3/uL (ref 3.4–10.8)

## 2020-12-19 LAB — CMP14+EGFR
ALT: 16 IU/L (ref 0–44)
AST: 16 IU/L (ref 0–40)
Albumin/Globulin Ratio: 1.4 (ref 1.2–2.2)
Albumin: 4.2 g/dL (ref 3.8–4.8)
Alkaline Phosphatase: 130 IU/L — ABNORMAL HIGH (ref 44–121)
BUN/Creatinine Ratio: 9 — ABNORMAL LOW (ref 10–24)
BUN: 12 mg/dL (ref 8–27)
Bilirubin Total: 0.5 mg/dL (ref 0.0–1.2)
CO2: 23 mmol/L (ref 20–29)
Calcium: 10.1 mg/dL (ref 8.6–10.2)
Chloride: 104 mmol/L (ref 96–106)
Creatinine, Ser: 1.41 mg/dL — ABNORMAL HIGH (ref 0.76–1.27)
Globulin, Total: 3.1 g/dL (ref 1.5–4.5)
Glucose: 121 mg/dL — ABNORMAL HIGH (ref 70–99)
Potassium: 4.9 mmol/L (ref 3.5–5.2)
Sodium: 140 mmol/L (ref 134–144)
Total Protein: 7.3 g/dL (ref 6.0–8.5)
eGFR: 56 mL/min/{1.73_m2} — ABNORMAL LOW (ref >59)

## 2020-12-19 LAB — LIPID PANEL
Chol/HDL Ratio: 3.6 ratio (ref 0.0–5.0)
Cholesterol, Total: 119 mg/dL (ref 100–199)
HDL: 33 mg/dL — ABNORMAL LOW (ref >39)
LDL Chol Calc (NIH): 72 mg/dL (ref 0–99)
Triglycerides: 68 mg/dL (ref 0–149)
VLDL Cholesterol Cal: 14 mg/dL (ref 5–40)

## 2021-02-02 ENCOUNTER — Encounter: Payer: Self-pay | Admitting: Nurse Practitioner

## 2021-02-02 ENCOUNTER — Ambulatory Visit: Payer: BC Managed Care – PPO | Admitting: Nurse Practitioner

## 2021-02-02 VITALS — BP 135/87 | HR 78 | Temp 97.7°F | Resp 20 | Ht 67.0 in | Wt 206.0 lb

## 2021-02-02 DIAGNOSIS — H5789 Other specified disorders of eye and adnexa: Secondary | ICD-10-CM | POA: Diagnosis not present

## 2021-02-02 MED ORDER — POLYMYXIN B-TRIMETHOPRIM 10000-0.1 UNIT/ML-% OP SOLN: 1.0000 [drp] | OPHTHALMIC | 0 refills | Status: DC

## 2021-02-02 NOTE — Progress Notes (Signed)
Subjective:    Patient ID: Cameron Noble, male    DOB: 04-13-1960, 60 y.o.   MRN: [id number]   Chief Complaint: Eye is red (Right eye/)   Conjunctivitis  The current episode started more than 2 weeks ago. The onset was gradual. The problem occurs continuously. The problem has been gradually worsening. The problem is severe. The symptoms are relieved by one or more OTC medications. Nothing aggravates the symptoms. Associated symptoms include eye itching, eye discharge and eye redness. Pertinent negatives include no decreased vision, no double vision, no rhinorrhea, no sore throat and no eye pain. The eye pain is mild. The right eye is affected. The eye pain is not associated with movement. The eyelid exhibits redness. He has been Eating and drinking normally. Urine output has been normal. There were no sick contacts. He has received no recent medical care.      Review of Systems  HENT:  Negative for rhinorrhea and sore throat.   Eyes:  Positive for discharge, redness and itching. Negative for double vision and pain.      Objective:   Physical Exam Vitals reviewed.  Constitutional:      Appearance: Normal appearance.  HENT:     Head: Normocephalic.     Right Ear: Tympanic membrane normal.     Left Ear: Tympanic membrane normal.  Eyes:     General: Scleral icterus (only on medial side) present.        Right eye: No discharge.     Extraocular Movements: Extraocular movements intact.     Pupils: Pupils are equal, round, and reactive to light.  Cardiovascular:     Rate and Rhythm: Normal rate and regular rhythm.     Heart sounds: Normal heart sounds.  Pulmonary:     Breath sounds: Normal breath sounds.  Skin:    General: Skin is warm.  Neurological:     General: No focal deficit present.     Mental Status: He is alert.  Psychiatric:        Mood and Affect: Mood normal.        Behavior: Behavior normal.    BP 135/87    Pulse 78    Temp 97.7 F (36.5 C) (Temporal)    Resp 20     Ht 5\' 7"  (1.702 m)    Wt 206 lb (93.4 kg)    SpO2 100%    BMI 32.26 kg/m        Assessment & Plan:   Sadat Sliwa in today with chief complaint of Eye is red (Right eye/)   1. Red eye No rubbing of eye Cool compresses If not improving in 48 hours need s to see eye doctor for more extensive exam Meds ordered this encounter  Medications   trimethoprim-polymyxin b (POLYTRIM) ophthalmic solution    Sig: Place 1 drop into both eyes every 4 (four) hours.    Dispense:  10 mL    Refill:  0    Order Specific Question:   Supervising Provider    Answer:   Caryl Pina A [[id number]]       The above assessment and management plan was discussed with the patient. The patient verbalized understanding of and has agreed to the management plan. Patient is aware to call the clinic if symptoms persist or worsen. Patient is aware when to return to the clinic for a follow-up visit. Patient educated on when it is appropriate to go to the emergency department.   [name], FNP

## 2021-02-02 NOTE — Patient Instructions (Signed)
Bacterial Conjunctivitis, Adult Bacterial conjunctivitis is an infection of your conjunctiva. This is the clear membrane that covers the white part of your eye and the inner part of your eyelid. This infection can make your eye: Red or pink. Itchy or irritated. This condition spreads easily from person to person (is contagious) and from one eye to the other eye. What are the causes? This condition is caused by germs (bacteria). You may get the infection if you come into close contact with: A person who has the infection. Items that have germs on them (are contaminated), such as face towels, contact lens solution, or eye makeup. What increases the risk? You are more likely to get this condition if: You have contact with people who have the infection. You wear contact lenses. You have a sinus infection. You have had a recent eye injury or surgery. You have a weak body defense system (immune system). You have dry eyes. What are the signs or symptoms?  Thick, yellowish discharge from the eye. Tearing or watery eyes. Itchy eyes. Burning feeling in your eyes. Eye redness. Swollen eyelids. Blurred vision. How is this treated?  Antibiotic eye drops or ointment. Antibiotic medicine taken by mouth. This is used for infections that do not get better with drops or ointment or that last more than 10 days. Cool, wet cloths placed on the eyes. Artificial tears used 2-6 times a day. Follow these instructions at home: Medicines Take or apply your antibiotic medicine as told by your doctor. Do not stop using it even if you start to feel better. Take or apply over-the-counter and prescription medicines only as told by your doctor. Do not touch your eyelid with the eye-drop bottle or the ointment tube. Managing discomfort Wipe any fluid from your eye with a warm, wet washcloth or a cotton ball. Place a clean, cool, wet cloth on your eye. Do this for 10-20 minutes, 3-4 times a day. General  instructions Do not wear contacts until the infection is gone. Wear glasses until your doctor says it is okay to wear contacts again. Do not wear eye makeup until the infection is gone. Throw away old eye makeup. Change or wash your pillowcase every day. Do not share towels or washcloths. Wash your hands often with soap and water for at least 20 seconds and especially before touching your face or eyes. Use paper towels to dry your hands. Do not touch or rub your eyes. Do not drive or use heavy machinery if your vision is blurred. Contact a doctor if: You have a fever. You do not get better after 10 days. Get help right away if: You have a fever and your symptoms get worse all of a sudden. You have very bad pain when you move your eye. Your face: Hurts. Is red. Is swollen. You have sudden loss of vision. Summary Bacterial conjunctivitis is an infection of your conjunctiva. This infection spreads easily from person to person. Wash your hands often with soap and water for at least 20 seconds and especially before touching your face or eyes. Use paper towels to dry your hands. Take or apply your antibiotic medicine as told by your doctor. Contact a doctor if you have a fever or you do not get better after 10 days. This information is not intended to replace advice given to you by your health care provider. Make sure you discuss any questions you have with your health care provider. Document Revised: 04/09/2020 Document Reviewed: 04/09/2020 Elsevier Patient Education    2022 Elsevier Inc.  

## 2021-03-09 ENCOUNTER — Other Ambulatory Visit: Payer: Self-pay | Admitting: Nurse Practitioner

## 2021-03-09 DIAGNOSIS — I10 Essential (primary) hypertension: Secondary | ICD-10-CM

## 2021-03-09 DIAGNOSIS — E1142 Type 2 diabetes mellitus with diabetic polyneuropathy: Secondary | ICD-10-CM

## 2021-03-20 ENCOUNTER — Ambulatory Visit: Payer: BC Managed Care – PPO | Admitting: Nurse Practitioner

## 2021-03-20 ENCOUNTER — Encounter: Payer: Self-pay | Admitting: Nurse Practitioner

## 2021-03-20 VITALS — BP 137/85 | HR 68 | Temp 97.6°F | Ht 67.0 in | Wt 201.4 lb

## 2021-03-20 DIAGNOSIS — Z6829 Body mass index (BMI) 29.0-29.9, adult: Secondary | ICD-10-CM

## 2021-03-20 DIAGNOSIS — E782 Mixed hyperlipidemia: Secondary | ICD-10-CM | POA: Diagnosis not present

## 2021-03-20 DIAGNOSIS — I10 Essential (primary) hypertension: Secondary | ICD-10-CM | POA: Diagnosis not present

## 2021-03-20 DIAGNOSIS — E876 Hypokalemia: Secondary | ICD-10-CM

## 2021-03-20 DIAGNOSIS — M549 Dorsalgia, unspecified: Secondary | ICD-10-CM

## 2021-03-20 DIAGNOSIS — K219 Gastro-esophageal reflux disease without esophagitis: Secondary | ICD-10-CM

## 2021-03-20 DIAGNOSIS — E1142 Type 2 diabetes mellitus with diabetic polyneuropathy: Secondary | ICD-10-CM

## 2021-03-20 DIAGNOSIS — G8929 Other chronic pain: Secondary | ICD-10-CM

## 2021-03-20 LAB — BAYER DCA HB A1C WAIVED: HB A1C (BAYER DCA - WAIVED): 6.2 % — ABNORMAL HIGH (ref 4.8–5.6)

## 2021-03-20 MED ORDER — POTASSIUM CITRATE ER 15 MEQ (1620 MG) PO TBCR: 2.0000 | EXTENDED_RELEASE_TABLET | Freq: Two times a day (BID) | ORAL | 1 refills | Status: DC

## 2021-03-20 MED ORDER — LISINOPRIL 10 MG PO TABS: 10.0000 mg | ORAL_TABLET | Freq: Every day | ORAL | 1 refills | Status: DC

## 2021-03-20 MED ORDER — ATORVASTATIN CALCIUM 10 MG PO TABS: 10.0000 mg | ORAL_TABLET | Freq: Every day | ORAL | 1 refills | Status: DC

## 2021-03-20 MED ORDER — OMEPRAZOLE 40 MG PO CPDR: 40.0000 mg | DELAYED_RELEASE_CAPSULE | Freq: Two times a day (BID) | ORAL | 1 refills | Status: DC

## 2021-03-20 MED ORDER — GLIPIZIDE ER 5 MG PO TB24: 5.0000 mg | ORAL_TABLET | Freq: Every day | ORAL | 1 refills | Status: DC

## 2021-03-20 MED ORDER — GABAPENTIN 100 MG PO CAPS: ORAL_CAPSULE | ORAL | 1 refills | Status: DC

## 2021-03-20 MED ORDER — METFORMIN HCL 1000 MG PO TABS: 1000.0000 mg | ORAL_TABLET | Freq: Two times a day (BID) | ORAL | 1 refills | Status: DC

## 2021-03-20 NOTE — Patient Instructions (Signed)

## 2021-03-20 NOTE — Progress Notes (Signed)
Subjective:    Patient ID: Cameron Noble, male    DOB: 16-Jan-1960, 61 y.o.   MRN: [id number]   Chief Complaint: medical management of chronic issues     HPI:  Cameron Noble is a 61 y.o. who identifies as a male who was assigned male at birth.   Social history: Lives with: his wife Work history: retired   Scientist, forensic in today for follow up of the following chronic medical issues:  1. Essential hypertension, benign No c/o chest pain, sob or headache. Doe snot check bloodpressure at home. BP Readings from Last 3 Encounters:  02/02/21 135/87  12/18/20 126/81  09/16/20 130/89     2. Mixed hyperlipidemia Does try to watch diet and stays very active. No dedicated exercise. Lab Results  Component Value Date   CHOL 119 12/18/2020   HDL 33 (L) 12/18/2020   LDLCALC 72 12/18/2020   TRIG 68 12/18/2020   CHOLHDL 3.6 12/18/2020     3. Hypokalemia No c/o of muscle cramping Lab Results  Component Value Date   K 4.9 12/18/2020     4. Type 2 diabetes mellitus with diabetic polyneuropathy, without long-term current use of insulin ([facility]) Fasting blood sugars are running around 140-150. He denies any low blood sugars. He has been keeping hos 65 year old grandson which has been keeping him very active. Lab Results  Component Value Date   HGBA1C 7.5 (H) 12/18/2020     5. Gastroesophageal reflux disease without esophagitis Is on omeprazole daily and s doing well  6. Chronic back pain, unspecified back location, unspecified back pain laterality He is on neurontin BID- says it really helps but he still occasionally has pain. Rates around 2/10. Doe snit interrupt hs daily activities  7. BMI 29.0-29.9,adult No recent weight changes   New complaints: None today  No Known Allergies Outpatient Encounter Medications as of 03/20/2021  Medication Sig   ammonium lactate (AMLACTIN) 12 % cream APPLY TO DRY SKIN ON BOTTOM OF FEET 2 TIMES A DAY   aspirin 81 MG tablet Take 81 mg by mouth  daily.   atorvastatin (LIPITOR) 10 MG tablet Take 1 tablet (10 mg total) by mouth daily.   azelastine (OPTIVAR) 0.05 % ophthalmic solution SMARTSIG:1 Drop(s) In Eye(s) Every 12 Hours   Cholecalciferol (VITAMIN D-3 PO) Take 1 tablet by mouth daily.   ciclopirox (PENLAC) 8 % solution Apply topically at bedtime. Apply over nail and surrounding skin. Apply daily over previous coat. After seven (7) days, may remove with alcohol and continue cycle.   fish oil-omega-3 fatty acids 1000 MG capsule Take 2 g by mouth daily.   gabapentin (NEURONTIN) 100 MG capsule TAKE 1 TO 2 CAPSULES BY MOUTH AT BEDTIME   glipiZIDE (GLUCOTROL XL) 5 MG 24 hr tablet TAKE 1 TABLET DAILY WITH BREAKFAST   lisinopril (ZESTRIL) 10 MG tablet TAKE 1 TABLET DAILY   meloxicam (MOBIC) 7.5 MG tablet TAKE 1 TABLET DAILY   metFORMIN (GLUCOPHAGE) 1000 MG tablet Take 1 tablet (1,000 mg total) by mouth 2 (two) times daily with a meal.   methocarbamol (ROBAXIN) 500 MG tablet Take 1 tablet (500 mg total) by mouth 4 (four) times daily.   omeprazole (PRILOSEC) 40 MG capsule Take 1 capsule (40 mg total) by mouth in the morning and at bedtime.   Potassium Citrate 15 MEQ (1620 MG) TBCR Take 2 tablets by mouth 2 (two) times daily.   trimethoprim-polymyxin b (POLYTRIM) ophthalmic solution Place 1 drop into both eyes every 4 (four) hours.  No facility-administered encounter medications on file as of 03/20/2021.    Past Surgical History:  Procedure Laterality Date   BACK SURGERY  03/2002   Dr. Trenton Gammon    CYSTOSCOPY WITH STENT PLACEMENT Left 01/27/2015   Procedure: CYSTOSCOPY, RETROGRADE, WITH LEFT STENT PLACEMENT;  Surgeon: Irine Seal, MD;  Location: [facility];  Service: Urology;  Laterality: Left;  I have 2 cases at [facility] and a foley to place at [facility].  I would guess it will be 830-9 before I get up to [facility].    DDD C-Spine Repair     KNEE ARTHROSCOPY Right     Family History  Problem Relation Age of Onset   Diabetes Mother    Hypertension Mother     Colon cancer Neg Hx    Esophageal cancer Neg Hx    Rectal cancer Neg Hx    Stomach cancer Neg Hx       Controlled substance contract: n/a     Review of Systems  Constitutional:  Negative for diaphoresis.  Eyes:  Negative for pain.  Respiratory:  Negative for shortness of breath.   Cardiovascular:  Negative for chest pain, palpitations and leg swelling.  Gastrointestinal:  Negative for abdominal pain.  Endocrine: Negative for polydipsia.  Skin:  Negative for rash.  Neurological:  Negative for dizziness, weakness and headaches.  Hematological:  Does not bruise/bleed easily.  All other systems reviewed and are negative.     Objective:   Physical Exam Vitals and nursing note reviewed.  Constitutional:      Appearance: Normal appearance. He is well-developed.  HENT:     Head: Normocephalic.     Nose: Nose normal.     Mouth/Throat:     Mouth: Mucous membranes are moist.     Pharynx: Oropharynx is clear.  Eyes:     Pupils: Pupils are equal, round, and reactive to light.  Neck:     Thyroid: No thyroid mass or thyromegaly.     Vascular: No carotid bruit or JVD.     Trachea: Phonation normal.  Cardiovascular:     Rate and Rhythm: Normal rate and regular rhythm.  Pulmonary:     Effort: Pulmonary effort is normal. No respiratory distress.     Breath sounds: Normal breath sounds.  Abdominal:     General: Bowel sounds are normal.     Palpations: Abdomen is soft.     Tenderness: There is no abdominal tenderness.  Musculoskeletal:        General: Normal range of motion.     Cervical back: Normal range of motion and neck supple.  Lymphadenopathy:     Cervical: No cervical adenopathy.  Skin:    General: Skin is warm and dry.  Neurological:     Mental Status: He is alert and oriented to person, place, and time.  Psychiatric:        Behavior: Behavior normal.        Thought Content: Thought content normal.        Judgment: Judgment normal.    Hgba1c discussed at  appointment 6.2% BP 137/85    Pulse 68    Temp 97.6 F (36.4 C) (Temporal)    Ht _0  (1.702 m)    Wt 201 lb 6.4 oz (91.4 kg)    SpO2 99%    BMI 31.54 kg/m        Assessment & Plan:  Michah Minton comes in today with chief complaint of Hypertension, Hyperlipidemia, Medication Management, and Diabetes  Diagnosis and orders addressed:  1. Essential hypertension, benign Low sodium diet - CMP14+EGFR - Lipid panel - CBC with Differential/Platelet - Bayer DCA Hb A1c Waived - lisinopril (ZESTRIL) 10 MG tablet; Take 1 tablet (10 mg total) by mouth daily.  Dispense: 90 tablet; Refill: 1  2. Mixed hyperlipidemia Ow fat diet - CMP14+EGFR - Lipid panel - CBC with Differential/Platelet - Bayer DCA Hb A1c Waived - atorvastatin (LIPITOR) 10 MG tablet; Take 1 tablet (10 mg total) by mouth daily.  Dispense: 90 tablet; Refill: 1  3. Hypokalemia Labs pending - CMP14+EGFR - Lipid panel - CBC with Differential/Platelet - Bayer DCA Hb A1c Waived - Potassium Citrate 15 MEQ (1620 MG) TBCR; Take 2 tablets by mouth 2 (two) times daily.  Dispense: 360 tablet; Refill: 1  4. Type 2 diabetes mellitus with diabetic polyneuropathy, without long-term current use of insulin ([facility]) Continue to watch carbs in diet - CMP14+EGFR - Lipid panel - CBC with Differential/Platelet - Bayer DCA Hb A1c Waived - glipiZIDE (GLUCOTROL XL) 5 MG 24 hr tablet; Take 1 tablet (5 mg total) by mouth daily with breakfast.  Dispense: 90 tablet; Refill: 1 - metFORMIN (GLUCOPHAGE) 1000 MG tablet; Take 1 tablet (1,000 mg total) by mouth 2 (two) times daily with a meal.  Dispense: 180 tablet; Refill: 1  5. Gastroesophageal reflux disease without esophagitis Avoid spicy foods Do not eat 2 hours prior to bedtime - omeprazole (PRILOSEC) 40 MG capsule; Take 1 capsule (40 mg total) by mouth in the morning and at bedtime.  Dispense: 180 capsule; Refill: 1  6. Chronic back pain, unspecified back location, unspecified back pain  laterality - gabapentin (NEURONTIN) 100 MG capsule; TAKE 1 TO 2 CAPSULES BY MOUTH AT BEDTIME  Dispense: 180 capsule; Refill: 1  7. BMI 29.0-29.9,adult Discussed diet and exercise for person with BMI >25 Will recheck weight in 3-6 months    Labs pending Health Maintenance reviewed Diet and exercise encouraged  Follow up plan: 3 months   [name], FNP

## 2021-03-21 LAB — CBC WITH DIFFERENTIAL/PLATELET
Basophils Absolute: 0 10*3/uL (ref 0.0–0.2)
Basos: 0 %
EOS (ABSOLUTE): 0.4 10*3/uL (ref 0.0–0.4)
Eos: 3 %
Hematocrit: 44 % (ref 37.5–51.0)
Hemoglobin: 14.2 g/dL (ref 13.0–17.7)
Immature Grans (Abs): 0 10*3/uL (ref 0.0–0.1)
Immature Granulocytes: 0 %
Lymphocytes Absolute: 2.9 10*3/uL (ref 0.7–3.1)
Lymphs: 24 %
MCH: 28 pg (ref 26.6–33.0)
MCHC: 32.3 g/dL (ref 31.5–35.7)
MCV: 87 fL (ref 79–97)
Monocytes Absolute: 1 10*3/uL — ABNORMAL HIGH (ref 0.1–0.9)
Monocytes: 8 %
Neutrophils Absolute: 8.1 10*3/uL — ABNORMAL HIGH (ref 1.4–7.0)
Neutrophils: 65 %
Platelets: 339 10*3/uL (ref 150–450)
RBC: 5.07 x10E6/uL (ref 4.14–5.80)
RDW: 13.1 % (ref 11.6–15.4)
WBC: 12.4 10*3/uL — ABNORMAL HIGH (ref 3.4–10.8)

## 2021-03-21 LAB — CMP14+EGFR
ALT: 15 IU/L (ref 0–44)
AST: 16 IU/L (ref 0–40)
Albumin/Globulin Ratio: 1.4 (ref 1.2–2.2)
Albumin: 4.3 g/dL (ref 3.8–4.8)
Alkaline Phosphatase: 114 IU/L (ref 44–121)
BUN/Creatinine Ratio: 7 — ABNORMAL LOW (ref 10–24)
BUN: 8 mg/dL (ref 8–27)
Bilirubin Total: 0.2 mg/dL (ref 0.0–1.2)
CO2: 25 mmol/L (ref 20–29)
Calcium: 9.8 mg/dL (ref 8.6–10.2)
Chloride: 106 mmol/L (ref 96–106)
Creatinine, Ser: 1.09 mg/dL (ref 0.76–1.27)
Globulin, Total: 3 g/dL (ref 1.5–4.5)
Glucose: 121 mg/dL — ABNORMAL HIGH (ref 70–99)
Potassium: 4.4 mmol/L (ref 3.5–5.2)
Sodium: 144 mmol/L (ref 134–144)
Total Protein: 7.3 g/dL (ref 6.0–8.5)
eGFR: 76 mL/min/{1.73_m2} (ref >59)

## 2021-03-21 LAB — LIPID PANEL
Chol/HDL Ratio: 3.2 ratio (ref 0.0–5.0)
Cholesterol, Total: 106 mg/dL (ref 100–199)
HDL: 33 mg/dL — ABNORMAL LOW (ref >39)
LDL Chol Calc (NIH): 43 mg/dL (ref 0–99)
Triglycerides: 181 mg/dL — ABNORMAL HIGH (ref 0–149)
VLDL Cholesterol Cal: 30 mg/dL (ref 5–40)

## 2021-03-27 ENCOUNTER — Telehealth: Payer: Self-pay | Admitting: Nurse Practitioner

## 2021-03-27 NOTE — Telephone Encounter (Signed)
Patient aware and verbalized understanding to take omeprazole as prescribed  ?

## 2021-03-27 NOTE — Telephone Encounter (Signed)
Patient wants to speak with nurse. Has a few questions about his medications.  ?

## 2021-06-08 ENCOUNTER — Other Ambulatory Visit: Payer: Self-pay | Admitting: Nurse Practitioner

## 2021-06-08 DIAGNOSIS — E1142 Type 2 diabetes mellitus with diabetic polyneuropathy: Secondary | ICD-10-CM

## 2021-06-08 DIAGNOSIS — I10 Essential (primary) hypertension: Secondary | ICD-10-CM

## 2021-06-25 ENCOUNTER — Ambulatory Visit: Payer: BC Managed Care – PPO | Admitting: Nurse Practitioner

## 2021-06-25 ENCOUNTER — Encounter: Payer: Self-pay | Admitting: Nurse Practitioner

## 2021-06-25 VITALS — BP 109/71 | HR 75 | Temp 97.7°F | Resp 20 | Ht 67.0 in | Wt 201.0 lb

## 2021-06-25 DIAGNOSIS — E1142 Type 2 diabetes mellitus with diabetic polyneuropathy: Secondary | ICD-10-CM | POA: Diagnosis not present

## 2021-06-25 DIAGNOSIS — E782 Mixed hyperlipidemia: Secondary | ICD-10-CM

## 2021-06-25 DIAGNOSIS — K219 Gastro-esophageal reflux disease without esophagitis: Secondary | ICD-10-CM

## 2021-06-25 DIAGNOSIS — I10 Essential (primary) hypertension: Secondary | ICD-10-CM

## 2021-06-25 DIAGNOSIS — G8929 Other chronic pain: Secondary | ICD-10-CM

## 2021-06-25 DIAGNOSIS — E876 Hypokalemia: Secondary | ICD-10-CM

## 2021-06-25 DIAGNOSIS — M549 Dorsalgia, unspecified: Secondary | ICD-10-CM

## 2021-06-25 DIAGNOSIS — Z6829 Body mass index (BMI) 29.0-29.9, adult: Secondary | ICD-10-CM

## 2021-06-25 LAB — BAYER DCA HB A1C WAIVED: HB A1C (BAYER DCA - WAIVED): 5.7 % — ABNORMAL HIGH (ref 4.8–5.6)

## 2021-06-25 MED ORDER — LISINOPRIL 10 MG PO TABS: 10.0000 mg | ORAL_TABLET | Freq: Every day | ORAL | 1 refills | Status: DC

## 2021-06-25 MED ORDER — OMEPRAZOLE 40 MG PO CPDR: 40.0000 mg | DELAYED_RELEASE_CAPSULE | Freq: Two times a day (BID) | ORAL | 1 refills | Status: DC

## 2021-06-25 MED ORDER — METFORMIN HCL 1000 MG PO TABS: 1000.0000 mg | ORAL_TABLET | Freq: Two times a day (BID) | ORAL | 1 refills | Status: DC

## 2021-06-25 MED ORDER — POTASSIUM CITRATE ER 15 MEQ (1620 MG) PO TBCR: 2.0000 | EXTENDED_RELEASE_TABLET | Freq: Two times a day (BID) | ORAL | 1 refills | Status: DC

## 2021-06-25 MED ORDER — GABAPENTIN 100 MG PO CAPS: ORAL_CAPSULE | ORAL | 1 refills | Status: DC

## 2021-06-25 MED ORDER — ATORVASTATIN CALCIUM 10 MG PO TABS
10.0000 mg | ORAL_TABLET | Freq: Every day | ORAL | 1 refills | Status: DC
Start: 2021-06-25 — End: 2021-08-05

## 2021-06-25 MED ORDER — GLIPIZIDE ER 5 MG PO TB24: 5.0000 mg | ORAL_TABLET | Freq: Every day | ORAL | 1 refills | Status: DC

## 2021-06-25 NOTE — Progress Notes (Signed)
Subjective:    Patient ID: Cameron Noble, male    DOB: 12-08-1960, 61 y.o.   MRN: [id number]   Chief Complaint: medical management of chronic issues     HPI:  Cameron Noble is a 61 y.o. who identifies as a male who was assigned male at birth.   Social history: Lives with: by hisself- his children check on him daily Work history: retired   Scientist, forensic in today for follow up of the following chronic medical issues:  1. Essential hypertension, benign No c/o chest pain, sob or headache, does not check blood pressure at home. BP Readings from Last 3 Encounters:  03/20/21 137/85  02/02/21 135/87  12/18/20 126/81     2. Mixed hyperlipidemia Eats whatever his family brings him. Dos no exercise. Lab Results  Component Value Date   CHOL 106 03/20/2021   HDL 33 (L) 03/20/2021   LDLCALC 43 03/20/2021   TRIG 181 (H) 03/20/2021   CHOLHDL 3.2 03/20/2021     3. Gastroesophageal reflux disease without esophagitis Is on omperazole daily and is doing well.  4. Type 2 diabetes mellitus with diabetic polyneuropathy, without long-term current use of insulin ([place]) He has not been checking his blood sugars. He denies any symptoms of low blood sugars. Lab Results  Component Value Date   HGBA1C 6.2 (H) 03/20/2021     5. Hypokalemia No c/o muscle cramps Lab Results  Component Value Date   K 4.4 03/20/2021     6. Chronic back pain, unspecified back location, unspecified back pain laterality He has back pain daily and just takes tylenol for it which helps. He tries to move around when hurting and that seems to help.  7. BMI 29.0-29.9,adult No recent weight changes Wt Readings from Last 3 Encounters:  06/25/21 201 lb (91.2 kg)  03/20/21 201 lb 6.4 oz (91.4 kg)  02/02/21 206 lb (93.4 kg)   BMI Readings from Last 3 Encounters:  06/25/21 31.48 kg/m  03/20/21 31.54 kg/m  02/02/21 32.26 kg/m     New complaints: None today  No Known Allergies Outpatient Encounter Medications  as of 06/25/2021  Medication Sig   aspirin 81 MG tablet Take 81 mg by mouth daily.   atorvastatin (LIPITOR) 10 MG tablet Take 1 tablet (10 mg total) by mouth daily.   azelastine (OPTIVAR) 0.05 % ophthalmic solution SMARTSIG:1 Drop(s) In Eye(s) Every 12 Hours   Cholecalciferol (VITAMIN D-3 PO) Take 1 tablet by mouth daily.   ciclopirox (PENLAC) 8 % solution Apply topically at bedtime. Apply over nail and surrounding skin. Apply daily over previous coat. After seven (7) days, may remove with alcohol and continue cycle.   fish oil-omega-3 fatty acids 1000 MG capsule Take 2 g by mouth daily.   gabapentin (NEURONTIN) 100 MG capsule TAKE 1 TO 2 CAPSULES BY MOUTH AT BEDTIME   glipiZIDE (GLUCOTROL XL) 5 MG 24 hr tablet TAKE 1 TABLET DAILY WITH BREAKFAST   lisinopril (ZESTRIL) 10 MG tablet TAKE 1 TABLET DAILY   meloxicam (MOBIC) 7.5 MG tablet TAKE 1 TABLET DAILY   metFORMIN (GLUCOPHAGE) 1000 MG tablet Take 1 tablet (1,000 mg total) by mouth 2 (two) times daily with a meal.   methocarbamol (ROBAXIN) 500 MG tablet Take 1 tablet (500 mg total) by mouth 4 (four) times daily.   omeprazole (PRILOSEC) 40 MG capsule Take 1 capsule (40 mg total) by mouth in the morning and at bedtime.   Potassium Citrate 15 MEQ (1620 MG) TBCR Take 2 tablets by mouth 2 (  two) times daily.   trimethoprim-polymyxin b (POLYTRIM) ophthalmic solution Place 1 drop into both eyes every 4 (four) hours.   No facility-administered encounter medications on file as of 06/25/2021.    Past Surgical History:  Procedure Laterality Date   BACK SURGERY  03/2002   Dr. Trenton Gammon    CYSTOSCOPY WITH STENT PLACEMENT Left 01/27/2015   Procedure: CYSTOSCOPY, RETROGRADE, WITH LEFT STENT PLACEMENT;  Surgeon: Irine Seal, MD;  Location: [facility];  Service: Urology;  Laterality: Left;  I have 2 cases at [facility] and a foley to place at [facility].  I would guess it will be 830-9 before I get up to [facility].    DDD C-Spine Repair     KNEE ARTHROSCOPY Right     Family History   Problem Relation Age of Onset   Diabetes Mother    Hypertension Mother    Colon cancer Neg Hx    Esophageal cancer Neg Hx    Rectal cancer Neg Hx    Stomach cancer Neg Hx       Controlled substance contract: n/a     Review of Systems  Constitutional:  Negative for diaphoresis.  Eyes:  Negative for pain.  Respiratory:  Negative for shortness of breath.   Cardiovascular:  Negative for chest pain, palpitations and leg swelling.  Gastrointestinal:  Negative for abdominal pain.  Endocrine: Negative for polydipsia.  Skin:  Negative for rash.  Neurological:  Negative for dizziness, weakness and headaches.  Hematological:  Does not bruise/bleed easily.  All other systems reviewed and are negative.      Objective:   Physical Exam Vitals and nursing note reviewed.  Constitutional:      Appearance: Normal appearance. He is well-developed.  HENT:     Head: Normocephalic.     Nose: Nose normal.     Mouth/Throat:     Mouth: Mucous membranes are moist.     Pharynx: Oropharynx is clear.  Eyes:     Pupils: Pupils are equal, round, and reactive to light.  Neck:     Thyroid: No thyroid mass or thyromegaly.     Vascular: No carotid bruit or JVD.     Trachea: Phonation normal.  Cardiovascular:     Rate and Rhythm: Normal rate and regular rhythm.  Pulmonary:     Effort: Pulmonary effort is normal. No respiratory distress.     Breath sounds: Normal breath sounds.  Abdominal:     General: Bowel sounds are normal.     Palpations: Abdomen is soft.     Tenderness: There is no abdominal tenderness.  Musculoskeletal:        General: Normal range of motion.     Cervical back: Normal range of motion and neck supple.  Lymphadenopathy:     Cervical: No cervical adenopathy.  Skin:    General: Skin is warm and dry.  Neurological:     Mental Status: He is alert and oriented to person, place, and time.  Psychiatric:        Behavior: Behavior normal.        Thought Content: Thought  content normal.        Judgment: Judgment normal.     BP 109/71   Pulse 75   Temp 97.7 F (36.5 C) (Temporal)   Resp 20   Ht 5' 7"  (1.702 m)   Wt 201 lb (91.2 kg)   SpO2 98%   BMI 31.48 kg/m   HGBA1c 5.7%      Assessment & Plan:  Cameron Noble comes in today with chief complaint of Medical Management of Chronic Issues   Diagnosis and orders addressed:  1. Essential hypertension, benign Low sodium diet - CBC with Differential/Platelet - CMP14+EGFR - lisinopril (ZESTRIL) 10 MG tablet; Take 1 tablet (10 mg total) by mouth daily.  Dispense: 90 tablet; Refill: 1  2. Mixed hyperlipidemia Low fat diet - Lipid panel - atorvastatin (LIPITOR) 10 MG tablet; Take 1 tablet (10 mg total) by mouth daily.  Dispense: 90 tablet; Refill: 1  3. Gastroesophageal reflux disease without esophagitis Avoid spicy foods Do not eat 2 hours prior to bedtime - omeprazole (PRILOSEC) 40 MG capsule; Take 1 capsule (40 mg total) by mouth in the morning and at bedtime.  Dispense: 180 capsule; Refill: 1  4. Type 2 diabetes mellitus with diabetic polyneuropathy, without long-term current use of insulin (HCC) Continue to watch carbs in diet - Bayer DCA Hb A1c Waived - glipiZIDE (GLUCOTROL XL) 5 MG 24 hr tablet; Take 1 tablet (5 mg total) by mouth daily with breakfast.  Dispense: 90 tablet; Refill: 1 - metFORMIN (GLUCOPHAGE) 1000 MG tablet; Take 1 tablet (1,000 mg total) by mouth 2 (two) times daily with a meal.  Dispense: 180 tablet; Refill: 1  5. Hypokalemia Labs oending - Potassium Citrate 15 MEQ (1620 MG) TBCR; Take 2 tablets by mouth 2 (two) times daily.  Dispense: 360 tablet; Refill: 1  6. Chronic back pain, unspecified back location, unspecified back pain laterality - gabapentin (NEURONTIN) 100 MG capsule; TAKE 1 TO 2 CAPSULES BY MOUTH AT BEDTIME  Dispense: 180 capsule; Refill: 1  7. BMI 29.0-29.9,adult Discussed diet and exercise for person with BMI >25 Will recheck weight in 3-6  months    Labs pending Health Maintenance reviewed Diet and exercise encouraged  Follow up plan: 6 months   [name], FNP

## 2021-06-26 LAB — CMP14+EGFR
ALT: 19 IU/L (ref 0–44)
AST: 21 IU/L (ref 0–40)
Albumin/Globulin Ratio: 1.6 (ref 1.2–2.2)
Albumin: 4.4 g/dL (ref 3.8–4.8)
Alkaline Phosphatase: 125 IU/L — ABNORMAL HIGH (ref 44–121)
BUN/Creatinine Ratio: 8 — ABNORMAL LOW (ref 10–24)
BUN: 10 mg/dL (ref 8–27)
Bilirubin Total: 0.4 mg/dL (ref 0.0–1.2)
CO2: 22 mmol/L (ref 20–29)
Calcium: 10.2 mg/dL (ref 8.6–10.2)
Chloride: 101 mmol/L (ref 96–106)
Creatinine, Ser: 1.23 mg/dL (ref 0.76–1.27)
Globulin, Total: 2.8 g/dL (ref 1.5–4.5)
Glucose: 106 mg/dL — ABNORMAL HIGH (ref 70–99)
Potassium: 4.5 mmol/L (ref 3.5–5.2)
Sodium: 140 mmol/L (ref 134–144)
Total Protein: 7.2 g/dL (ref 6.0–8.5)
eGFR: 66 mL/min/{1.73_m2} (ref >59)

## 2021-06-26 LAB — CBC WITH DIFFERENTIAL/PLATELET
Basophils Absolute: 0 10*3/uL (ref 0.0–0.2)
Basos: 0 %
EOS (ABSOLUTE): 0.2 10*3/uL (ref 0.0–0.4)
Eos: 3 %
Hematocrit: 43.4 % (ref 37.5–51.0)
Hemoglobin: 14.3 g/dL (ref 13.0–17.7)
Immature Grans (Abs): 0 10*3/uL (ref 0.0–0.1)
Immature Granulocytes: 0 %
Lymphocytes Absolute: 2.7 10*3/uL (ref 0.7–3.1)
Lymphs: 30 %
MCH: 28.3 pg (ref 26.6–33.0)
MCHC: 32.9 g/dL (ref 31.5–35.7)
MCV: 86 fL (ref 79–97)
Monocytes Absolute: 0.8 10*3/uL (ref 0.1–0.9)
Monocytes: 9 %
Neutrophils Absolute: 5.3 10*3/uL (ref 1.4–7.0)
Neutrophils: 58 %
Platelets: 313 10*3/uL (ref 150–450)
RBC: 5.05 x10E6/uL (ref 4.14–5.80)
RDW: 13.9 % (ref 11.6–15.4)
WBC: 9 10*3/uL (ref 3.4–10.8)

## 2021-06-26 LAB — LIPID PANEL
Chol/HDL Ratio: 3.3 ratio (ref 0.0–5.0)
Cholesterol, Total: 124 mg/dL (ref 100–199)
HDL: 38 mg/dL — ABNORMAL LOW (ref >39)
LDL Chol Calc (NIH): 62 mg/dL (ref 0–99)
Triglycerides: 135 mg/dL (ref 0–149)
VLDL Cholesterol Cal: 24 mg/dL (ref 5–40)

## 2021-06-26 NOTE — Progress Notes (Signed)
Patient returning call. Please call back

## 2021-08-05 ENCOUNTER — Telehealth: Payer: Self-pay | Admitting: Nurse Practitioner

## 2021-08-05 DIAGNOSIS — E782 Mixed hyperlipidemia: Secondary | ICD-10-CM

## 2021-08-05 DIAGNOSIS — E876 Hypokalemia: Secondary | ICD-10-CM

## 2021-08-05 DIAGNOSIS — K219 Gastro-esophageal reflux disease without esophagitis: Secondary | ICD-10-CM

## 2021-08-05 DIAGNOSIS — E1142 Type 2 diabetes mellitus with diabetic polyneuropathy: Secondary | ICD-10-CM

## 2021-08-05 DIAGNOSIS — I10 Essential (primary) hypertension: Secondary | ICD-10-CM

## 2021-08-05 DIAGNOSIS — G8929 Other chronic pain: Secondary | ICD-10-CM

## 2021-08-05 MED ORDER — GABAPENTIN 100 MG PO CAPS: ORAL_CAPSULE | ORAL | 1 refills | Status: DC

## 2021-08-05 MED ORDER — POTASSIUM CITRATE ER 15 MEQ (1620 MG) PO TBCR: 2.0000 | EXTENDED_RELEASE_TABLET | Freq: Two times a day (BID) | ORAL | 1 refills | Status: DC

## 2021-08-05 MED ORDER — GLIPIZIDE ER 5 MG PO TB24: 5.0000 mg | ORAL_TABLET | Freq: Every day | ORAL | 1 refills | Status: DC

## 2021-08-05 MED ORDER — LISINOPRIL 10 MG PO TABS: 10.0000 mg | ORAL_TABLET | Freq: Every day | ORAL | 1 refills | Status: DC

## 2021-08-05 MED ORDER — OMEPRAZOLE 40 MG PO CPDR: 40.0000 mg | DELAYED_RELEASE_CAPSULE | Freq: Two times a day (BID) | ORAL | 1 refills | Status: DC

## 2021-08-05 MED ORDER — METFORMIN HCL 1000 MG PO TABS: 1000.0000 mg | ORAL_TABLET | Freq: Two times a day (BID) | ORAL | 1 refills | Status: DC

## 2021-08-05 MED ORDER — ATORVASTATIN CALCIUM 10 MG PO TABS: 10.0000 mg | ORAL_TABLET | Freq: Every day | ORAL | 1 refills | Status: DC

## 2021-08-05 NOTE — Telephone Encounter (Signed)
Pts insurance changed. Needs all of his Rx's sent to [facility]. Can fax to [phone]

## 2021-08-05 NOTE — Telephone Encounter (Signed)
Pt aware medications sent to [place]

## 2021-12-28 ENCOUNTER — Ambulatory Visit: Payer: BC Managed Care – PPO | Admitting: Nurse Practitioner

## 2021-12-28 ENCOUNTER — Encounter: Payer: Self-pay | Admitting: Nurse Practitioner

## 2021-12-28 VITALS — BP 120/81 | HR 82 | Temp 98.7°F | Resp 20 | Ht 67.0 in | Wt 191.0 lb

## 2021-12-28 DIAGNOSIS — M549 Dorsalgia, unspecified: Secondary | ICD-10-CM

## 2021-12-28 DIAGNOSIS — Z125 Encounter for screening for malignant neoplasm of prostate: Secondary | ICD-10-CM

## 2021-12-28 DIAGNOSIS — E1142 Type 2 diabetes mellitus with diabetic polyneuropathy: Secondary | ICD-10-CM

## 2021-12-28 DIAGNOSIS — E782 Mixed hyperlipidemia: Secondary | ICD-10-CM

## 2021-12-28 DIAGNOSIS — E876 Hypokalemia: Secondary | ICD-10-CM | POA: Diagnosis not present

## 2021-12-28 DIAGNOSIS — G8929 Other chronic pain: Secondary | ICD-10-CM

## 2021-12-28 DIAGNOSIS — K219 Gastro-esophageal reflux disease without esophagitis: Secondary | ICD-10-CM

## 2021-12-28 DIAGNOSIS — Z6829 Body mass index (BMI) 29.0-29.9, adult: Secondary | ICD-10-CM

## 2021-12-28 DIAGNOSIS — I10 Essential (primary) hypertension: Secondary | ICD-10-CM

## 2021-12-28 DIAGNOSIS — I495 Sick sinus syndrome: Secondary | ICD-10-CM

## 2021-12-28 MED ORDER — OMEPRAZOLE 40 MG PO CPDR: 40.0000 mg | DELAYED_RELEASE_CAPSULE | Freq: Two times a day (BID) | ORAL | 1 refills | Status: AC

## 2021-12-28 MED ORDER — POTASSIUM CITRATE ER 15 MEQ (1620 MG) PO TBCR: 2.0000 | EXTENDED_RELEASE_TABLET | Freq: Two times a day (BID) | ORAL | 1 refills | Status: AC

## 2021-12-28 MED ORDER — GABAPENTIN 100 MG PO CAPS: ORAL_CAPSULE | ORAL | 1 refills | Status: AC

## 2021-12-28 MED ORDER — LISINOPRIL 10 MG PO TABS: 10.0000 mg | ORAL_TABLET | Freq: Every day | ORAL | 1 refills | Status: AC

## 2021-12-28 MED ORDER — METFORMIN HCL 1000 MG PO TABS: 1000.0000 mg | ORAL_TABLET | Freq: Two times a day (BID) | ORAL | 1 refills | Status: AC

## 2021-12-28 MED ORDER — ATORVASTATIN CALCIUM 10 MG PO TABS: 10.0000 mg | ORAL_TABLET | Freq: Every day | ORAL | 1 refills | Status: AC

## 2021-12-28 MED ORDER — GLIPIZIDE ER 5 MG PO TB24: 5.0000 mg | ORAL_TABLET | Freq: Every day | ORAL | 1 refills | Status: AC

## 2021-12-28 NOTE — Progress Notes (Signed)
Subjective:    Patient ID: Cameron Noble, male    DOB: 07-09-1960, 61 y.o.   MRN: [id number]   Chief Complaint: medical management of chronic issues     HPI:  Cameron Noble is a 61 y.o. who identifies as a male who was assigned male at birth.   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 Essential hypertension, benign No c/o chest pain, sob or headache. Does not cheeck blood pressure at home. BP Readings from Last 3 Encounters:  06/25/21 109/71  03/20/21 137/85  02/02/21 135/87     2. Mixed hyperlipidemia Eats whatever his family brings him  to eat Lab Results  Component Value Date   CHOL 124 06/25/2021   HDL 38 (L) 06/25/2021   LDLCALC 62 06/25/2021   TRIG 135 06/25/2021   CHOLHDL 3.3 06/25/2021     3. Sinoatrial node dysfunction (HCC) Denies any palpitations or heart racing. Has not seen cardiology in several years.  4. Hypokalemia No c/o muscle cramps Lab Results  Component Value Date   K 4.5 06/25/2021     5. Type 2 diabetes mellitus with diabetic polyneuropathy, without long-term current use of insulin (HCC) Patient has not been checking his blood sugars at h ime. Lab Results  Component Value Date   HGBA1C 5.7 (H) 06/25/2021     6. Gastroesophageal reflux disease without esophagitis Takes omeprazole daily and denies any symptoms.  7. Chronic back pain, unspecified back location, unspecified back pain laterality Doing well right now- tylenol helps rates pain 6/10 currently  8. BMI 29.0-29.9,adult Weight is down 10 lbs Wt Readings from Last 3 Encounters:  12/28/21 191 lb (86.6 kg)  06/25/21 201 lb (91.2 kg)  03/20/21 201 lb 6.4 oz (91.4 kg)   BMI Readings from Last 3 Encounters:  12/28/21 29.91 kg/m  06/25/21 31.48 kg/m  03/20/21 31.54 kg/m     New complaints: None today  No Known Allergies Outpatient Encounter Medications as of  12/28/2021  Medication Sig   aspirin 81 MG tablet Take 81 mg by mouth daily.   atorvastatin (LIPITOR) 10 MG tablet Take 1 tablet (10 mg total) by mouth daily.   Cholecalciferol (VITAMIN D-3 PO) Take 1 tablet by mouth daily.   ciclopirox (PENLAC) 8 % solution Apply topically at bedtime. Apply over nail and surrounding skin. Apply daily over previous coat. After seven (7) days, may remove with alcohol and continue cycle.   fish oil-omega-3 fatty acids 1000 MG capsule Take 2 g by mouth daily.   gabapentin (NEURONTIN) 100 MG capsule TAKE 1 TO 2 CAPSULES BY MOUTH AT BEDTIME   glipiZIDE (GLUCOTROL XL) 5 MG 24 hr tablet Take 1 tablet (5 mg total) by mouth daily with breakfast.   lisinopril (ZESTRIL) 10 MG tablet Take 1 tablet (10 mg total) by mouth daily.   meloxicam (MOBIC) 7.5 MG tablet TAKE 1 TABLET DAILY   metFORMIN (GLUCOPHAGE) 1000 MG tablet Take 1 tablet (1,000 mg total) by mouth 2 (two) times daily with a meal.   methocarbamol (ROBAXIN) 500 MG tablet Take 1 tablet (500 mg total) by mouth 4 (four) times daily.   omeprazole (PRILOSEC) 40 MG capsule Take 1 capsule (40 mg total) by mouth in the morning and at bedtime.   Potassium Citrate 15 MEQ (1620 MG) TBCR Take 2 tablets by mouth 2 (two) times daily.   No facility-administered encounter medications on file as of 12/28/2021.  Past Surgical History:  Procedure Laterality Date   BACK SURGERY  03/2002   Dr. Trenton Gammon    CYSTOSCOPY WITH STENT PLACEMENT Left 01/27/2015   Procedure: CYSTOSCOPY, RETROGRADE, WITH LEFT STENT PLACEMENT;  Surgeon: Irine Seal, MD;  Location: [facility];  Service: Urology;  Laterality: Left;  I have 2 cases at [facility] and a foley to place at [facility].  I would guess it will be 830-9 before I get up to [facility].    DDD C-Spine Repair     KNEE ARTHROSCOPY Right     Family History  Problem Relation Age of Onset   Diabetes Mother    Hypertension Mother    Colon cancer Neg Hx    Esophageal cancer Neg Hx    Rectal cancer Neg Hx    Stomach  cancer Neg Hx       Controlled substance contract: n/a     Review of Systems  Constitutional:  Negative for diaphoresis.  Eyes:  Negative for pain.  Respiratory:  Negative for shortness of breath.   Cardiovascular:  Negative for chest pain, palpitations and leg swelling.  Gastrointestinal:  Negative for abdominal pain.  Endocrine: Negative for polydipsia.  Skin:  Negative for rash.  Neurological:  Negative for dizziness, weakness and headaches.  Hematological:  Does not bruise/bleed easily.  All other systems reviewed and are negative.      Objective:   Physical Exam Vitals and nursing note reviewed.  Constitutional:      Appearance: Normal appearance. He is well-developed.  HENT:     Head: Normocephalic.     Nose: Nose normal.     Mouth/Throat:     Mouth: Mucous membranes are moist.     Pharynx: Oropharynx is clear.  Eyes:     Pupils: Pupils are equal, round, and reactive to light.  Neck:     Thyroid: No thyroid mass or thyromegaly.     Vascular: No carotid bruit or JVD.     Trachea: Phonation normal.  Cardiovascular:     Rate and Rhythm: Normal rate and regular rhythm.  Pulmonary:     Effort: Pulmonary effort is normal. No respiratory distress.     Breath sounds: Normal breath sounds.  Abdominal:     General: Bowel sounds are normal.     Palpations: Abdomen is soft.     Tenderness: There is no abdominal tenderness.  Musculoskeletal:        General: Normal range of motion.     Cervical back: Normal range of motion and neck supple.  Lymphadenopathy:     Cervical: No cervical adenopathy.  Skin:    General: Skin is warm and dry.  Neurological:     Mental Status: He is alert and oriented to person, place, and time.  Psychiatric:        Behavior: Behavior normal.        Thought Content: Thought content normal.        Judgment: Judgment normal.     BP 120/81   Pulse 82   Temp 98.7 F (37.1 C) (Temporal)   Resp 20   Ht _0  (1.702 m)   Wt 191 lb  (86.6 kg)   SpO2 99%   BMI 29.91 kg/m   HGBA1c 6.8%      Assessment & Plan:   Cameron Noble comes in today with chief complaint of Medical Management of Chronic Issues   Diagnosis and orders addressed:  1. Essential hypertension, benign Low sodium diet - CBC with Differential/Platelet -  CMP14+EGFR - lisinopril (ZESTRIL) 10 MG tablet; Take 1 tablet (10 mg total) by mouth daily.  Dispense: 90 tablet; Refill: 1  2. Mixed hyperlipidemia Low fat diet - Lipid panel - atorvastatin (LIPITOR) 10 MG tablet; Take 1 tablet (10 mg total) by mouth daily.  Dispense: 90 tablet; Refill: 1  3. Sinoatrial node dysfunction (HCC)  4. Hypokalemia Labs pending - Potassium Citrate 15 MEQ (1620 MG) TBCR; Take 2 tablets by mouth 2 (two) times daily.  Dispense: 360 tablet; Refill: 1  5. Type 2 diabetes mellitus with diabetic polyneuropathy, without long-term current use of insulin (HCC) Continue to watch carbs in diet - Bayer DCA Hb A1c Waived - glipiZIDE (GLUCOTROL XL) 5 MG 24 hr tablet; Take 1 tablet (5 mg total) by mouth daily with breakfast.  Dispense: 90 tablet; Refill: 1 - metFORMIN (GLUCOPHAGE) 1000 MG tablet; Take 1 tablet (1,000 mg total) by mouth 2 (two) times daily with a meal.  Dispense: 180 tablet; Refill: 1  6. Gastroesophageal reflux disease without esophagitis Avoid spicy foods Do not eat 2 hours prior to bedtime  - omeprazole (PRILOSEC) 40 MG capsule; Take 1 capsule (40 mg total) by mouth in the morning and at bedtime.  Dispense: 180 capsule; Refill: 1  7. Chronic back pain, unspecified back location, unspecified back pain laterality Moist heat rest - gabapentin (NEURONTIN) 100 MG capsule; TAKE 1 TO 2 CAPSULES BY MOUTH AT BEDTIME  Dispense: 180 capsule; Refill: 1  8. BMI 29.0-29.9,adult Discussed diet and exercise for person with BMI >25 Will recheck weight in 3-6 months   9. Prostate cancer screening - PSA, total and free   Labs pending Health Maintenance  reviewed Diet and exercise encouraged  Follow up plan: 6 months   [place], FNP

## 2021-12-28 NOTE — Patient Instructions (Signed)

## 2021-12-29 LAB — CBC WITH DIFFERENTIAL/PLATELET
Basophils Absolute: 0.1 10*3/uL (ref 0.0–0.2)
Basos: 0 %
EOS (ABSOLUTE): 0.1 10*3/uL (ref 0.0–0.4)
Eos: 0 %
Hematocrit: 45.2 % (ref 37.5–51.0)
Hemoglobin: 15 g/dL (ref 13.0–17.7)
Immature Grans (Abs): 0 10*3/uL (ref 0.0–0.1)
Immature Granulocytes: 0 %
Lymphocytes Absolute: 2.6 10*3/uL (ref 0.7–3.1)
Lymphs: 15 %
MCH: 28.2 pg (ref 26.6–33.0)
MCHC: 33.2 g/dL (ref 31.5–35.7)
MCV: 85 fL (ref 79–97)
Monocytes Absolute: 1.1 10*3/uL — ABNORMAL HIGH (ref 0.1–0.9)
Monocytes: 6 %
Neutrophils Absolute: 13.6 10*3/uL — ABNORMAL HIGH (ref 1.4–7.0)
Neutrophils: 79 %
Platelets: 294 10*3/uL (ref 150–450)
RBC: 5.32 x10E6/uL (ref 4.14–5.80)
RDW: 13 % (ref 11.6–15.4)
WBC: 17.4 10*3/uL — ABNORMAL HIGH (ref 3.4–10.8)

## 2021-12-29 LAB — PSA, TOTAL AND FREE
PSA, Free Pct: 21.4 %
PSA, Free: 0.6 ng/mL
Prostate Specific Ag, Serum: 2.8 ng/mL (ref 0.0–4.0)

## 2021-12-29 LAB — CMP14+EGFR
ALT: 11 IU/L (ref 0–44)
AST: 15 IU/L (ref 0–40)
Albumin/Globulin Ratio: 1.3 (ref 1.2–2.2)
Albumin: 4.3 g/dL (ref 3.9–4.9)
Alkaline Phosphatase: 120 IU/L (ref 44–121)
BUN/Creatinine Ratio: 8 — ABNORMAL LOW (ref 10–24)
BUN: 10 mg/dL (ref 8–27)
Bilirubin Total: 0.5 mg/dL (ref 0.0–1.2)
CO2: 25 mmol/L (ref 20–29)
Calcium: 10 mg/dL (ref 8.6–10.2)
Chloride: 101 mmol/L (ref 96–106)
Creatinine, Ser: 1.27 mg/dL (ref 0.76–1.27)
Globulin, Total: 3.3 g/dL (ref 1.5–4.5)
Glucose: 54 mg/dL — ABNORMAL LOW (ref 70–99)
Potassium: 4.4 mmol/L (ref 3.5–5.2)
Sodium: 142 mmol/L (ref 134–144)
Total Protein: 7.6 g/dL (ref 6.0–8.5)
eGFR: 63 mL/min/{1.73_m2} (ref >59)

## 2021-12-29 LAB — LIPID PANEL
Chol/HDL Ratio: 2.9 ratio (ref 0.0–5.0)
Cholesterol, Total: 110 mg/dL (ref 100–199)
HDL: 38 mg/dL — ABNORMAL LOW (ref >39)
LDL Chol Calc (NIH): 56 mg/dL (ref 0–99)
Triglycerides: 82 mg/dL (ref 0–149)
VLDL Cholesterol Cal: 16 mg/dL (ref 5–40)

## 2021-12-29 LAB — BAYER DCA HB A1C WAIVED: HB A1C (BAYER DCA - WAIVED): 6.8 % — ABNORMAL HIGH (ref 4.8–5.6)

## 2022-02-22 ENCOUNTER — Telehealth: Payer: Self-pay | Admitting: Nurse Practitioner

## 2022-02-22 ENCOUNTER — Other Ambulatory Visit: Payer: PRIVATE HEALTH INSURANCE

## 2022-02-22 ENCOUNTER — Other Ambulatory Visit: Payer: Self-pay

## 2022-02-22 DIAGNOSIS — D72829 Elevated white blood cell count, unspecified: Secondary | ICD-10-CM

## 2022-02-22 MED ORDER — MELOXICAM 7.5 MG PO TABS: 7.5000 mg | ORAL_TABLET | Freq: Every day | ORAL | 1 refills | Status: DC

## 2022-02-22 NOTE — Telephone Encounter (Signed)
  Prescription Request  02/22/2022  Is this a "Controlled Substance" medicine? no  Have you seen your PCP in the last 2 weeks? 12/23  If YES, route message to pool  -  If NO, patient needs to be scheduled for appointment.  What is the name of the medication or equipment? meloxicam  Have you contacted your pharmacy to request a refill? yes Which pharmacy would you like this sent to? [facility]   Patient notified that their request is being sent to the clinical staff for review and that they should receive a response within 2 business days.

## 2022-02-22 NOTE — Telephone Encounter (Signed)
Pt aware refill sent to pharmacy 

## 2022-03-09 ENCOUNTER — Telehealth: Payer: Self-pay | Admitting: Nurse Practitioner

## 2022-03-09 DIAGNOSIS — E782 Mixed hyperlipidemia: Secondary | ICD-10-CM

## 2022-03-09 DIAGNOSIS — I10 Essential (primary) hypertension: Secondary | ICD-10-CM

## 2022-03-09 DIAGNOSIS — E1142 Type 2 diabetes mellitus with diabetic polyneuropathy: Secondary | ICD-10-CM

## 2022-03-09 DIAGNOSIS — D72829 Elevated white blood cell count, unspecified: Secondary | ICD-10-CM

## 2022-03-09 NOTE — Telephone Encounter (Signed)
Patient needs his lab order sent to [facility] in [place]. Patient has new insurance that we are not in network with but [facility] is.

## 2022-03-09 NOTE — Telephone Encounter (Signed)
Labs ordered and printed out. Patient contacted and advised that orders up front and ready for pick up. Patient verbalized understanding

## 2022-05-09 ENCOUNTER — Other Ambulatory Visit: Payer: Self-pay | Admitting: Nurse Practitioner

## 2022-07-01 ENCOUNTER — Ambulatory Visit: Payer: BC Managed Care – PPO | Admitting: Nurse Practitioner

## 2022-08-28 ENCOUNTER — Other Ambulatory Visit: Payer: Self-pay | Admitting: Nurse Practitioner

## 2022-08-28 DIAGNOSIS — E876 Hypokalemia: Secondary | ICD-10-CM

## 2022-08-30 NOTE — Addendum Note (Signed)
Addended by: Julious Payer D on: 08/30/2022 08:28 AM   Modules accepted: Orders

## 2022-10-14 ENCOUNTER — Other Ambulatory Visit: Payer: Self-pay | Admitting: Nurse Practitioner

## 2023-11-24 ENCOUNTER — Encounter: Payer: Self-pay | Admitting: Family Medicine
